# Patient Record
Sex: Male | Born: 1950 | Hispanic: Yes | Marital: Single | Smoking: Current every day smoker
Health system: Southern US, Community
[De-identification: ages and names within clinical notes are randomized; demographics above are authoritative.]

## PROBLEM LIST (undated history)

## (undated) DIAGNOSIS — E119 Type 2 diabetes mellitus without complications: Secondary | ICD-10-CM

## (undated) DIAGNOSIS — H539 Unspecified visual disturbance: Secondary | ICD-10-CM

## (undated) DIAGNOSIS — I10 Essential (primary) hypertension: Secondary | ICD-10-CM

## (undated) DIAGNOSIS — E785 Hyperlipidemia, unspecified: Secondary | ICD-10-CM

## (undated) DIAGNOSIS — G4730 Sleep apnea, unspecified: Secondary | ICD-10-CM

## (undated) DIAGNOSIS — E7800 Pure hypercholesterolemia, unspecified: Secondary | ICD-10-CM

## (undated) DIAGNOSIS — M069 Rheumatoid arthritis, unspecified: Secondary | ICD-10-CM

## (undated) DIAGNOSIS — M1990 Unspecified osteoarthritis, unspecified site: Secondary | ICD-10-CM

## (undated) DIAGNOSIS — G5600 Carpal tunnel syndrome, unspecified upper limb: Secondary | ICD-10-CM

## (undated) DIAGNOSIS — G7000 Myasthenia gravis without (acute) exacerbation: Secondary | ICD-10-CM

## (undated) HISTORY — DX: Type 2 diabetes mellitus without complications: E11.9

## (undated) HISTORY — DX: Unspecified visual disturbance: H53.9

## (undated) HISTORY — DX: Hyperlipidemia, unspecified: E78.5

## (undated) HISTORY — DX: Essential (primary) hypertension: I10

## (undated) HISTORY — PX: FRACTURE SURGERY: SHX138

## (undated) HISTORY — DX: Sleep apnea, unspecified: G47.30

## (undated) HISTORY — DX: Rheumatoid arthritis, unspecified: M06.9

## (undated) HISTORY — DX: Unspecified osteoarthritis, unspecified site: M19.90

## (undated) HISTORY — PX: KNEE SURGERY: SHX244

## (undated) HISTORY — DX: Myasthenia gravis without (acute) exacerbation: G70.00

## (undated) HISTORY — DX: Pure hypercholesterolemia, unspecified: E78.00

---

## 2004-04-03 ENCOUNTER — Ambulatory Visit: Admission: RE | Admit: 2004-04-03 | Payer: Self-pay | Source: Ambulatory Visit

## 2012-11-27 NOTE — Op Note (Unsigned)
DATE OF BIRTH:                        11/02/1951      ADMISSION DATE:                     04/03/2004            PATIENT LOCATION:                     [id number]            DATE OF PROCEDURE:                   04/03/2004      SURGEON:                            Verdell Carmine, MD      ASSISTANT(S):                  ANESTHESIOLOGIST:  Marylouise Stacks, M.D.            PREOPERATIVE DIAGNOSIS:  SCREENING COLONOSCOPY.            POSTOPERATIVE DIAGNOSES      1.   DIVERTICULOSIS.      2.   APPENDICEAL ORIFICE INFLAMMATION.            PROCEDURE:  COLONOSCOPY WITH BIOPSY OF APPENDICEAL ORIFICE.            ANESTHESIA:  IVA.            INDICATIONS FOR PROCEDURE:  This is a 62 year old black male who has been      counseled regarding screening colonoscopy.  The risks, complications, and      alternatives as well as miss rates of colonoscopy were explained to the      patient preoperatively.            DESCRIPTION OF PROCEDURE:  After appropriate cardiopulmonary and oxygen      saturation monitoring had been established the patient was placed in the      left lateral decubitus position and an Olympus pediatric fiberoptic      colonoscope was advanced to the level of the cecum with ease.  Prep was      adequate through most of the colon although a large amount of liquid prep      required aspiration.            The ileocecal valve was identified and the appendiceal orifice appeared to      be somewhat inflamed with some edema and friability of its orifice.  This      was biopsied in two areas to exclude the possibility of an inflamed sessile      polyp at the appendiceal orifice.  The scope was spirally withdrawn and the      remaining cecum, right colon, hepatic flexure, and transverse colon were      within normal limits.            In the proximal descending colon, scattered diverticula were encountered      which were seen throughout the remaining descending colon and rectosigmoid.      There was no evidence of diverticulitis or  luminal stricture.  The rectum      was within normal limits.  The anal canal had small internal hemorrhoids      which  were left undisturbed.            The patient tolerated the procedure well and was taken to the PACU in      stable condition.                                                ___________________________________          Date Signed: __________      Verdell Carmine, MD  ([id number])            D: 04/03/2004 by Verdell Carmine, MD      T: 04/04/2004 by [id number] ([id number]) ([id number])      cc:  Deanne Coffer, MD          Verdell Carmine, MD

## 2013-11-25 HISTORY — PX: PENILE PROSTHESIS IMPLANT: SHX240

## 2014-01-24 ENCOUNTER — Ambulatory Visit: Payer: Commercial Managed Care - POS | Admitting: Anesthesiology

## 2014-01-24 ENCOUNTER — Ambulatory Visit: Payer: Self-pay

## 2014-01-24 ENCOUNTER — Encounter: Payer: Self-pay | Admitting: Anesthesiology

## 2014-01-24 ENCOUNTER — Encounter
Admission: RE | Disposition: A | Discharge status: Home / Self Care | Payer: Self-pay | Source: Ambulatory Visit | Attending: Gastroenterology

## 2014-01-24 ENCOUNTER — Ambulatory Visit: Payer: Commercial Managed Care - POS | Admitting: Gastroenterology

## 2014-01-24 ENCOUNTER — Ambulatory Visit
Admission: RE | Admit: 2014-01-24 | Discharge: 2014-01-24 | Disposition: A | Discharge status: Home / Self Care | Payer: Commercial Managed Care - POS | Source: Ambulatory Visit | Attending: Gastroenterology | Admitting: Gastroenterology

## 2014-01-24 DIAGNOSIS — K2980 Duodenitis without bleeding: Secondary | ICD-10-CM | POA: Insufficient documentation

## 2014-01-24 DIAGNOSIS — K921 Melena: Secondary | ICD-10-CM | POA: Insufficient documentation

## 2014-01-24 DIAGNOSIS — K2960 Other gastritis without bleeding: Secondary | ICD-10-CM | POA: Insufficient documentation

## 2014-01-24 DIAGNOSIS — K5730 Diverticulosis of large intestine without perforation or abscess without bleeding: Secondary | ICD-10-CM | POA: Insufficient documentation

## 2014-01-24 DIAGNOSIS — K319 Disease of stomach and duodenum, unspecified: Secondary | ICD-10-CM

## 2014-01-24 DIAGNOSIS — K209 Esophagitis, unspecified without bleeding: Secondary | ICD-10-CM | POA: Insufficient documentation

## 2014-01-24 DIAGNOSIS — K648 Other hemorrhoids: Secondary | ICD-10-CM | POA: Insufficient documentation

## 2014-01-24 DIAGNOSIS — D649 Anemia, unspecified: Secondary | ICD-10-CM | POA: Insufficient documentation

## 2014-01-24 HISTORY — PX: EGD, COLONOSCOPY: SHX3799

## 2014-01-24 LAB — GLUCOSE WHOLE BLOOD - POCT: Whole Blood Glucose POCT: 150 mg/dL — ABNORMAL HIGH (ref 70–100)

## 2014-01-24 SURGERY — EGD, COLONOSCOPY
Anesthesia: Anesthesia MAC / Sedation | Site: Abdomen | Wound class: Clean Contaminated

## 2014-01-24 MED ORDER — PROPOFOL 10 MG/ML IV EMUL
INTRAVENOUS | Status: AC
Start: 2014-01-24 — End: ?
  Filled 2014-01-24: qty 20

## 2014-01-24 MED ORDER — PROMETHAZINE HCL 25 MG/ML IJ SOLN
6.2500 mg | Freq: Once | INTRAMUSCULAR | Status: DC | PRN
Start: 2014-01-24 — End: 2014-01-24

## 2014-01-24 MED ORDER — PROPOFOL 10 MG/ML IV EMUL
INTRAVENOUS | Status: AC
Start: 2014-01-24 — End: ?
  Filled 2014-01-24: qty 40

## 2014-01-24 MED ORDER — HYDROMORPHONE HCL PF 1 MG/ML IJ SOLN
0.5000 mg | INTRAMUSCULAR | Status: DC | PRN
Start: 2014-01-24 — End: 2014-01-24

## 2014-01-24 MED ORDER — ONDANSETRON HCL 4 MG/2ML IJ SOLN
4.0000 mg | Freq: Once | INTRAMUSCULAR | Status: DC | PRN
Start: 2014-01-24 — End: 2014-01-24

## 2014-01-24 MED ORDER — MEPERIDINE HCL 25 MG/ML IJ SOLN
12.5000 mg | Freq: Once | INTRAMUSCULAR | Status: DC | PRN
Start: 2014-01-24 — End: 2014-01-24

## 2014-01-24 MED ORDER — ESOMEPRAZOLE MAGNESIUM 20 MG PO CPDR
40.0000 mg | DELAYED_RELEASE_CAPSULE | Freq: Every morning | ORAL | Status: AC
Start: 2014-01-24 — End: ?

## 2014-01-24 MED ORDER — FENTANYL CITRATE 0.05 MG/ML IJ SOLN
INTRAMUSCULAR | Status: AC
Start: 2014-01-24 — End: ?
  Filled 2014-01-24: qty 2

## 2014-01-24 MED ORDER — OXYCODONE-ACETAMINOPHEN 5-325 MG PO TABS
1.0000 | ORAL_TABLET | Freq: Once | ORAL | Status: DC | PRN
Start: 2014-01-24 — End: 2014-01-24

## 2014-01-24 MED ORDER — SODIUM CHLORIDE 0.9 % IV SOLN
INTRAVENOUS | Status: DC
Start: 2014-01-24 — End: 2014-01-24

## 2014-01-24 MED ORDER — LIDOCAINE HCL 2 % IJ SOLN
INTRAMUSCULAR | Status: AC
Start: 2014-01-24 — End: ?
  Filled 2014-01-24: qty 20

## 2014-01-24 MED ORDER — FENTANYL CITRATE 0.05 MG/ML IJ SOLN
50.0000 ug | INTRAMUSCULAR | Status: DC | PRN
Start: 2014-01-24 — End: 2014-01-24

## 2014-01-24 MED ORDER — LIDOCAINE HCL 2 % IJ SOLN
INTRAMUSCULAR | Status: DC | PRN
Start: 2014-01-24 — End: 2014-01-24
  Administered 2014-01-24: 80 mg via INTRAVENOUS

## 2014-01-24 MED ORDER — FENTANYL CITRATE 0.05 MG/ML IJ SOLN
INTRAMUSCULAR | Status: DC | PRN
Start: 2014-01-24 — End: 2014-01-24
  Administered 2014-01-24 (×2): 50 ug via INTRAVENOUS

## 2014-01-24 MED ORDER — PROPOFOL INFUSION 10 MG/ML
INTRAVENOUS | Status: DC | PRN
Start: 2014-01-24 — End: 2014-01-24
  Administered 2014-01-24: 30 mg via INTRAVENOUS
  Administered 2014-01-24: 50 mg via INTRAVENOUS
  Administered 2014-01-24: 40 mg via INTRAVENOUS
  Administered 2014-01-24 (×4): 20 mg via INTRAVENOUS
  Administered 2014-01-24: 40 mg via INTRAVENOUS

## 2014-01-24 SURGICAL SUPPLY — 26 items
BLOCK BITE MAXI 60FR LF STRD STRAP SDPRT (Procedure Accessories) ×1
BLOCK BITE OD60 FR STURDY STRAP SIDEPORT (Procedure Accessories) ×1 IMPLANT
BLOCK BITE OD60 FR STURDY STRAP SIDEPORT DENTAL RETENTION RIM MAXI (Procedure Accessories) ×1 IMPLANT
FORCEPS BIOPSY L240 CM JUMBO MICROMESH (Instrument) IMPLANT
FORCEPS BIOPSY L240 CM JUMBO MICROMESH TEETH STREAMLINE CATHETER (Instrument) IMPLANT
FORCEPS BIOPSY L240 CM LARGE CAPACITY (Instrument) IMPLANT
FORCEPS BIOPSY L240 CM MICROMESH TEETH STREAMLINE CATHETER NEEDLE (Instrument) IMPLANT
FORCEPS BX SS JMB RJ 4 2.8MM 240CM STRL (Instrument)
FORCEPS BX SS LG CPC RJ 4 2.4MM 240CM (Instrument)
GLOVES EXAM NITRILE ETS LG NS (Glove) ×2 IMPLANT
GOWN ISL PP PE REG LG LF FULL BCK NK TIE (Gown) ×4 IMPLANT
GOWN ISOLATION REGULAR LARGE FULL BACK NECK TIE ELASTIC CUFF (Gown) ×2 IMPLANT
MASK FLUID SHIELD W WRAP (Personal Protection) ×4 IMPLANT
NEEDLE CARR-LOCKE INJECT 25GX5 (Needles) IMPLANT
PAD ELECTROSRG GRND REM W CRD (Procedure Accessories) IMPLANT
SNARE ESCP MIC CPTVTR 13MM 240IN STRL (GE Lab Supplies) IMPLANT
SNARE SMALL HEXAGON CAPTIVATOR STIFF ENDOSCOPIC POLYPECTOMY (GE Lab Supplies) IMPLANT
SPONGE GAUZE L4 IN X W4 IN 16 PLY (Dressing) ×1 IMPLANT
SPONGE GAUZE L4 IN X W4 IN 16 PLY MAXIMUM ABSORBENT USP TYPE VII (Dressing) ×1 IMPLANT
SPONGE GZE CTTN CRTY 4X4IN LF NS 16 PLY (Dressing) ×1
SYRINGE 50 ML GRADUATE NONPYROGENIC DEHP (Syringes, Needles) ×1 IMPLANT
SYRINGE 50 ML GRADUATE NONPYROGENIC DEHP FREE PVC FREE BD MEDICAL (Syringes, Needles) ×1 IMPLANT
SYRINGE MED 50ML LF STRL GRAD N-PYRG (Syringes, Needles) ×1
TRAP  MUCOUS SPECIMEN 40CC (Procedure Accessories) IMPLANT
WATER STERILE PLASTIC POUR BOTTLE 250 ML (Irrigation Solutions) ×1 IMPLANT
WATER STRL 250ML LF PLS PR BTL (Irrigation Solutions) ×1

## 2014-01-24 NOTE — Transfer of Care (Signed)
Anesthesia Transfer of Care Note    Patient: Caleb Koch    Procedures performed: Procedure(s) with comments:  EGD, COLONOSCOPY - EGD/Colonoscopy  Q1 = Unknown    Anesthesia type: General TIVA    Patient location:Phase II PACU    Post pain: Patient not complaining of pain, continue current therapy      Mental Status:awake and alert     Respiratory Function: tolerating room air    Cardiovascular: stable    Nausea/Vomiting: patient not complaining of nausea or vomiting    Hydration Status: adequate    Post assessment: no apparent anesthetic complications and no reportable events  Pt to PACU, breathing spontaneously, VSS, in NAD. Report to PACU RN.

## 2014-01-24 NOTE — H&P (Signed)
GI PRE PROCEDURE NOTE    Proceduralist Comments:   Review of Systems and Past Medical / Surgical History performed: Yes     Indications:anemia and Blood in stool    Previous Adverse Reaction to Anesthesia or Sedation (if yes, describe): No    Physical Exam / Laboratory Data (If applicable)   Airway Classification: Class II    General: Alert and cooperative  Lungs: Lungs clear to auscultation  Cardiac: RRR, normal S1S2.    Abdomen: Soft, non tender. Normal active bowel sounds  Other:     No labs drawn    American Society of Anesthesiologists (ASA) Physical Status Classification:   ASA 2 - Patient with mild systemic disease with no functional limitations    Planned Sedation:   Deep sedation with anesthesia    Attestation:   Caleb Koch has been reassessed immediately prior to the procedure and is an appropriate candidate for the planned sedation and procedure. Risks, benefits and alternatives to the planned procedure and sedation have been explained to the patient or guardian:  yes        Signed by: Dayton Martes

## 2014-01-24 NOTE — Anesthesia Preprocedure Evaluation (Signed)
Anesthesia Evaluation    AIRWAY    Mallampati: III    TM distance: >3 FB  Neck ROM: full  Mouth Opening:full   CARDIOVASCULAR    cardiovascular exam normal       DENTAL    No notable dental hx     PULMONARY    pulmonary exam normal     OTHER FINDINGS                      Anesthesia Plan    ASA 3     MAC               (Risks discussed including but not limited to:  neurological complications such as stroke,   cardiovascular complications such as heart attack,   pulmonary complications such as asthmatic attack,   intra-operative awareness, death, dental trauma, and allergic reaction.     Questions answered.     Pt understands and wishes to proceed.    Oralia Manis, MD    )      Detailed anesthesia plan: MAC        Post op pain management: per surgeon    informed consent obtained    Plan discussed with CRNA.    ECG reviewed  pertinent labs reviewed

## 2014-01-24 NOTE — OR PostOp (Signed)
Wife to bedside. Pt and wife reviewing d/c instructions. All questions answered.

## 2014-01-24 NOTE — Discharge Instructions (Signed)
EGD Discharge Instructions    General Instructions:  1. Following sedation, your judgement, perception, and coordination are considered impaired. Even though you may feel awake and alert, you are considered legally intoxicated. Therefore, until the next morning;   Do not Drive   Do not operate appliances or equipment that requires reaction time (e.g.    Stove, electrical tools, machinery)   Do not sign legal documents or be involved in important decisions.   Do not smoke if alone   Do not drink alcoholic beverages   Go directly home and rest for several hours before resuming your routine    activities.   It is highly recommended to have a responsible adult stay with you for the   next 24 hours    2. Tenderness, swelling or pain may occur at the IV site where you received sedation. If you experience this, apply warm soaks to the area. Notify your physician if this persists.    Instructions Specific To Procedures - Report To Physician Any Of The Following:    Upper Endoscopy, ERCP, Dilations   1. Pain in Chest   2. Nausea/vomitting   3. Fevers/Chills within 24 hours after procedure. Temp>101deg F   4. Severe and persistent abdominal pain and bloating     In Addition:   Mild throat soreness may follow this procedure. Warm salt water gargling or   lozenges of your choice will most likely relieve your discomfort or cold drinks and   popsicles.       Additional Discharge Instructions  Your diet after the procedure: {Gi diet instructions:110015}   NO RED FLUIDS, FOODS OR SAUCES FOR 24 HOURS.    Special Instructions: ***  Prescriptions given: {YES (DEF)/NO:21773}  Patient education literature given; {YES (DEF)/NO:21773}      If you have questions or problems contact your MD immediately. If you need immediate attention, call your MD, 911 and/or go to nearest emergency room.    Colonoscopy Discharge Instructions  General Instructions:       2. Tenderness, swelling or pain may occur at the IV site where you  received sedation. If you experience this, apply warm soaks to the area. Notify your physician if this persists.    Instructions Specific To Procedures - Report To Physician Any Of The Following:    Colon/Sigmoidoscopy/Proctoscopy   1. Severe and persistent abdominal pain/bloating which does not subside within 2-3 hours   2. Large amount of rectal bleeding (some mucosal blood streaking may occur, especially if biopsy or polypectomy was done or if hemorrhoids are present.   3. Nausea/vomitting   4. Fevers/Chills within 24 hours after procedure. Temp>101deg F     In Addition:   If polyp has been removed, DO NOT take aspirin or aspirin containing products (e.g. Anacin, Alka Seltzer, Bufferin, Etc.) or non-steroidal anti-inflammatory drugs (e.g. Advil, Motrin, etc.) for *** days unless otherwise advised by doctor. Tylenol  or extra Strength Tylenol is permitted.    Additional Discharge Instructions  Your diet after the procedure: {Gi diet instructions:110015}  Special Instructions: ***  Prescriptions given: {YES (DEF)/NO:21773}  Patient education literature given; {YES (DEF)/NO:21773}      If you have questions or problems contact your MD immediately. If you need immediate attention, call your MD, 911 and/or go to nearest emergency room.

## 2014-01-24 NOTE — OR Nursing (Signed)
Pt in Stage 2 PACU.

## 2014-01-25 ENCOUNTER — Encounter: Payer: Self-pay | Admitting: Gastroenterology

## 2014-01-31 ENCOUNTER — Encounter: Admission: RE | Payer: Self-pay | Source: Ambulatory Visit

## 2014-01-31 ENCOUNTER — Ambulatory Visit
Admission: RE | Admit: 2014-01-31 | Payer: Commercial Managed Care - POS | Source: Ambulatory Visit | Admitting: Gastroenterology

## 2014-01-31 SURGERY — EGD, COLONOSCOPY
Anesthesia: Anesthesia MAC / Sedation | Site: Abdomen

## 2014-02-16 NOTE — Anesthesia Postprocedure Evaluation (Signed)
Anesthesia Post Evaluation    Patient: Caleb Koch    Procedures performed: Procedure(s) with comments:  EGD, COLONOSCOPY - EGD/Colonoscopy  Q1 = Unknown    Anesthesia type: MAC    Patient location:Phase II PACU    Last vitals:   Filed Vitals:    01/24/14 1820   BP: 170/98   Pulse:    Temp:    Resp: 12   SpO2: 98%       Post pain: Patient not complaining of pain, continue current therapy      Mental Status:awake and alert     Respiratory Function: tolerating room air    Cardiovascular: stable    Nausea/Vomiting: patient not complaining of nausea or vomiting    Hydration Status: adequate    Post assessment: no apparent anesthetic complications and no reportable events

## 2016-06-29 ENCOUNTER — Ambulatory Visit (INDEPENDENT_AMBULATORY_CARE_PROVIDER_SITE_OTHER): Payer: 59 | Admitting: Emergency Medicine

## 2016-06-29 VITALS — BP 150/70 | HR 75 | Temp 98.9°F | Resp 17 | Ht 69.0 in | Wt 209.0 lb

## 2016-06-29 DIAGNOSIS — H02401 Unspecified ptosis of right eyelid: Secondary | ICD-10-CM

## 2016-06-29 DIAGNOSIS — E119 Type 2 diabetes mellitus without complications: Secondary | ICD-10-CM

## 2016-06-29 DIAGNOSIS — H5315 Visual distortions of shape and size: Secondary | ICD-10-CM

## 2016-06-29 DIAGNOSIS — Z1159 Encounter for screening for other viral diseases: Secondary | ICD-10-CM | POA: Diagnosis not present

## 2016-06-29 DIAGNOSIS — I10 Essential (primary) hypertension: Secondary | ICD-10-CM

## 2016-06-29 DIAGNOSIS — E785 Hyperlipidemia, unspecified: Secondary | ICD-10-CM

## 2016-06-29 LAB — POCT CBC
Granulocyte percent: 78.7 %G (ref 37–80)
HEMATOCRIT: 36.7 % — AB (ref 43.5–53.7)
Hemoglobin: 13.4 g/dL — AB (ref 14.1–18.1)
Lymph, poc: 1.4 (ref 0.6–3.4)
MCH: 34 pg — AB (ref 27–31.2)
MCHC: 36.4 g/dL — AB (ref 31.8–35.4)
MCV: 93.4 fL (ref 80–97)
MID (CBC): 0.6 (ref 0–0.9)
MPV: 7.5 fL (ref 0–99.8)
POC Granulocyte: 7.5 — AB (ref 2–6.9)
POC LYMPH PERCENT: 15 %L (ref 10–50)
POC MID %: 6.3 % (ref 0–12)
Platelet Count, POC: 206 10*3/uL (ref 142–424)
RBC: 3.93 M/uL — AB (ref 4.69–6.13)
RDW, POC: 12.5 %
WBC: 9.5 10*3/uL (ref 4.6–10.2)

## 2016-06-29 LAB — GLUCOSE, POCT (MANUAL RESULT ENTRY): POC Glucose: 104 mg/dl — AB (ref 70–99)

## 2016-06-29 LAB — COMPLETE METABOLIC PANEL WITH GFR
ALBUMIN: 4.6 g/dL (ref 3.6–5.1)
ALK PHOS: 68 U/L (ref 40–115)
ALT: 11 U/L (ref 9–46)
AST: 14 U/L (ref 10–35)
BILIRUBIN TOTAL: 0.4 mg/dL (ref 0.2–1.2)
BUN: 17 mg/dL (ref 7–25)
CO2: 23 mmol/L (ref 20–31)
Calcium: 9.8 mg/dL (ref 8.6–10.3)
Chloride: 106 mmol/L (ref 98–110)
Creat: 1.39 mg/dL — ABNORMAL HIGH (ref 0.70–1.25)
GFR, EST NON AFRICAN AMERICAN: 53 mL/min — AB (ref >=60)
GFR, Est African American: 61 mL/min (ref >=60)
GLUCOSE: 115 mg/dL — AB (ref 65–99)
Potassium: 4.2 mmol/L (ref 3.5–5.3)
SODIUM: 141 mmol/L (ref 135–146)
TOTAL PROTEIN: 7.5 g/dL (ref 6.1–8.1)

## 2016-06-29 LAB — POCT GLYCOSYLATED HEMOGLOBIN (HGB A1C): Hemoglobin A1C: 7.1

## 2016-06-29 MED ORDER — METFORMIN HCL 1000 MG PO TABS: 1000.0000 mg | ORAL_TABLET | Freq: Two times a day (BID) | ORAL | 3 refills | Status: DC

## 2016-06-29 MED ORDER — LOSARTAN POTASSIUM-HCTZ 100-12.5 MG PO TABS: 1.0000 | ORAL_TABLET | Freq: Every day | ORAL | 3 refills | Status: DC

## 2016-06-29 MED ORDER — ATORVASTATIN CALCIUM 20 MG PO TABS: 20.0000 mg | ORAL_TABLET | Freq: Every day | ORAL | 3 refills | Status: DC

## 2016-06-29 NOTE — Progress Notes (Signed)
By signing my name below, I, Stann Ore, attest that this documentation has been prepared under the direction and in the presence of Lesle Chris, MD. Electronically Signed: Stann Ore, Scribe. 06/29/2016 , 4:22 PM .  Patient was seen in room 3 .  Chief Complaint:  Chief Complaint  Patient presents with   Diabetes    recently moved to town & needs to est care with someone & needs a diabetic follow-up.   Blurred Vision    has seen eye doctor recently for same issue    HPI: Jonathan Mcgee is a 65 y.o. male who reports to [facility] today complaining of blurred vision that started 4-5 months ago. Patient noticed his right eye is slightly smaller than his left eye and started having blurry vision. He would clear it up slightly if he closes a single eye. With both eyes opened, he would have a slight "double vision". 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 He denies vascular injury to eye by diabetes or retinopathy. Patient was instructed to have MRI done. After 1~2 weeks, the blurry vision cleared up. However, recently, the blurry vision restarted again 1~2 weeks ago.   Patient is also looking to establish care and follow up on his diabetes. He recently moved into town from [place]. He takes metformin and losartan, but took his last dose of losartan about 3 weeks ago. He was previously on lisinopril but it caused side effects. He denies taking aspirin. He denies checking his sugar recently.   His partner is a carrier for hep B. He denies being intimate with her yet. He has concerns about this issue. He doesn't recall if he's received hep B immunizations. He was in the [employer] previously.   He had colonoscopy a few years ago.  He also had his prostate checked a year ago.   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  He smokes an occasional cigar.   Past Medical  History:  Diagnosis Date   Arthritis    Diabetes mellitus without complication (HCC)    Hypertension    Past Surgical History:  Procedure Laterality Date   renal implant     Social History   Social History   Marital status: Single    Spouse name: N/A   Number of children: N/A   Years of education: N/A   Social History Main Topics   Smoking status: Not on file   Smokeless tobacco: Not on file   Alcohol use Not on file   Drug use: Unknown   Sexual activity: Not on file   Other Topics Concern   Not on file   Social History Narrative   No narrative on file   Family History  Problem Relation Age of Onset   Diabetes Mother    Heart disease Mother    Hyperlipidemia Mother    Hyperlipidemia Father    Drug abuse Maternal Grandmother    Heart disease Maternal Grandmother    Hypertension Maternal Grandmother    Hyperlipidemia Maternal Grandmother    Allergies  Allergen Reactions   Lisinopril Swelling    Facial swelling   Prior to Admission medications   Medication Sig Start Date End Date Taking? Authorizing Provider  atorvastatin (LIPITOR) 20 MG tablet Take 20 mg by mouth daily.   Yes Historical Provider, MD  losartan-hydrochlorothiazide (HYZAAR) 100-12.5 MG tablet Take 1  tablet by mouth daily.   Yes Historical Provider, MD  metFORMIN (GLUCOPHAGE) 1000 MG tablet Take 1,000 mg by mouth 2 (two) times daily with a meal.   Yes Historical Provider, MD     ROS:  Constitutional: negative for fever, chills, night sweats, weight changes, or fatigue  HEENT: negative for vision changes, hearing loss, congestion, rhinorrhea, ST, epistaxis, or sinus pressure; positive for blurry vision Cardiovascular: negative for chest pain or palpitations Respiratory: negative for hemoptysis, wheezing, shortness of breath, or cough Abdominal: negative for abdominal pain, nausea, vomiting, diarrhea, or constipation Dermatological: negative for rash Neurologic: negative  for headache, dizziness, or syncope All other systems reviewed and are otherwise negative with the exception to those above and in the HPI.  PHYSICAL EXAM: Vitals:   06/29/16 1534  BP: (!) 150/70  Pulse: 75  Resp: 17  Temp: 98.9 F (37.2 C)   Body mass index is 30.86 kg/m.   General: Alert, no acute distress HEENT:  Normocephalic, atraumatic, oropharynx patent. Eye: EOMI, PEERLDC; ptosis of his right eyelid, bilateral cataracts Cardiovascular:  Regular rate and rhythm, no rubs murmurs or gallops.  radial pulse intact. No pedal edema. no carotid bruit Respiratory: Clear to auscultation bilaterally.  No wheezes, rales, or rhonchi.  No cyanosis, no use of accessory musculature Abdominal: No organomegaly, abdomen is soft and non-tender, positive bowel sounds. No masses. Musculoskeletal: Gait intact. No edema, tenderness; good distal pulses, no ulcerations of his feet Skin: No rashes. Neurologic: Facial musculature symmetric. Psychiatric: Patient acts appropriately throughout our interaction.  Lymphatic: No cervical or submandibular lymphadenopathy Genitourinary/Anorectal: No acute findings  LABS: Results for orders placed or performed in visit on 06/29/16  POCT CBC  Result Value Ref Range   WBC 9.5 4.6 - 10.2 K/uL   Lymph, poc 1.4 0.6 - 3.4   POC LYMPH PERCENT 15.0 10 - 50 %L   MID (cbc) 0.6 0 - 0.9   POC MID % 6.3 0 - 12 %M   POC Granulocyte 7.5 (A) 2 - 6.9   Granulocyte percent 78.7 37 - 80 %G   RBC 3.93 (A) 4.69 - 6.13 M/uL   Hemoglobin 13.4 (A) 14.1 - 18.1 g/dL   HCT, POC 95.1 (A) 88.4 - 53.7 %   MCV 93.4 80 - 97 fL   MCH, POC 34.0 (A) 27 - 31.2 pg   MCHC 36.4 (A) 31.8 - 35.4 g/dL   RDW, POC 16.6 %   Platelet Count, POC 206 142 - 424 K/uL   MPV 7.5 0 - 99.8 fL  POCT glycosylated hemoglobin (Hb A1C)  Result Value Ref Range   Hemoglobin A1C 7.1   POCT glucose (manual entry)  Result Value Ref Range   POC Glucose 104 (A) 70 - 99 mg/dl     EKG/XRAY:      ASSESSMENT/PLAN: I refilled his metformin, Lipitor,  and losartan. Referral made for CT of the head. He has a penile implant.. Referral made to Dr. Dione Booze ophthalmology.I personally performed the services described in this documentation, which was scribed in my presence. The recorded information has been reviewed and is accurate.  Gross sideeffects, risk and benefits, and alternatives of medications d/w patient. Patient is aware that all medications have potential sideeffects and we are unable to predict every sideeffect or drug-drug interaction that may occur.  Lesle Chris MD 06/29/2016 4:02 PM

## 2016-06-29 NOTE — Patient Instructions (Addendum)
We will call you with your MRI appointment.. We will call you with your appointment with Dr. Dione Booze. I would advise you take a baby aspirin daily.     IF you received an x-ray today, you will receive an invoice from [facility]. Please contact [facility] at [phone] with questions or concerns regarding your invoice.   IF you received labwork today, you will receive an invoice from [employer]. Please contact [facility] at [phone] with questions or concerns regarding your invoice.   Our billing staff will not be able to assist you with questions regarding bills from these companies.  You will be contacted with the lab results as soon as they are available. The fastest way to get your results is to activate your My Chart account. Instructions are located on the last page of this paperwork. If you have not heard from [place] regarding the results in 2 weeks, please contact this office.

## 2016-06-30 LAB — HEPATITIS B SURFACE ANTIBODY, QUANTITATIVE: Hepatitis B-Post: <5 m[IU]/mL

## 2016-06-30 LAB — HEPATITIS B SURFACE ANTIGEN: Hepatitis B Surface Ag: NEGATIVE

## 2016-06-30 LAB — TSH: TSH: 3.39 mIU/L (ref 0.40–4.50)

## 2016-06-30 LAB — HEPATITIS C ANTIBODY: HCV Ab: NEGATIVE

## 2016-07-04 ENCOUNTER — Ambulatory Visit
Admission: RE | Admit: 2016-07-04 | Discharge: 2016-07-04 | Disposition: A | Discharge status: Home / Self Care | Payer: 59 | Source: Ambulatory Visit | Attending: Emergency Medicine | Admitting: Emergency Medicine

## 2016-07-04 DIAGNOSIS — H5315 Visual distortions of shape and size: Secondary | ICD-10-CM

## 2016-07-04 DIAGNOSIS — H02401 Unspecified ptosis of right eyelid: Secondary | ICD-10-CM

## 2016-07-18 ENCOUNTER — Telehealth: Payer: Self-pay

## 2016-07-18 ENCOUNTER — Ambulatory Visit (INDEPENDENT_AMBULATORY_CARE_PROVIDER_SITE_OTHER): Payer: Managed Care, Other (non HMO) | Admitting: Physician Assistant

## 2016-07-18 DIAGNOSIS — Z23 Encounter for immunization: Secondary | ICD-10-CM | POA: Diagnosis not present

## 2016-07-18 NOTE — Telephone Encounter (Signed)
Can we resubmit this patients insurance information it wasn't put in correctly the last time, I re-entered it tonight when he came in.  Thanks!

## 2016-07-19 NOTE — Progress Notes (Signed)
Patient here for Hep B shot only per Dr. Cleta Alberts. Deliah Boston, MS, PA-C 5:55 AM, 07/19/2016

## 2016-07-24 LAB — HM DIABETES EYE EXAM

## 2016-07-26 ENCOUNTER — Other Ambulatory Visit: Payer: Self-pay | Admitting: Physician Assistant

## 2016-07-26 ENCOUNTER — Telehealth: Payer: Self-pay | Admitting: Emergency Medicine

## 2016-07-26 DIAGNOSIS — H02409 Unspecified ptosis of unspecified eyelid: Secondary | ICD-10-CM

## 2016-07-26 NOTE — Telephone Encounter (Signed)
Patient will be in tomorrow for lab work Future orders placed

## 2016-07-26 NOTE — Progress Notes (Signed)
Adding antibodies for myasthenia gravis per opthalmology reqs. Deliah Boston, MS, PA-C 8:17 AM, 07/26/2016

## 2016-07-27 ENCOUNTER — Ambulatory Visit (INDEPENDENT_AMBULATORY_CARE_PROVIDER_SITE_OTHER): Payer: Managed Care, Other (non HMO) | Admitting: Physician Assistant

## 2016-07-27 DIAGNOSIS — H02409 Unspecified ptosis of unspecified eyelid: Secondary | ICD-10-CM

## 2016-07-30 NOTE — Progress Notes (Signed)
Patient here for lab work only. Not seen.  Deliah Boston, MS, PA-C 8:59 AM, 07/30/2016

## 2016-08-04 LAB — ACETYLCHOLINE RECEPTOR, BLOCKING: ACHR Blocking Abs: 32 % inhibit — ABNORMAL HIGH (ref <15)

## 2016-08-04 LAB — ACETYLCHOLINE RECEPTOR, BINDING: A CHR BINDING ABS: 22.9 nmol/L — ABNORMAL HIGH (ref <=0.30)

## 2016-08-06 LAB — ACETYLCHOLINE RECEPTOR, MODULATING: Acetylchol Modul Ab: 86 % binding inhibition — ABNORMAL HIGH

## 2016-08-07 ENCOUNTER — Other Ambulatory Visit: Payer: Self-pay | Admitting: Physician Assistant

## 2016-08-07 DIAGNOSIS — G7000 Myasthenia gravis without (acute) exacerbation: Secondary | ICD-10-CM

## 2016-08-28 ENCOUNTER — Encounter: Payer: Self-pay | Admitting: Diagnostic Neuroimaging

## 2016-08-28 ENCOUNTER — Ambulatory Visit (INDEPENDENT_AMBULATORY_CARE_PROVIDER_SITE_OTHER): Payer: Managed Care, Other (non HMO) | Admitting: Diagnostic Neuroimaging

## 2016-08-28 VITALS — BP 148/74 | HR 58 | Ht 70.5 in | Wt 211.6 lb

## 2016-08-28 DIAGNOSIS — G7000 Myasthenia gravis without (acute) exacerbation: Secondary | ICD-10-CM | POA: Diagnosis not present

## 2016-08-28 DIAGNOSIS — G4733 Obstructive sleep apnea (adult) (pediatric): Secondary | ICD-10-CM

## 2016-08-28 MED ORDER — PYRIDOSTIGMINE BROMIDE 60 MG PO TABS: 30.0000 mg | ORAL_TABLET | Freq: Three times a day (TID) | ORAL | 6 refills | Status: DC

## 2016-08-28 NOTE — Patient Instructions (Addendum)
Thank you for coming to see us at [facility]. I hope we have been able to provide you high quality care today.  You may receive a patient satisfaction survey over the next few weeks. We would appreciate your feedback and comments so that we may continue to improve ourselves and the health of our patients.  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 'S GUIDE TO HAPPY AND HEALTHY LIVING These are some of my general health and wellness recommendations. Some of them may apply to you better than others. Please use common sense as you try these suggestions and feel free to ask me any questions.   ACTIVITY/FITNESS Mental, social, emotional and physical stimulation are very important for brain and body health. Try learning a new activity (arts, music, language, sports, games).  Keep moving your body to the best of your abilities. You can do this at home, inside or outside, the park, community center, gym or anywhere you like. Consider a physical therapist or personal trainer to get started. Consider the app Sworkit. Fitness trackers such as smart-watches, smart-phones or Fitbits can help as well.   NUTRITION Eat more plants: colorful vegetables, nuts, seeds and berries.  Eat less sugar, salt, preservatives and processed foods.  Avoid toxins such as cigarettes and alcohol.  Drink water when you are thirsty. Warm water with a slice of lemon is an excellent morning drink to start the day.  Consider these websites for more information The Nutrition Source (https://www.hsph.[employer].edu/nutritionsource) Precision Nutrition (www.precisionnutrition.com/blog/infographics)   RELAXATION Consider practicing mindfulness  meditation or other relaxation techniques such as deep breathing, prayer, yoga, tai chi, massage. See website mindful.org or the apps Headspace or Calm to help get started.   SLEEP Try to get at least 7-8+ hours sleep per day. Regular exercise and reduced caffeine will help you sleep better. Practice good sleep hygeine techniques. See website sleep.org for more information.   PLANNING Prepare estate planning, living will, healthcare POA documents. Sometimes this is best planned with the help of an attorney. Theconversationproject.org and agingwithdignity.org are excellent resources.  

## 2016-08-28 NOTE — Progress Notes (Signed)
[facility]  PATIENT: Jonathan Mcgee DOB: 11/27/1950  REFERRING CLINICIAN: Deliah Boston, PA-c HISTORY FROM: patient  REASON FOR VISIT: new consult    HISTORICAL  CHIEF COMPLAINT:  Chief Complaint  Patient presents with  . Diplopia  . Ptosis    HISTORY OF PRESENT ILLNESS:   65 year old ambidextrous male with hypertension, diabetes, hypercholesterolemia here for evaluation of myasthenia gravis. For past 1-2 years patient has had mild intermittent right-sided ptosis. In May 2017 he noticed onset of double vision, blurred vision, wavy vision. This is progressively worsened. For past 1 month he has had increasing fatigue with walking. Is having difficulty with upper and lower extremity strength. He denies any speech, swallowing, breathing difficulty. His balance has been slightly off.  Patient went to PCP, ophthalmology, had [facility] antibody testing which confirmed diagnosis of myasthenia gravis.     REVIEW OF SYSTEMS: Full 14 system review of systems performed and negative with exception of: Fatigue blurred vision double vision joint pain aching muscles weakness snoring. History of sleep apnea on CPAP more than 3 years ago. Not currently on therapy.    ALLERGIES: Allergies  Allergen Reactions  . Lisinopril Swelling    Facial swelling    HOME MEDICATIONS: Outpatient Medications Prior to Visit  Medication Sig Dispense Refill  . atorvastatin (LIPITOR) 20 MG tablet Take 1 tablet (20 mg total) by mouth daily. 90 tablet 3  . losartan-hydrochlorothiazide (HYZAAR) 100-12.5 MG tablet Take 1 tablet by mouth daily. 90 tablet 3  . metFORMIN (GLUCOPHAGE) 1000 MG tablet Take 1 tablet (1,000 mg total) by mouth 2 (two) times daily with a meal. 180 tablet 3   No facility-administered medications prior to visit.     PAST MEDICAL HISTORY: Past Medical History:  Diagnosis Date  . Arthritis   . Diabetes mellitus without complication (HCC)   . Hypercholesterolemia   .  Hypertension     PAST SURGICAL HISTORY: Past Surgical History:  Procedure Laterality Date  . KNEE SURGERY Right    fractured patella > 20 yrs ago  . PENILE PROSTHESIS IMPLANT  2015    FAMILY HISTORY: Family History  Problem Relation Age of Onset  . Diabetes Mother   . Heart disease Mother   . Hyperlipidemia Mother   . Heart attack Mother   . Hyperlipidemia Father   . Kidney failure Father   . Drug abuse Maternal Grandmother   . Heart disease Maternal Grandmother   . Hypertension Maternal Grandmother   . Hyperlipidemia Maternal Grandmother   . Diabetes Maternal Grandmother     SOCIAL HISTORY:  Social History   Social History  . Marital status: Single    Spouse name: N/A  . Number of children: 1  . Years of education: 29   Occupational History  .      [employer]   Social History Main Topics  . Smoking status: Current Some Day Smoker    Packs/day: 1.00    Types: Cigars  . Smokeless tobacco: Never Used  . Alcohol use Yes     Comment: occas  . Drug use: No  . Sexual activity: Not on file   Other Topics Concern  . Not on file   Social History Narrative  . No narrative on file     PHYSICAL EXAM  GENERAL EXAM/CONSTITUTIONAL: Vitals:  Vitals:   08/28/16 1458  BP: (!) 148/74  Pulse: (!) 58  Weight: 211 lb 9.6 oz (96 kg)  Height: 5' 10.5" (1.791 m)     Body mass  index is 29.93 kg/m.  Visual Acuity Screening   Right eye Left eye Both eyes  Without correction:     With correction: 20/40 20/30      Patient is in no distress; well developed, nourished and groomed; neck is supple  CARDIOVASCULAR:  Examination of carotid arteries is normal; no carotid bruits  Regular rate and rhythm, no murmurs  Examination of peripheral vascular system by observation and palpation is normal  EYES:  Ophthalmoscopic exam of optic discs and posterior segments is normal; no papilledema or hemorrhages  MUSCULOSKELETAL:  Gait, strength, tone, movements noted in  Neurologic exam below  NEUROLOGIC: MENTAL STATUS:  No flowsheet data found.  awake, alert, oriented to person, place and time  recent and remote memory intact  normal attention and concentration  language fluent, comprehension intact, naming intact,   fund of knowledge appropriate  CRANIAL NERVE:   2nd - no papilledema on fundoscopic exam  2nd, 3rd, 4th, 6th - pupils equal and reactive to light, visual fields full to confrontation, extraocular muscles --> DECR MOVEMENT OF RIGHT EYE IN ALL DIRECTIONS; SUBJECTIVE DOUBLE VISION IN ALL DIRECTIONS, WORSE ON RIGHT GAZE, RIGHT EYE PTOSIS; no nystagmus  5th - facial sensation symmetric  7th - facial strength symmetric  8th - hearing intact  9th - palate elevates symmetrically, uvula midline  11th - shoulder shrug symmetric  12th - tongue protrusion midline  MOTOR:   normal bulk and tone, full strength in the BUE, BLE; SUBJECTIVE WEAKNESS RIGHT TRICEPS AND RIGHT HIP FLEXION  ABLE TO COUNT TO 45 IN 1 BREATH  ABLE TO SIT-STAND X 10 WITH ARMS FOLDED; FEELS OUT OF BREATH; NO POST-EXERCISE INCREASE IN WEAKNESS IN LEGS  SENSORY:   normal and symmetric to light touch, temperature, vibration  COORDINATION:   finger-nose-finger, fine finger movements normal  REFLEXES:   deep tendon reflexes present and symmetric  GAIT/STATION:   narrow based gait; able to walk tandem; romberg is negative    DIAGNOSTIC DATA (LABS, IMAGING, TESTING) - I reviewed patient records, labs, notes, testing and imaging myself where available.  Lab Results  Component Value Date   WBC 9.5 06/29/2016   HGB 13.4 (A) 06/29/2016   HCT 36.7 (A) 06/29/2016   MCV 93.4 06/29/2016      Component Value Date/Time   NA 141 06/29/2016 1626   K 4.2 06/29/2016 1626   CL 106 06/29/2016 1626   CO2 23 06/29/2016 1626   GLUCOSE 115 (H) 06/29/2016 1626   BUN 17 06/29/2016 1626   CREATININE 1.39 (H) 06/29/2016 1626   CALCIUM 9.8 06/29/2016 1626   PROT  7.5 06/29/2016 1626   ALBUMIN 4.6 06/29/2016 1626   AST 14 06/29/2016 1626   ALT 11 06/29/2016 1626   ALKPHOS 68 06/29/2016 1626   BILITOT 0.4 06/29/2016 1626   GFRNONAA 53 (L) 06/29/2016 1626   GFRAA 61 06/29/2016 1626   No results found for: CHOL, HDL, LDLCALC, LDLDIRECT, TRIG, CHOLHDL Lab Results  Component Value Date   HGBA1C 7.1 06/29/2016   No results found for: VITAMINB12 Lab Results  Component Value Date   TSH 3.39 06/29/2016    07/04/16 CT head [I reviewed images myself and agree with interpretation. -VRP]  - Mild microvascular disease without acute intracranial process.     ASSESSMENT AND PLAN  65 y.o. year old male here with new onset diagnosis of myasthenia gravis with primarily ocular features but subtle generalized features as well.   Dx: ocular + generalized myasthenia gravis  1.  Myasthenia gravis (HCC)   2. OSA (obstructive sleep apnea)      PLAN: - CT chest (eval for thymoma) - pyridostigmine 30-60mg  TID - start IVIG course for current MG flare - refer to sleep consult for h/o sleep apnea (not on CPAP x 3 years)  Orders Placed This Encounter  Procedures  . CT CHEST W CONTRAST  . Ambulatory referral to Sleep Studies   Meds ordered this encounter  Medications  . pyridostigmine (MESTINON) 60 MG tablet    Sig: Take 0.5-1 tablets (30-60 mg total) by mouth 3 (three) times daily.    Dispense:  90 tablet    Refill:  6   Return in about 6 weeks (around 10/09/2016).  I reviewed images, labs, notes, records myself. I summarized findings and reviewed with patient, for this high risk condition (myasthenia gravis) requiring high complexity decision making.    Suanne Marker, MD 08/28/2016, 3:42 PM Certified in Neurology, Neurophysiology and Neuroimaging  [facility] [address] [place], [place] [address] [phone]

## 2016-09-03 ENCOUNTER — Ambulatory Visit
Admission: RE | Admit: 2016-09-03 | Discharge: 2016-09-03 | Disposition: A | Discharge status: Home / Self Care | Payer: Managed Care, Other (non HMO) | Source: Ambulatory Visit | Attending: Diagnostic Neuroimaging | Admitting: Diagnostic Neuroimaging

## 2016-09-03 DIAGNOSIS — G7000 Myasthenia gravis without (acute) exacerbation: Secondary | ICD-10-CM

## 2016-09-03 MED ORDER — IOPAMIDOL (ISOVUE-300) INJECTION 61%
75.0000 mL | Freq: Once | INTRAVENOUS | Status: AC | PRN
  Administered 2016-09-03: 75 mL via INTRAVENOUS

## 2016-09-05 ENCOUNTER — Telehealth: Payer: Self-pay | Admitting: *Deleted

## 2016-09-05 NOTE — Telephone Encounter (Signed)
-----   Message from Suanne Marker, MD sent at 09/04/2016  5:08 PM EDT ----- No thymoma found. Continue current plan. -VRP

## 2016-09-05 NOTE — Telephone Encounter (Signed)
Called and spoke to patient about CT chest results per Dr Marjory Lies note. He verbalized understanding.    Pt wanted to know where he was in process of scheduling IVIG infusion and if his insurance approved this. He stated it was okay to wait until Ambrose Pancoast, RN back on Monday. He stated "I am in no rush to get that done". Advised I will send message to Dr Marjory Lies also to make him aware. He verbalized understanding.   I spoke with Inetta Fermo in intrafusion. She submitted his information on 08/30/16. Still awaiting insurance approval. Might be another week or two before an answer if back from insurance.  I called patient back and relayed message above. He verbalized understanding.

## 2016-09-17 ENCOUNTER — Ambulatory Visit (INDEPENDENT_AMBULATORY_CARE_PROVIDER_SITE_OTHER): Payer: Managed Care, Other (non HMO) | Admitting: Neurology

## 2016-09-17 ENCOUNTER — Encounter: Payer: Self-pay | Admitting: Neurology

## 2016-09-17 ENCOUNTER — Telehealth: Payer: Self-pay

## 2016-09-17 VITALS — BP 152/60 | HR 70 | Resp 16 | Ht 70.5 in | Wt 216.0 lb

## 2016-09-17 DIAGNOSIS — M7989 Other specified soft tissue disorders: Secondary | ICD-10-CM | POA: Diagnosis not present

## 2016-09-17 DIAGNOSIS — G4733 Obstructive sleep apnea (adult) (pediatric): Secondary | ICD-10-CM | POA: Diagnosis not present

## 2016-09-17 DIAGNOSIS — G7000 Myasthenia gravis without (acute) exacerbation: Secondary | ICD-10-CM | POA: Diagnosis not present

## 2016-09-17 NOTE — Patient Instructions (Signed)

## 2016-09-17 NOTE — Progress Notes (Signed)
Subjective:    Patient ID: Jonathan Mcgee is a 65 y.o. male.  HPI     Jonathan Foley, MD, PhD [facility] [address] [address] [place], [place] [address]  Dear Satira Sark,   I saw your patient, Jonathan Mcgee, upon your kind request, in my clinic today for initial consultation of his sleep disorder, in particular, concern for underlying obstructive sleep apnea. The patient is unaccompanied today. As you know, Jonathan Mcgee is a 65 year old right-handed gentleman with an underlying medical history of diabetes, hypertension, hyperlipidemia, smoking, arthritis, obesity, and recent diagnosis of myasthenia gravis, who reports snoring, EDS and witnessed apneas, carries a prior diagnosis of OSA and was on CPAP therapy. His Epworth sleepiness score is 11 out of 24 today, his fatigue score is 28 out of 63. I reviewed your office note from 08/28/2016.  He was previously diagnosed with obstructive sleep apnea several years ago. [place] some 6[place]7 years ago. Study results are not available for my review today. He has not been on CPAP therapy in about 3 years. He has a bedtime of 11 PM, and wake time around 4:30 to 6:30.  He works for a [employer], [employer]. Lives alone, one son in [place].Patient is divorced. No RLS, no PLMs. He drinks alcohol occasionally, drinks 2-3 cups of coffee per day, smokes 1 cigar per day, not a cigarette smoker. He used to weigh more when he had his sleep study done several years ago, around 250 pounds. Snoring is loud and has apneas. Had TE as a child. No FHx of OSA. No nightly nocturia. No pets, has TV in bedroom, prefers the noise.    His Past Medical History Is Significant For: Past Medical History:  Diagnosis Date  . Arthritis   . Diabetes mellitus without complication (HCC)   . Hypercholesterolemia   . Hypertension   . Myasthenia gravis (HCC)     His Past Surgical History Is Significant For: Past Surgical History:   Procedure Laterality Date  . KNEE SURGERY Right    fractured patella > 20 yrs ago  . PENILE PROSTHESIS IMPLANT  2015    His Family History Is Significant For: Family History  Problem Relation Age of Onset  . Diabetes Mother   . Heart disease Mother   . Hyperlipidemia Mother   . Heart attack Mother   . Hyperlipidemia Father   . Kidney failure Father   . Drug abuse Maternal Grandmother   . Heart disease Maternal Grandmother   . Hypertension Maternal Grandmother   . Hyperlipidemia Maternal Grandmother   . Diabetes Maternal Grandmother     His Social History Is Significant For: Social History   Social History  . Marital status: Single    Spouse name: N/A  . Number of children: 1  . Years of education: 25   Occupational History  .      [employer]   Social History Main Topics  . Smoking status: Current Some Day Smoker    Packs/day: 1.00    Types: Cigars  . Smokeless tobacco: Never Used  . Alcohol use Yes     Comment: occas  . Drug use: No  . Sexual activity: Not Asked   Other Topics Concern  . None   Social History Narrative   Drinks 2-3 caffeine drinks a day     His Allergies Are:  Allergies  Allergen Reactions  . Lisinopril Swelling    Facial swelling  :   His Current  Medications Are:  Outpatient Encounter Prescriptions as of 09/17/2016  Medication Sig  . atorvastatin (LIPITOR) 20 MG tablet Take 1 tablet (20 mg total) by mouth daily.  Marland Kitchen losartan-hydrochlorothiazide (HYZAAR) 100-12.5 MG tablet Take 1 tablet by mouth daily.  . metFORMIN (GLUCOPHAGE) 1000 MG tablet Take 1 tablet (1,000 mg total) by mouth 2 (two) times daily with a meal.  . pyridostigmine (MESTINON) 60 MG tablet Take 0.5-1 tablets (30-60 mg total) by mouth 3 (three) times daily.   No facility-administered encounter medications on file as of 09/17/2016.   :  Review of Systems:  Out of a complete 14 point review of systems, all are reviewed and negative with the exception of these symptoms as  listed below: Review of Systems  Neurological:       Patient had a sleep study about 6-7 years ago. He does not have a copy of this. He was placed on CPAP. His machine stopped working 3 years ago, and has not used CPAP since.  Snoring, witnessed apnea, daytime fatigue    Epworth Sleepiness Scale 0= would never doze 1= slight chance of dozing 2= moderate chance of dozing 3= high chance of dozing  Sitting and reading:2 Watching TV:2 Sitting inactive in a public place (ex. Theater or meeting):1 As a passenger in a car for an hour without a break:1 Lying down to rest in the afternoon:3 Sitting and talking to someone:1 Sitting quietly after lunch (no alcohol):1 In a car, while stopped in traffic:0 Total:11  Objective:  Neurologic Exam  Physical Exam Physical Examination:   Vitals:   09/17/16 1550  BP: (!) 152/60  Pulse: 70  Resp: 16   General Examination: The patient is a very pleasant 65 y.o. male in no acute distress. He appears well-developed and well-nourished and well groomed.   HEENT: Normocephalic, atraumatic, pupils are equal, round and reactive to light and accommodation. Funduscopic exam is normal with sharp disc margins noted. Extraocular tracking is good without limitation to gaze excursion or nystagmus noted. Normal smooth pursuit is noted. Hearing is grossly intact. Bilateral proptosis is noted, right more than left, otherwise normal facial animation is noted, normal facial sensation. Speech is clear with no dysarthria noted. There is no hypophonia. There is no lip, neck/head, jaw or voice tremor. Neck is supple with full range of passive and active motion. There are no carotid bruits on auscultation. Oropharynx exam reveals: mild mouth dryness, adequate dental hygiene and moderate airway crowding, due to redundant soft palate and large uvula, tonsils are absent, tongue is normal in size. Mallampati is class III. Tongue protrudes centrally and palate elevates  symmetrically. Tonsils are absent. Neck size is 17 inches. He has a Mild overbite.   Chest: Clear to auscultation without wheezing, rhonchi or crackles noted.  Heart: S1+S2+0, regular and normal without murmurs, rubs or gallops noted.   Abdomen: Soft, non-tender and non-distended with normal bowel sounds appreciated on auscultation.  Extremities: There is 2+ pitting edema in the distal lower extremities bilaterally. Pedal pulses are intact.  Skin: Warm and dry without trophic changes noted. There are no varicose veins.  Musculoskeletal: exam reveals no obvious joint deformities, tenderness or joint swelling or erythema.   Neurologically:  Mental status: The patient is awake, alert and oriented in all 4 spheres. His immediate and remote memory, attention, language skills and fund of knowledge are appropriate. There is no evidence of aphasia, agnosia, apraxia or anomia. Speech is clear with normal prosody and enunciation. Thought process is linear. Mood is  normal and affect is normal.  Cranial nerves II - XII are as described above under HEENT exam. In addition: shoulder shrug is normal with equal shoulder height noted. Motor exam: Normal bulk, strength and tone is noted. There is no drift, tremor or rebound. Romberg is negative. Reflexes are 2+ throughout. Fine motor skills and coordination: intact with normal finger taps, normal hand movements, normal rapid alternating patting, normal foot taps and normal foot agility.  Cerebellar testing: No dysmetria or intention tremor on finger to nose testing. Heel to shin is unremarkable bilaterally. There is no truncal or gait ataxia.  Sensory exam: intact to light touch, pinprick, vibration, temperature sense in the upper and lower extremities.  Gait, station and balance: He stands easily. No veering to one side is noted. No leaning to one side is noted. Posture is age-appropriate and stance is narrow based. Gait shows normal stride length and normal  pace. No problems turning are noted. Tandem walk is difficult for him.   Assessment and Plan:   In summary, Jonathan Mcgee is a very pleasant 65 y.o.-year old male with an underlying medical history of diabetes, hypertension, hyperlipidemia, smoking, arthritis, obesity, and recent diagnosis of myasthenia gravis, whose history and physical exam are in keeping with ostructive sleep apnea (OSA). I had a long chat with the patient about my findings and the diagnosis of OSA, its prognosis and treatment options. We talked about medical treatments, surgical interventions and non-pharmacological approaches. I explained in particular the risks and ramifications of untreated moderate to severe OSA, especially with respect to developing cardiovascular disease down the Road, including congestive heart failure, difficult to treat hypertension, cardiac arrhythmias, or stroke. Even type 2 diabetes has, in part, been linked to untreated OSA. Symptoms of untreated OSA include daytime sleepiness, memory problems, mood irritability and mood disorder such as depression and anxiety, lack of energy, as well as recurrent headaches, especially morning headaches. We talked about trying to maintain a healthy lifestyle in general, as well as the importance of weight control. I encouraged the patient to eat healthy, exercise daily and keep well hydrated, to keep a scheduled bedtime and wake time routine, to not skip any meals and eat healthy snacks in between meals. I advised the patient not to drive when feeling sleepy. I noted significant lower extremity swelling today. He does not seem to be aware of this. He is advised to bring this up with his primary care physician next month, as he indicated an appointment next month.  I recommended the following at this time: sleep study with potential positive airway pressure titration. (We will score hypopneas at 4% and split the sleep study into diagnostic and treatment portion, if the  estimated. 2 hour AHI is >15/h).   I explained the sleep test procedure to the patient and also outlined possible surgical and non-surgical treatment options of OSA, including the use of a custom-made dental device (which would require a referral to a specialist dentist or oral surgeon), upper airway surgical options, such as pillar implants, radiofrequency surgery, tongue base surgery, and UPPP (which would involve a referral to an ENT surgeon). Rarely, jaw surgery such as mandibular advancement may be considered. Of note, given his myasthenia gravis he will likely not be a good surgical candidate.  I also explained the CPAP treatment option to the patient, who indicated that he would be willing to try CPAP if the need arises. I explained the importance of being compliant with PAP treatment, not only for insurance purposes  but primarily to improve His symptoms, and for the patient's long term health benefit, including to reduce His cardiovascular risks. I answered all his questions today and the patient was in agreement. I would like to see him back after the sleep study is completed and encouraged him to call with any interim questions, concerns, problems or updates.   Thank you very much for allowing me to participate in the care of this nice patient. If I can be of any further assistance to you please do not hesitate to talk to me.  Sincerely,   Jonathan Foley, MD, PhD

## 2016-09-17 NOTE — Telephone Encounter (Signed)
Patient is here today seeing Dr. Frances Furbish for sleep apnea. He states that he received a notice from the insurance company that his IVIG was denied. He would like to know what the next step is?

## 2016-09-18 NOTE — Telephone Encounter (Signed)
Spoke with patient yesterday, in this office,  after he saw another provider in this office. Advised him this RN would discuss with Inetta Fermo, infusion RN on Wed and call him. Spoke today with Inetta Fermo who stated appeal for PA is in process with his insurance. She will call him as soon as it is approved.  LVM for patient informing him of above. Advised he will get a call from [facility] when medication is approved. Left name, and number for any questions.

## 2016-10-09 ENCOUNTER — Ambulatory Visit (INDEPENDENT_AMBULATORY_CARE_PROVIDER_SITE_OTHER): Payer: Managed Care, Other (non HMO) | Admitting: Diagnostic Neuroimaging

## 2016-10-09 ENCOUNTER — Encounter: Payer: Self-pay | Admitting: Diagnostic Neuroimaging

## 2016-10-09 VITALS — BP 146/67 | HR 62 | Wt 213.0 lb

## 2016-10-09 DIAGNOSIS — R471 Dysarthria and anarthria: Secondary | ICD-10-CM | POA: Diagnosis not present

## 2016-10-09 DIAGNOSIS — G7000 Myasthenia gravis without (acute) exacerbation: Secondary | ICD-10-CM

## 2016-10-09 DIAGNOSIS — R252 Cramp and spasm: Secondary | ICD-10-CM | POA: Diagnosis not present

## 2016-10-09 NOTE — Progress Notes (Signed)
[facility]  PATIENT: Jonathan Mcgee DOB: Apr 22, 1951  REFERRING CLINICIAN: Deliah Boston, PA-c HISTORY FROM: patient  REASON FOR VISIT: follow up     HISTORICAL  CHIEF COMPLAINT:  Chief Complaint  Patient presents with  . Myasthenia Gravis    rm 6, "ever since starting medicine I can't speak or walk, get Charlie Horses/cramps, my feet/fingers get stiff"   . Follow-up    6 week    HISTORY OF PRESENT ILLNESS:   UPDATE 10/09/16: Since last visit, double vision is slightly better on mestinon 60mg  TID. However having more muscle cramps, excess saliva production. Fatigable weakness is persistent.   PRIOR HPI (08/28/16): 65 year old ambidextrous male with hypertension, diabetes, hypercholesterolemia here for evaluation of myasthenia gravis. For past 1-2 years patient has had mild intermittent right-sided ptosis. In May 2017 he noticed onset of double vision, blurred vision, wavy vision. This is progressively worsened. For past 1 month he has had increasing fatigue with walking. Is having difficulty with upper and lower extremity strength. He denies any speech, swallowing, breathing difficulty. His balance has been slightly off. Patient went to PCP, ophthalmology, had [facility] antibody testing which confirmed diagnosis of myasthenia gravis.   REVIEW OF SYSTEMS: Full 14 system review of systems performed and negative with exception of: only as per HPI.   ALLERGIES: Allergies  Allergen Reactions  . Lisinopril Swelling    Facial swelling    HOME MEDICATIONS: Outpatient Medications Prior to Visit  Medication Sig Dispense Refill  . atorvastatin (LIPITOR) 20 MG tablet Take 1 tablet (20 mg total) by mouth daily. 90 tablet 3  . losartan-hydrochlorothiazide (HYZAAR) 100-12.5 MG tablet Take 1 tablet by mouth daily. 90 tablet 3  . metFORMIN (GLUCOPHAGE) 1000 MG tablet Take 1 tablet (1,000 mg total) by mouth 2 (two) times daily with a meal. 180 tablet 3  . pyridostigmine  (MESTINON) 60 MG tablet Take 0.5-1 tablets (30-60 mg total) by mouth 3 (three) times daily. 90 tablet 6   No facility-administered medications prior to visit.     PAST MEDICAL HISTORY: Past Medical History:  Diagnosis Date  . Arthritis   . Diabetes mellitus without complication (HCC)   . Hypercholesterolemia   . Hypertension   . Myasthenia gravis (HCC)     PAST SURGICAL HISTORY: Past Surgical History:  Procedure Laterality Date  . KNEE SURGERY Right    fractured patella > 20 yrs ago  . PENILE PROSTHESIS IMPLANT  2015    FAMILY HISTORY: Family History  Problem Relation Age of Onset  . Diabetes Mother   . Heart disease Mother   . Hyperlipidemia Mother   . Heart attack Mother   . Hyperlipidemia Father   . Kidney failure Father   . Drug abuse Maternal Grandmother   . Heart disease Maternal Grandmother   . Hypertension Maternal Grandmother   . Hyperlipidemia Maternal Grandmother   . Diabetes Maternal Grandmother     SOCIAL HISTORY:  Social History   Social History  . Marital status: Single    Spouse name: N/A  . Number of children: 1  . Years of education: 66   Occupational History  .      [employer]   Social History Main Topics  . Smoking status: Current Some Day Smoker    Packs/day: 1.00    Types: Cigars  . Smokeless tobacco: Never Used  . Alcohol use Yes     Comment: occas  . Drug use: No  . Sexual activity: Not on file   Other  Topics Concern  . Not on file   Social History Narrative   Drinks 2-3 caffeine drinks a day      PHYSICAL EXAM  GENERAL EXAM/CONSTITUTIONAL: Vitals:  Vitals:   10/09/16 1522  BP: (!) 146/67  Pulse: 62  Weight: 213 lb (96.6 kg)   Body mass index is 30.13 kg/m. No exam data present  Patient is in no distress; well developed, nourished and groomed; neck is supple  CARDIOVASCULAR:  Examination of carotid arteries is normal; no carotid bruits  Regular rate and rhythm, no murmurs  Examination of peripheral vascular  system by observation and palpation is normal  EYES:  Ophthalmoscopic exam of optic discs and posterior segments is normal; no papilledema or hemorrhages  MUSCULOSKELETAL:  Gait, strength, tone, movements noted in Neurologic exam below  NEUROLOGIC: MENTAL STATUS:  No flowsheet data found.  awake, alert, oriented to person, place and time  recent and remote memory intact  normal attention and concentration  language fluent, comprehension intact, naming intact,   fund of knowledge appropriate  CRANIAL NERVE:   2nd - no papilledema on fundoscopic exam  2nd, 3rd, 4th, 6th - pupils equal and reactive to light, visual fields full to confrontation, extraocular muscles INTACT; TRACE RIGHT EYE PTOSIS; no nystagmus  5th - facial sensation symmetric  7th - facial strength symmetric  8th - hearing intact  9th - palate elevates symmetrically, uvula midline  11th - shoulder shrug symmetric  12th - tongue protrusion midline  MILD DYSARTRHIA  MOTOR:   normal bulk and tone, full strength in the BUE, BLE; EXCEPT SUBTLE RIGHT HIP FLEXION WEAKNESS  SENSORY:   normal and symmetric to light touch, temperature, vibration  COORDINATION:   finger-nose-finger, fine finger movements normal  REFLEXES:   deep tendon reflexes present and symmetric  GAIT/STATION:   narrow based gait    DIAGNOSTIC DATA (LABS, IMAGING, TESTING) - I reviewed patient records, labs, notes, testing and imaging myself where available.  Lab Results  Component Value Date   WBC 9.5 06/29/2016   HGB 13.4 (A) 06/29/2016   HCT 36.7 (A) 06/29/2016   MCV 93.4 06/29/2016      Component Value Date/Time   NA 141 06/29/2016 1626   K 4.2 06/29/2016 1626   CL 106 06/29/2016 1626   CO2 23 06/29/2016 1626   GLUCOSE 115 (H) 06/29/2016 1626   BUN 17 06/29/2016 1626   CREATININE 1.39 (H) 06/29/2016 1626   CALCIUM 9.8 06/29/2016 1626   PROT 7.5 06/29/2016 1626   ALBUMIN 4.6 06/29/2016 1626   AST 14  06/29/2016 1626   ALT 11 06/29/2016 1626   ALKPHOS 68 06/29/2016 1626   BILITOT 0.4 06/29/2016 1626   GFRNONAA 53 (L) 06/29/2016 1626   GFRAA 61 06/29/2016 1626   No results found for: CHOL, HDL, LDLCALC, LDLDIRECT, TRIG, CHOLHDL Lab Results  Component Value Date   HGBA1C 7.1 06/29/2016   No results found for: VITAMINB12 Lab Results  Component Value Date   TSH 3.39 06/29/2016    07/04/16 CT head [I reviewed images myself and agree with interpretation. -[facility]P]  - Mild microvascular disease without acute intracranial process.  09/04/16 CT chest  1. No mediastinal mass or adenopathy. 2. No infiltrate or pulmonary edema. 3. Degenerative changes thoracic spine. 4. Atherosclerotic calcifications of thoracic aorta. 5. Mild fatty infiltration of the liver.     ASSESSMENT AND PLAN  65 y.o. year old male here with new onset diagnosis of myasthenia gravis with ocular and generalized features. Having  side effects from mestinon, but also some improvement in ocular symptoms.   Dx: ocular + generalized myasthenia gravis  1. Myasthenia gravis (HCC)   2. Dysarthria   3. Muscle cramps      PLAN: - pyridostigmine 30-60mg  TID - start IVIG course for current MG flare; then may consider prednisone, imuran or other immunosuppressant medication - follow up sleep study  Return in about 2 months (around 12/09/2016).   Suanne Marker, MD 10/09/2016, 4:01 PM Certified in Neurology, Neurophysiology and Neuroimaging  [facility] [address], [address] [place], [place] [address] [phone]

## 2016-10-10 ENCOUNTER — Telehealth: Payer: Self-pay | Admitting: *Deleted

## 2016-10-10 LAB — CBC WITH DIFFERENTIAL/PLATELET
BASOS: 0 %
Basophils Absolute: 0 10*3/uL (ref 0.0–0.2)
EOS (ABSOLUTE): 0.5 10*3/uL — ABNORMAL HIGH (ref 0.0–0.4)
EOS: 6 %
HEMATOCRIT: 37 % — AB (ref 37.5–51.0)
Hemoglobin: 12.8 g/dL (ref 12.6–17.7)
IMMATURE GRANS (ABS): 0 10*3/uL (ref 0.0–0.1)
IMMATURE GRANULOCYTES: 0 %
LYMPHS: 22 %
Lymphocytes Absolute: 1.9 10*3/uL (ref 0.7–3.1)
MCH: 32.2 pg (ref 26.6–33.0)
MCHC: 34.6 g/dL (ref 31.5–35.7)
MCV: 93 fL (ref 79–97)
MONOS ABS: 0.8 10*3/uL (ref 0.1–0.9)
Monocytes: 9 %
NEUTROS PCT: 63 %
Neutrophils Absolute: 5.3 10*3/uL (ref 1.4–7.0)
PLATELETS: 251 10*3/uL (ref 150–379)
RBC: 3.97 x10E6/uL — AB (ref 4.14–5.80)
RDW: 12.5 % (ref 12.3–15.4)
WBC: 8.6 10*3/uL (ref 3.4–10.8)

## 2016-10-10 LAB — COMPREHENSIVE METABOLIC PANEL
ALT: 14 IU/L (ref 0–44)
AST: 15 IU/L (ref 0–40)
Albumin/Globulin Ratio: 1.6 (ref 1.2–2.2)
Albumin: 4.6 g/dL (ref 3.6–4.8)
Alkaline Phosphatase: 94 IU/L (ref 39–117)
BUN/Creatinine Ratio: 15 (ref 10–24)
BUN: 22 mg/dL (ref 8–27)
Bilirubin Total: 0.2 mg/dL (ref 0.0–1.2)
CALCIUM: 9.5 mg/dL (ref 8.6–10.2)
CO2: 24 mmol/L (ref 18–29)
CREATININE: 1.43 mg/dL — AB (ref 0.76–1.27)
Chloride: 102 mmol/L (ref 96–106)
GFR, EST AFRICAN AMERICAN: 59 mL/min/{1.73_m2} — AB (ref >59)
GFR, EST NON AFRICAN AMERICAN: 51 mL/min/{1.73_m2} — AB (ref >59)
GLOBULIN, TOTAL: 2.9 g/dL (ref 1.5–4.5)
Glucose: 122 mg/dL — ABNORMAL HIGH (ref 65–99)
Potassium: 4.6 mmol/L (ref 3.5–5.2)
Sodium: 142 mmol/L (ref 134–144)
TOTAL PROTEIN: 7.5 g/dL (ref 6.0–8.5)

## 2016-10-10 NOTE — Telephone Encounter (Signed)
Per Dr Marjory Lies , spoke with patient and informed him that his kidney function is slightly reduced but stable. Dr Marjory Lies will continue with his current plan for IVIG.  Patient verbalized concern over his kidney function stating that his father died from kidney failure. This RN advised him that Dr Marjory Lies will keep close check on his labs, kidney function while he is taking IVIG. Advised that dehydration and some medications can have an affect on his kidney function as well. Advised he can discuss this possibility with his pharmacist.  Patient verbalized understanding, appreciation.

## 2016-10-21 ENCOUNTER — Encounter: Payer: Self-pay | Admitting: Diagnostic Neuroimaging

## 2016-10-21 ENCOUNTER — Encounter: Payer: Self-pay | Admitting: *Deleted

## 2016-10-25 ENCOUNTER — Ambulatory Visit (INDEPENDENT_AMBULATORY_CARE_PROVIDER_SITE_OTHER): Payer: Managed Care, Other (non HMO) | Admitting: Neurology

## 2016-10-25 DIAGNOSIS — G4733 Obstructive sleep apnea (adult) (pediatric): Secondary | ICD-10-CM

## 2016-10-25 DIAGNOSIS — G4761 Periodic limb movement disorder: Secondary | ICD-10-CM

## 2016-10-25 DIAGNOSIS — G4720 Circadian rhythm sleep disorder, unspecified type: Secondary | ICD-10-CM

## 2016-11-01 NOTE — Progress Notes (Signed)
Patient referred by Dr. Marjory Lies, seen by me on 09/17/16, diagnostic PSG on 10/25/16.    Please call and notify the patient that the recent sleep study did confirm the diagnosis of moderate to severe obstructive sleep apnea with severe desaturations in REM sleep, lowest O2 at 71%, and that I recommend treatment for this in the form of CPAP. This will require a repeat sleep study for proper titration and mask fitting. Please explain to patient and arrange for a CPAP titration study. I have placed an order in the chart. Thanks, and please route to [facility] for scheduling next sleep study.  Huston Foley, MD, PhD [facility])

## 2016-11-01 NOTE — Procedures (Signed)
PATIENT'S NAME:  Jonathan Mcgee, Jonathan Mcgee DOB:      May 05, 1951      MR#:    [id number]     DATE OF RECORDING: 10/25/2016 REFERRING M.D.:  Virgil Benedict Study Performed:   Baseline Polysomnogram HISTORY:  65 year old man with a history of diabetes, hypertension, hyperlipidemia, smoking, arthritis, obesity, and recent diagnosis of myasthenia gravis, who reports snoring, EDS and witnessed apneas, carries a prior diagnosis of OSA and was on CPAP therapy. The patient endorsed the Epworth Sleepiness Scale at 11 points. The patient's weight 216 pounds with a height of 70 (inches), resulting in a BMI of 30.9 kg/m2. The patient's neck circumference measured 17 inches.  CURRENT MEDICATIONS: Lipitor, Losartan, Metformin, Mestinon   PROCEDURE:  This is a multichannel digital polysomnogram utilizing the Somnostar 11.2 system.  Electrodes and sensors were applied and monitored per AASM Specifications.   EEG, EOG, Chin and Limb EMG, were sampled at 200 Hz.  ECG, Snore and Nasal Pressure, Thermal Airflow, Respiratory Effort, CPAP Flow and Pressure, Oximetry was sampled at 50 Hz. Digital video and audio were recorded.      BASELINE STUDY  Lights Out was at 22:42 and Lights On at 05:00.  Total recording time (TRT) was 379 minutes, with a total sleep time (TST) of  302.5 minutes.   The patient's sleep latency was 47 minutes, which is prolonged.  REM latency was 61.5 minutes.  The sleep efficiency was 79.8 %.     SLEEP ARCHITECTURE: WASO (Wake after sleep onset) was 46 minutes with moderate sleep fragmentation noted.  There were 54.5 minutes in Stage N1, 138.5 minutes Stage N2, 35 minutes Stage N3 and 74.5 minutes in Stage REM.  The percentage of Stage N1 was 18.%, Stage N2 was 45.8%, Stage N3 was 11.6% and Stage R (REM sleep) was 24.6%.   Audio and video analysis did not show any abnormal or unusual movements, behaviors, phonations or vocalizations.  The patient took 1 bathroom break. Mild to moderate snoring was noted.The  EKG was in keeping with normal sinus rhythm (NSR).  RESPIRATORY ANALYSIS:  There were a total of 101 respiratory events:  32 obstructive apneas, 0 central apneas and 0 mixed apneas with a total of 32 apneas and an apnea index (AI) of 6.3 /hour. There were 69 hypopneas with a hypopnea index of 13.7 /hour. The patient also had 0 respiratory event related arousals (RERAs).      The total APNEA/HYPOPNEA INDEX (AHI) was 20./hour and the total RESPIRATORY DISTURBANCE INDEX was 20. /hour.  66 events occurred in REM sleep and 52 events in NREM. The REM AHI was 53.2 /hour, versus a non-REM AHI of 9.2. The patient spent 302.5 minutes of total sleep time in the supine position and 0 minutes in non-supine.. The supine AHI was 20.0 versus a non-supine AHI of 0.0. Sleep apnea was considerably worse in REM sleep and study was not split due to sleep fragmentation and delay in sleep onset.  OXYGEN SATURATION & C02:  The Wake baseline 02 saturation was 97%, with the lowest being 71%. Time spent below 89% saturation equaled 37 minutes.   PERIODIC LIMB MOVEMENTS:   The patient had a total of 143 Periodic Limb Movements.  The Periodic Limb Movement (PLM) index was 28.4 and the PLM Arousal index was .8/hour.  Post-study, the patient indicated that sleep was the same as usual.   IMPRESSION: 1. Obstructive Sleep Apnea (OSA) 2. Periodic Limb Movement Disorder (PLMD) 3. Dysfunctions associated with sleep stages or arousal from  sleep  RECOMMENDATIONS: 1. This overnight polysomnogram demonstrates moderate to severe obstructive sleep apnea, most pronounced in (supine) REM sleep. Treatment in the form of positive airway pressure (likely CPAP or BiPAP) is recommended and is achieved during a full night CPAP titration study for proper treatment settings and mask fitting. In addition, avoidance of the supine sleep position and weight loss are recommended. An oral appliance (aka dental device, custom made by a specialized dentist  usually), or upper airway or jaw surgery can be considered (not usually first line treatments).  2. Please note that untreated obstructive sleep apnea carries additional perioperative morbidity. Patients with significant obstructive sleep apnea should receive perioperative PAP therapy and the surgeons and particularly the anesthesiologist should be informed of the diagnosis and the severity of the sleep disordered breathing. 3. The patient should be cautioned not to drive, work at heights, or operate dangerous or heavy equipment when tired or sleepy. Review and reiteration of good sleep hygiene measures should be pursued with any patient. 4. This study shows sleep fragmentation and abnormal sleep stage percentages; these are nonspecific findings and per se do not signify an intrinsic sleep disorder or a cause for the patient's sleep-related symptoms. Causes include (but are not limited to) the first night effect of the sleep study, circadian rhythm disturbances, medication effect or an underlying mood disorder or medical problem.  5. Mild PLMs (periodic limb movements of sleep) were noted during the study without significant arousals; clinical correlation is recommended.  6. The patient will be seen in follow-up by Dr. Frances Furbish at [facility] for discussion of the test results and further management strategies. The referring provider will be notified of the test results.  I certify that I have reviewed the entire raw data recording prior to the issuance of this report in accordance with the Standards of Accreditation of the American Academy of Sleep Medicine (AASM)    Huston Foley, MD, PhD Diplomat, American Board of Psychiatry and Neurology (Neurology and Sleep Medicine)

## 2016-11-01 NOTE — Addendum Note (Signed)
Addended by: Huston Foley on: 11/01/2016 01:23 PM   Modules accepted: Orders

## 2016-11-04 ENCOUNTER — Telehealth: Payer: Self-pay

## 2016-11-04 NOTE — Telephone Encounter (Signed)
-----   Message from Huston Foley, MD sent at 11/01/2016  1:23 PM EST ----- Patient referred by Dr. Marjory Lies, seen by me on 09/17/16, diagnostic PSG on 10/25/16.    Please call and notify the patient that the recent sleep study did confirm the diagnosis of moderate to severe obstructive sleep apnea with severe desaturations in REM sleep, lowest O2 at 71%, and that I recommend treatment for this in the form of CPAP. This will require a repeat sleep study for proper titration and mask fitting. Please explain to patient and arrange for a CPAP titration study. I have placed an order in the chart. Thanks, and please route to [facility] for scheduling next sleep study.  Huston Foley, MD, PhD [facility])

## 2016-11-04 NOTE — Telephone Encounter (Signed)
I called pt to discuss sleep study results. Pt said that he did not have time to talk and asked that I call him back tomorrow.

## 2016-11-05 NOTE — Telephone Encounter (Signed)
I called pt again to discuss sleep study results. No answer, left a message asking him to call me back. 

## 2016-11-06 NOTE — Telephone Encounter (Signed)
I called pt again to discuss results. No answer, left a message asking her to call me back.

## 2016-11-07 NOTE — Telephone Encounter (Signed)
I called pt again to discuss sleep study results. This is the 4th attempt at calling pt to discuss results. If pt has not returned my call by 4pm this afternoon, I will send him a letter.

## 2016-11-07 NOTE — Telephone Encounter (Signed)
Pt returned my call. I advised him that Dr. Frances Furbish did confirm the diagnosis of moderate to severe sleep apnea with oxygen desaturations. Dr. Frances Furbish recommends treatment in the form of CPAP, which will require a repeat sleep study for proper titration and mask fitting. Pt is agreeable to a cpap titration study but asked that he be contacted after the first of the year for his sleep study because he is very busy around the holidays with work. I will inform the sleep lab. Pt verbalized understanding of results. Pt had no questions at this time but was encouraged to call back if questions arise.

## 2016-12-17 IMAGING — CT CT CHEST W/ CM
1 of 2 series · 14 of 30 positions shown, 18 images · IV contrast (iopamidol)
Comparison: None.

CLINICAL DATA: Myasthenia gravis, possible thymoma

EXAM:
CT CHEST WITH CONTRAST
TECHNIQUE: Multidetector CT imaging of the chest was performed during
intravenous contrast administration.
CONTRAST:  75mL 1TJ1P5-VTT IOPAMIDOL (1TJ1P5-VTT) INJECTION 61%

[Series 2: chest w/cm · axial · 0.71mm/px · z∈[+945,+1211]mm · 14 of 157 slices shown, 18 images]
[im 12/157  mediastinal]
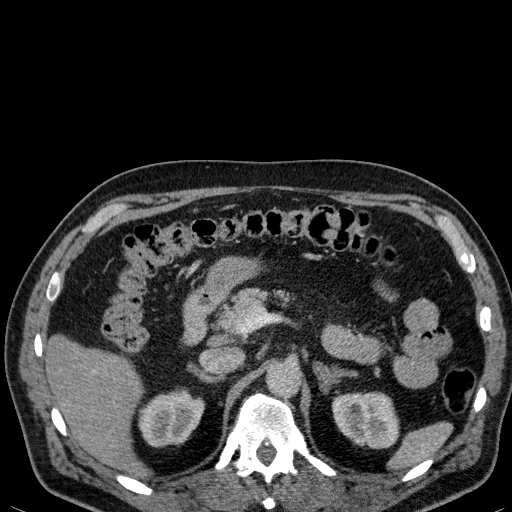
[im 12/157  lung]
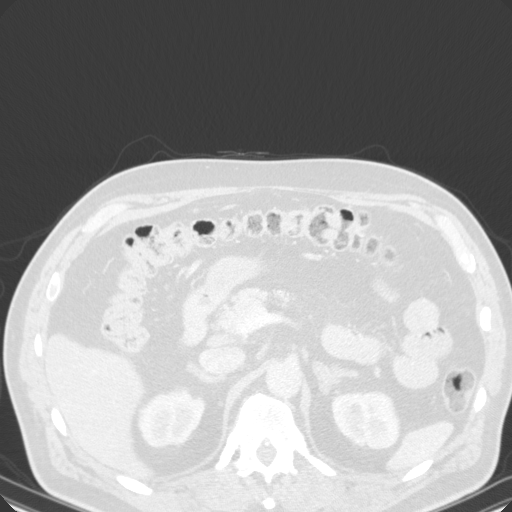
[im 23/157  lung]
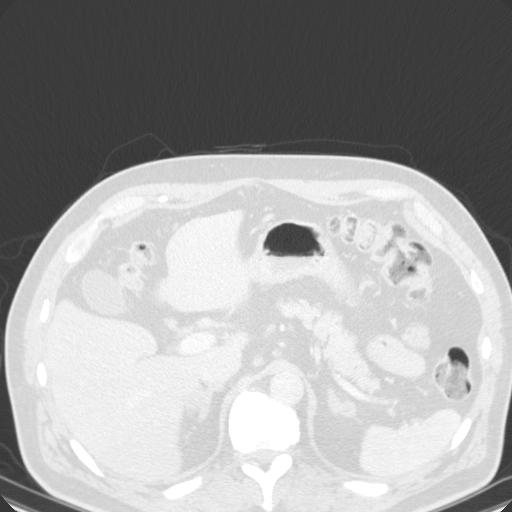
[im 34/157  lung]
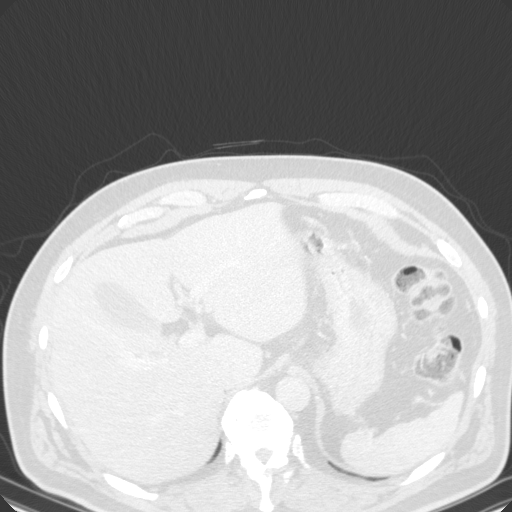
[im 45/157  lung]
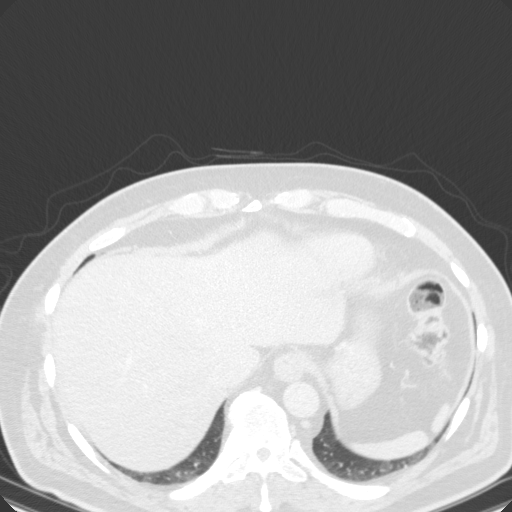
[im 56/157  mediastinal]
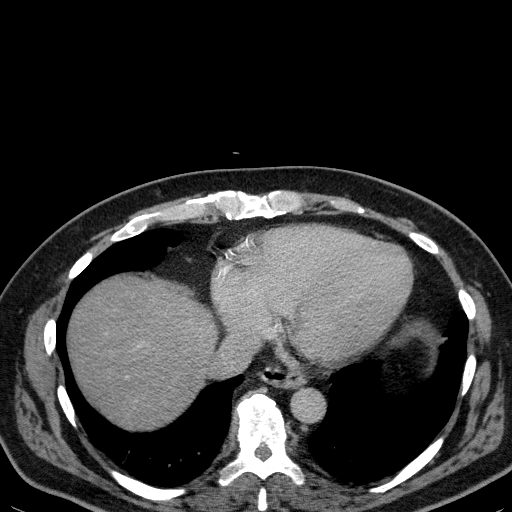
[im 56/157  lung]
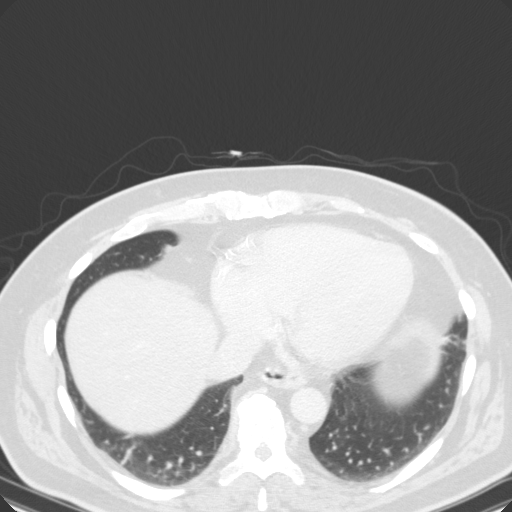
[im 67/157  lung]
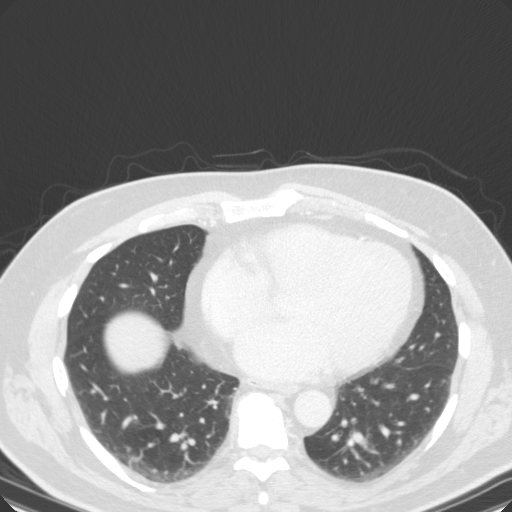
[im 74/157  lung]
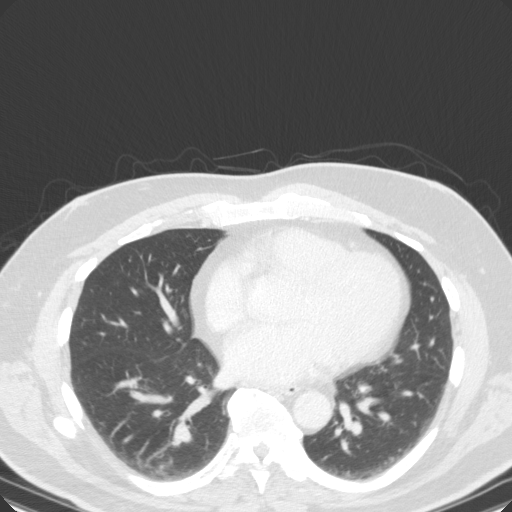
[im 79/157  lung]
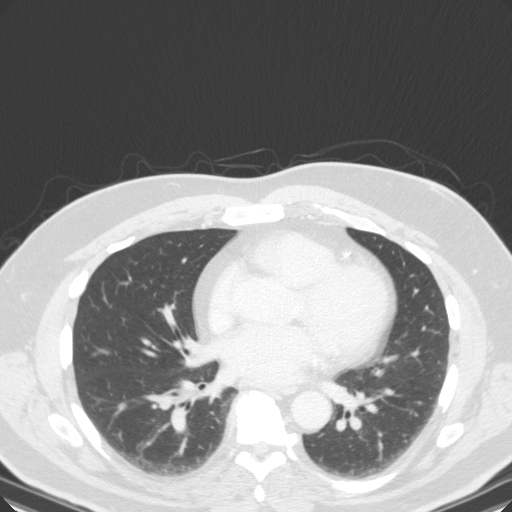
[im 90/157  mediastinal]
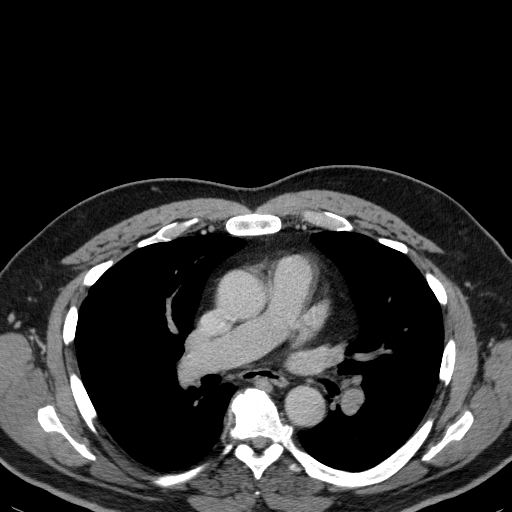
[im 90/157  lung]
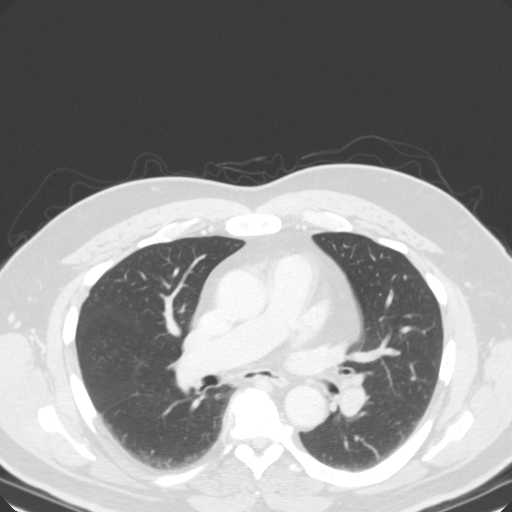
[im 101/157  lung]
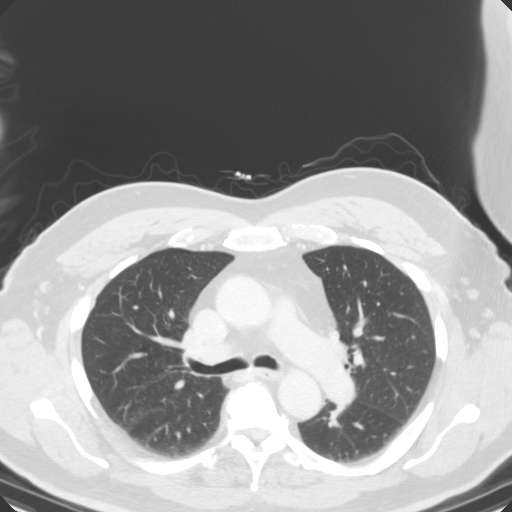
[im 112/157  lung]
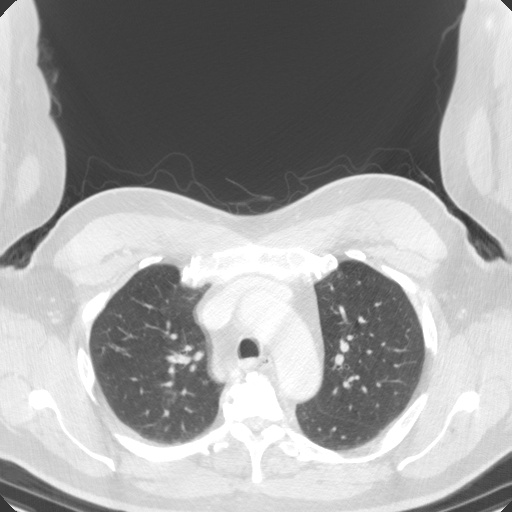
[im 123/157  lung]
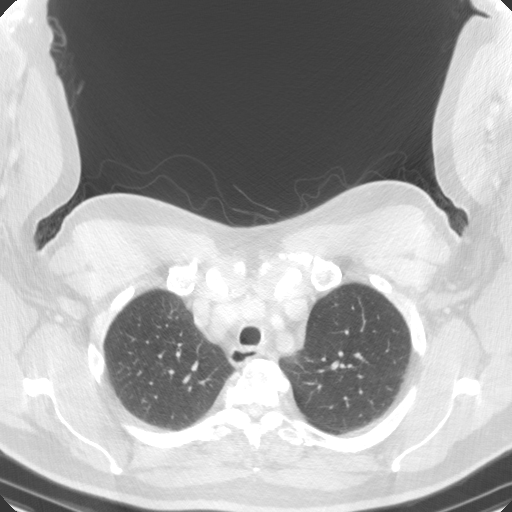
[im 134/157  mediastinal]
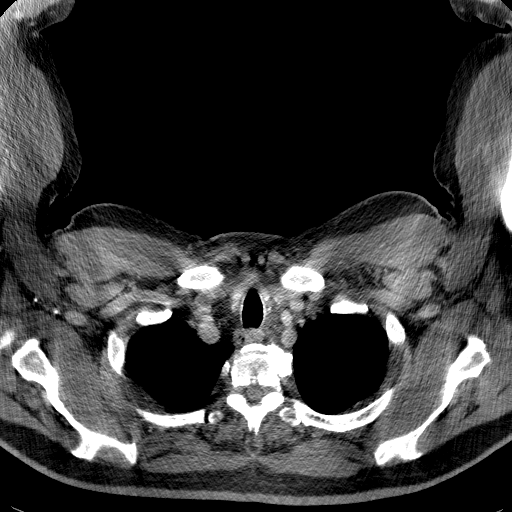
[im 134/157  lung]
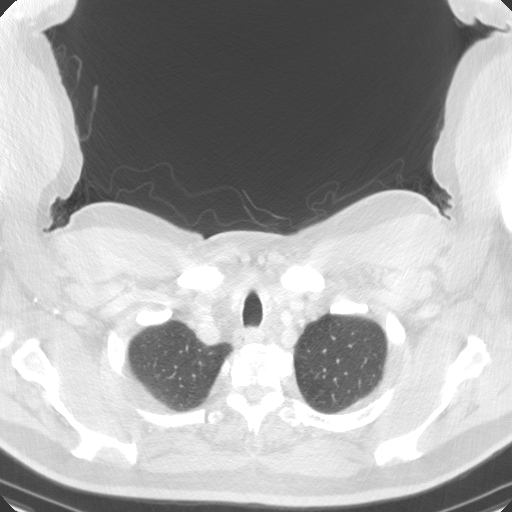
[im 145/157  lung]
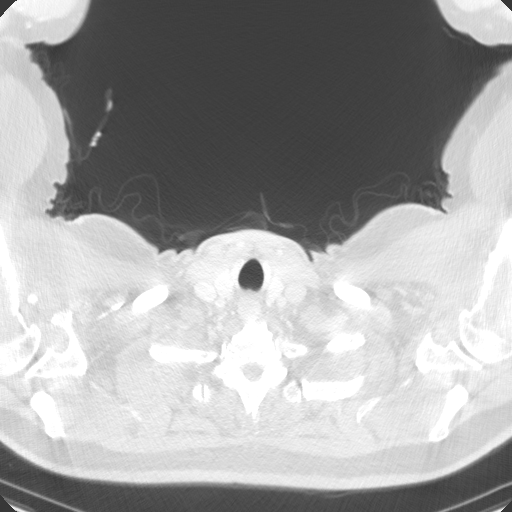

[14 of 30 positions shown; findings below may reference images not displayed]

FINDINGS: Cardiovascular: No aortic aneurysm. Atherosclerotic calcifications
of coronary arteries. Heart size within normal limits. No
pericardial effusion.

Mediastinum/Nodes: There is no evidence of mediastinal mass or
adenopathy. No mediastinal hematoma. No hilar adenopathy. Small
hiatal hernia.

Lungs/Pleura: Images of the lung parenchyma shows no acute
infiltrate or pleural effusion. No pulmonary edema. Minimal
atelectasis noted bilateral lower lobe posteriorly. No focal
consolidation. No pneumothorax. No bronchiectasis. No bronchitic
changes.

Upper Abdomen: The visualized upper abdomen shows mild fatty
infiltration of the liver. No calcified gallstones are noted within
gallbladder. Bilateral nonspecific thickening of adrenal glands
without definite evidence of a mass. Visualized upper kidneys are
unremarkable. Visualized pancreas is unremarkable. No calcified
gallstones are noted within gallbladder.

Musculoskeletal: No destructive bony lesions are noted. Sagittal
images of the spine shows degenerative changes thoracic spine.
IMPRESSION: 1. No mediastinal mass or adenopathy.
2. No infiltrate or pulmonary edema.
3. Degenerative changes thoracic spine.
4. Atherosclerotic calcifications of thoracic aorta.
5. Mild fatty infiltration of the liver.

## 2016-12-31 ENCOUNTER — Encounter: Payer: Self-pay | Admitting: Diagnostic Neuroimaging

## 2016-12-31 ENCOUNTER — Ambulatory Visit (INDEPENDENT_AMBULATORY_CARE_PROVIDER_SITE_OTHER): Payer: Managed Care, Other (non HMO) | Admitting: Diagnostic Neuroimaging

## 2016-12-31 VITALS — BP 173/79 | HR 100 | Wt 216.2 lb

## 2016-12-31 DIAGNOSIS — G7000 Myasthenia gravis without (acute) exacerbation: Secondary | ICD-10-CM | POA: Diagnosis not present

## 2016-12-31 DIAGNOSIS — R471 Dysarthria and anarthria: Secondary | ICD-10-CM

## 2016-12-31 DIAGNOSIS — G4733 Obstructive sleep apnea (adult) (pediatric): Secondary | ICD-10-CM

## 2016-12-31 DIAGNOSIS — R252 Cramp and spasm: Secondary | ICD-10-CM

## 2016-12-31 NOTE — Patient Instructions (Signed)
-   check labs  - continue pyridostigmine 60mg  twice a day  - follow up with PCP (Dr. ) to optimize high blood pressure and diabetes; then will consider to start prednisone 60mg  daily for longer term treatment of myasthenia gravis  - follow up CPAP titration study

## 2016-12-31 NOTE — Progress Notes (Signed)
[facility]  PATIENT: Jonathan Mcgee DOB: June 24, 1951  REFERRING CLINICIAN: Deliah Boston, PA-c HISTORY FROM: patient  REASON FOR VISIT: follow up     HISTORICAL  CHIEF COMPLAINT:  Chief Complaint  Patient presents with  . Myasthenia Gravis    rm 7< "concerned about my vision-double vision, blurred vision that comes and goes; concerns about driving and difficulty working on computer; have seen my opthalmologist recently"  . Follow-up    2 month    HISTORY OF PRESENT ILLNESS:   UPDATE 12/31/16: Since last visit, IVIG completed, and general energy little better, but muscle cramps worse. BP running high. He has not checked sugars lately. PSG showed mod-severe OSA, and CPAP titration study is pending. Tolerating mestinon 60mg  BID.   UPDATE 10/09/16: Since last visit, double vision is slightly better on mestinon 60mg  TID. However having more muscle cramps, excess saliva production. Fatigable weakness is persistent.   PRIOR HPI (08/28/16): 66 year old ambidextrous male with hypertension, diabetes, hypercholesterolemia here for evaluation of myasthenia gravis. For past 1-2 years patient has had mild intermittent right-sided ptosis. In May 2017 he noticed onset of double vision, blurred vision, wavy vision. This is progressively worsened. For past 1 month he has had increasing fatigue with walking. Is having difficulty with upper and lower extremity strength. He denies any speech, swallowing, breathing difficulty. His balance has been slightly off. Patient went to PCP, ophthalmology, had [facility] antibody testing which confirmed diagnosis of myasthenia gravis.   REVIEW OF SYSTEMS: Full 14 system review of systems performed and negative with exception of: only as per HPI.   ALLERGIES: Allergies  Allergen Reactions  . Lisinopril Swelling    Facial swelling    HOME MEDICATIONS: Outpatient Medications Prior to Visit  Medication Sig Dispense Refill  . atorvastatin (LIPITOR)  20 MG tablet Take 1 tablet (20 mg total) by mouth daily. 90 tablet 3  . losartan-hydrochlorothiazide (HYZAAR) 100-12.5 MG tablet Take 1 tablet by mouth daily. 90 tablet 3  . metFORMIN (GLUCOPHAGE) 1000 MG tablet Take 1 tablet (1,000 mg total) by mouth 2 (two) times daily with a meal. 180 tablet 3  . pyridostigmine (MESTINON) 60 MG tablet Take 0.5-1 tablets (30-60 mg total) by mouth 3 (three) times daily. 90 tablet 6   No facility-administered medications prior to visit.     PAST MEDICAL HISTORY: Past Medical History:  Diagnosis Date  . Arthritis   . Diabetes mellitus without complication (HCC)   . Hypercholesterolemia   . Hypertension   . Myasthenia gravis (HCC)     PAST SURGICAL HISTORY: Past Surgical History:  Procedure Laterality Date  . KNEE SURGERY Right    fractured patella > 20 yrs ago  . PENILE PROSTHESIS IMPLANT  2015    FAMILY HISTORY: Family History  Problem Relation Age of Onset  . Diabetes Mother   . Heart disease Mother   . Hyperlipidemia Mother   . Heart attack Mother   . Hyperlipidemia Father   . Kidney failure Father   . Drug abuse Maternal Grandmother   . Heart disease Maternal Grandmother   . Hypertension Maternal Grandmother   . Hyperlipidemia Maternal Grandmother   . Diabetes Maternal Grandmother     SOCIAL HISTORY:  Social History   Social History  . Marital status: Single    Spouse name: N/A  . Number of children: 1  . Years of education: 62   Occupational History  .      [employer]   Social History Main Topics  .  Smoking status: Current Some Day Smoker    Packs/day: 1.00    Types: Cigars  . Smokeless tobacco: Never Used  . Alcohol use Yes     Comment: occas  . Drug use: No  . Sexual activity: Not on file   Other Topics Concern  . Not on file   Social History Narrative   Drinks 2-3 caffeine drinks a day      PHYSICAL EXAM  GENERAL EXAM/CONSTITUTIONAL: Vitals:  Vitals:   12/31/16 1355  BP: (!) 173/79  Pulse: 100  Weight:  216 lb 3.2 oz (98.1 kg)   Body mass index is 30.58 kg/m. No exam data present  Patient is in no distress; well developed, nourished and groomed; neck is supple  CARDIOVASCULAR:  Examination of carotid arteries is normal; no carotid bruits  Regular rate and rhythm, no murmurs  Examination of peripheral vascular system by observation and palpation is normal  EYES:  Ophthalmoscopic exam of optic discs and posterior segments is normal; no papilledema or hemorrhages  MUSCULOSKELETAL:  Gait, strength, tone, movements noted in Neurologic exam below  NEUROLOGIC: MENTAL STATUS:  No flowsheet data found.  awake, alert, oriented to person, place and time  recent and remote memory intact  normal attention and concentration  language fluent, comprehension intact, naming intact,   fund of knowledge appropriate  CRANIAL NERVE:   2nd - no papilledema on fundoscopic exam  2nd, 3rd, 4th, 6th - pupils equal and reactive to light, visual fields full to confrontation, extraocular muscles --> DOUBLE VISION ON UP, LEFT AND DOWN GAZE; DECR MOVEMENT IN LEFT EYE IN ALL DIRECTIONS; MILD BILATERAL PTOSIS; no nystagmus  5th - facial sensation symmetric  7th - facial strength symmetric  8th - hearing intact  9th - palate elevates symmetrically, uvula midline  11th - shoulder shrug symmetric  12th - tongue protrusion midline  MILD DYSARTRHIA  MOTOR:   normal bulk and tone, full strength in the BUE, BLE  SENSORY:   normal and symmetric to light touch, temperature, vibration  COORDINATION:   finger-nose-finger, fine finger movements normal  REFLEXES:   deep tendon reflexes present and symmetric  GAIT/STATION:   narrow based gait; ABLE TO WALK ON TOES AND HEEL    DIAGNOSTIC DATA (LABS, IMAGING, TESTING) - I reviewed patient records, labs, notes, testing and imaging myself where available.  Lab Results  Component Value Date   WBC 8.6 10/09/2016   HGB 13.4 (A)  06/29/2016   HCT 37.0 (L) 10/09/2016   MCV 93 10/09/2016   PLT 251 10/09/2016      Component Value Date/Time   NA 142 10/09/2016 1704   K 4.6 10/09/2016 1704   CL 102 10/09/2016 1704   CO2 24 10/09/2016 1704   GLUCOSE 122 (H) 10/09/2016 1704   GLUCOSE 115 (H) 06/29/2016 1626   BUN 22 10/09/2016 1704   CREATININE 1.43 (H) 10/09/2016 1704   CREATININE 1.39 (H) 06/29/2016 1626   CALCIUM 9.5 10/09/2016 1704   PROT 7.5 10/09/2016 1704   ALBUMIN 4.6 10/09/2016 1704   AST 15 10/09/2016 1704   ALT 14 10/09/2016 1704   ALKPHOS 94 10/09/2016 1704   BILITOT 0.2 10/09/2016 1704   GFRNONAA 51 (L) 10/09/2016 1704   GFRNONAA 53 (L) 06/29/2016 1626   GFRAA 59 (L) 10/09/2016 1704   GFRAA 61 06/29/2016 1626   No results found for: CHOL, HDL, LDLCALC, LDLDIRECT, TRIG, CHOLHDL Lab Results  Component Value Date   HGBA1C 7.1 06/29/2016   No results found  for: [id number] Lab Results  Component Value Date   TSH 3.39 06/29/2016    07/04/16 CT head [I reviewed images myself and agree with interpretation. -[facility]]  - Mild microvascular disease without acute intracranial process.  09/04/16 CT chest  1. No mediastinal mass or adenopathy. 2. No infiltrate or pulmonary edema. 3. Degenerative changes thoracic spine. 4. Atherosclerotic calcifications of thoracic aorta. 5. Mild fatty infiltration of the liver.     ASSESSMENT AND PLAN  66 y.o. year old male here with new onset diagnosis of myasthenia gravis with ocular and generalized features. Having side effects from mestinon, but also some improvement in ocular symptoms. IVIG has helped generalized symptoms.    Dx: ocular + generalized myasthenia gravis  1. MG (myasthenia gravis) (HCC)   2. Myasthenia gravis (HCC)   3. Dysarthria   4. Muscle cramps   5. OSA (obstructive sleep apnea)      PLAN:  - check labs  - continue pyridostigmine 60mg  BID  - follow up with PCP to optimize HTN and DM; then will start prednisone 60mg  daily for  longer term treatment of myasthenia gravis; may consider steroid sparing therapy (imuran, cellcept or soliris)  - follow up CPAP titration study  Orders Placed This Encounter  Procedures  . CBC with Differential/Platelet  . Comprehensive metabolic panel  . Hemoglobin A1c  . Thiopurine methyltransferase(tpmt)rbc   Return in about 6 weeks (around 02/11/2017).  I reviewed images, labs, notes, records myself. I summarized findings and reviewed with patient, for this high risk condition (ocular and generalized myasthenia gravis) requiring high complexity decision making.     , MD 12/31/2016, 2:15 PM Certified in Neurology, Neurophysiology and Neuroimaging  [facility] [address], [address] [place], [address] [place] [phone]

## 2017-01-06 ENCOUNTER — Ambulatory Visit (INDEPENDENT_AMBULATORY_CARE_PROVIDER_SITE_OTHER): Payer: Managed Care, Other (non HMO) | Admitting: Neurology

## 2017-01-06 DIAGNOSIS — G4733 Obstructive sleep apnea (adult) (pediatric): Secondary | ICD-10-CM | POA: Diagnosis not present

## 2017-01-06 DIAGNOSIS — G4720 Circadian rhythm sleep disorder, unspecified type: Secondary | ICD-10-CM

## 2017-01-06 LAB — CBC WITH DIFFERENTIAL/PLATELET
BASOS ABS: 0 10*3/uL (ref 0.0–0.2)
Basos: 0 %
EOS (ABSOLUTE): 0.3 10*3/uL (ref 0.0–0.4)
Eos: 4 %
Hematocrit: 36.3 % — ABNORMAL LOW (ref 37.5–51.0)
Hemoglobin: 12.4 g/dL — ABNORMAL LOW (ref 13.0–17.7)
IMMATURE GRANS (ABS): 0 10*3/uL (ref 0.0–0.1)
IMMATURE GRANULOCYTES: 0 %
LYMPHS: 14 %
Lymphocytes Absolute: 1.1 10*3/uL (ref 0.7–3.1)
MCH: 32.7 pg (ref 26.6–33.0)
MCHC: 34.2 g/dL (ref 31.5–35.7)
MCV: 96 fL (ref 79–97)
Monocytes Absolute: 0.5 10*3/uL (ref 0.1–0.9)
Monocytes: 7 %
NEUTROS ABS: 5.6 10*3/uL (ref 1.4–7.0)
NEUTROS PCT: 75 %
PLATELETS: 231 10*3/uL (ref 150–379)
RBC: 3.79 x10E6/uL — ABNORMAL LOW (ref 4.14–5.80)
RDW: 13.3 % (ref 12.3–15.4)
WBC: 7.6 10*3/uL (ref 3.4–10.8)

## 2017-01-06 LAB — COMPREHENSIVE METABOLIC PANEL
ALK PHOS: 93 IU/L (ref 39–117)
ALT: 12 IU/L (ref 0–44)
AST: 14 IU/L (ref 0–40)
Albumin/Globulin Ratio: 1.4 (ref 1.2–2.2)
Albumin: 4.3 g/dL (ref 3.6–4.8)
BILIRUBIN TOTAL: 0.2 mg/dL (ref 0.0–1.2)
BUN/Creatinine Ratio: 13 (ref 10–24)
BUN: 20 mg/dL (ref 8–27)
CHLORIDE: 100 mmol/L (ref 96–106)
CO2: 25 mmol/L (ref 18–29)
Calcium: 9.4 mg/dL (ref 8.6–10.2)
Creatinine, Ser: 1.57 mg/dL — ABNORMAL HIGH (ref 0.76–1.27)
GFR calc non Af Amer: 45 mL/min/{1.73_m2} — ABNORMAL LOW (ref >59)
GFR, EST AFRICAN AMERICAN: 52 mL/min/{1.73_m2} — AB (ref >59)
GLUCOSE: 251 mg/dL — AB (ref 65–99)
Globulin, Total: 3.1 g/dL (ref 1.5–4.5)
Potassium: 4.2 mmol/L (ref 3.5–5.2)
Sodium: 140 mmol/L (ref 134–144)
TOTAL PROTEIN: 7.4 g/dL (ref 6.0–8.5)

## 2017-01-06 LAB — HEMOGLOBIN A1C
ESTIMATED AVERAGE GLUCOSE: 137 mg/dL
HEMOGLOBIN A1C: 6.4 % — AB (ref 4.8–5.6)

## 2017-01-06 LAB — THIOPURINE METHYLTRANSFERASE (TPMT), RBC: TPMT ACTIVITY: 31.8 U/mL{RBCs}

## 2017-01-09 ENCOUNTER — Telehealth: Payer: Self-pay | Admitting: *Deleted

## 2017-01-09 NOTE — Telephone Encounter (Signed)
Per Dr Marjory Lies, spoke with patient and informed him that his sugars are high at 251 and need optimizing per his PCP. Patient would then be a candidate for prednisone + imuran. Advised him Dr Marjory Lies will discuss with him next week. He will continue with patient's current plan per note.  Patient stated he has appointment with his PCP 01/13/17; informed him PCP can see his lab results to discuss blood glucose.  Patient verbalized understanding, appreciation for call, had no questions.

## 2017-01-13 ENCOUNTER — Telehealth: Payer: Self-pay

## 2017-01-13 ENCOUNTER — Ambulatory Visit (INDEPENDENT_AMBULATORY_CARE_PROVIDER_SITE_OTHER): Payer: 59 | Admitting: Family Medicine

## 2017-01-13 VITALS — BP 150/70 | HR 81 | Temp 98.7°F | Resp 18 | Ht 70.5 in | Wt 214.8 lb

## 2017-01-13 DIAGNOSIS — G4733 Obstructive sleep apnea (adult) (pediatric): Secondary | ICD-10-CM

## 2017-01-13 DIAGNOSIS — E785 Hyperlipidemia, unspecified: Secondary | ICD-10-CM | POA: Diagnosis not present

## 2017-01-13 DIAGNOSIS — I10 Essential (primary) hypertension: Secondary | ICD-10-CM | POA: Insufficient documentation

## 2017-01-13 DIAGNOSIS — G7000 Myasthenia gravis without (acute) exacerbation: Secondary | ICD-10-CM

## 2017-01-13 DIAGNOSIS — E1122 Type 2 diabetes mellitus with diabetic chronic kidney disease: Secondary | ICD-10-CM | POA: Diagnosis not present

## 2017-01-13 MED ORDER — METFORMIN HCL 1000 MG PO TABS: 1000.0000 mg | ORAL_TABLET | Freq: Two times a day (BID) | ORAL | 1 refills | Status: DC

## 2017-01-13 MED ORDER — LOSARTAN POTASSIUM-HCTZ 100-25 MG PO TABS: 1.0000 | ORAL_TABLET | Freq: Every day | ORAL | 1 refills | Status: DC

## 2017-01-13 MED ORDER — ATORVASTATIN CALCIUM 20 MG PO TABS: 20.0000 mg | ORAL_TABLET | Freq: Every day | ORAL | 1 refills | Status: DC

## 2017-01-13 NOTE — Telephone Encounter (Signed)
I called pt to discuss his sleep study results. No answer, left a message asking him to call me back. 

## 2017-01-13 NOTE — Addendum Note (Signed)
Addended by: Huston Foley on: 01/13/2017 08:14 AM   Modules accepted: Orders

## 2017-01-13 NOTE — Procedures (Signed)
PATIENT'S NAME:  Jonathan Mcgee, Jonathan Mcgee DOB:      Apr 07, 1951      MR#:    [id number]     DATE OF RECORDING: 01/06/2017 REFERRING M.D.:  Virgil Benedict Study Performed:   CPAP  Titration HISTORY: 66 year old right-handed gentleman with an underlying medical history of diabetes, hypertension, hyperlipidemia, smoking, arthritis, obesity, and recent diagnosis of myasthenia gravis, who presents for CPAP titration to treat his OSA. He had baseline PSG on 10/25/16, which showed total AHI of 20/h, O2 nadir of 71%. REM AHI of 53.2/h. The patient endorsed the Epworth Sleepiness Scale at 11/24 points. The patient's weight 216 pounds with a height of 70 (inches), resulting in a BMI of 30.9 kg/m2. The patient's neck circumference measured 17 inches.  CURRENT MEDICATIONS: Lipitor, Losartan, Metformin, Mestinon   PROCEDURE:  This is a multichannel digital polysomnogram utilizing the SomnoStar 11.2 system.  Electrodes and sensors were applied and monitored per AASM Specifications.   EEG, EOG, Chin and Limb EMG, were sampled at 200 Hz.  ECG, Snore and Nasal Pressure, Thermal Airflow, Respiratory Effort, CPAP Flow and Pressure, Oximetry was sampled at 50 Hz. Digital video and audio were recorded.      The patient was fitted with a medium P10 nasal pillows interface. CPAP was initiated at 5 cmH20 with heated humidity per AASM standards and pressure was advanced to 11 cmH20 because of hypopneas, apneas and desaturations and snoring. At a PAP pressure of 11 cmH20, there was a reduction of the AHI to 0 with supine sleep achieved and O2 nadir if 92%.     Lights Out was at 22:14 and Lights On at 05:15. Total recording time (TRT) was 421.5 minutes, with a total sleep time (TST) of 377.5 minutes. The patient's sleep latency was 6 minutes. REM latency was 137 minutes, which is mildly delayed.  The sleep efficiency was 89.6 %.    SLEEP ARCHITECTURE: WASO (Wake after sleep onset)  was 38 minutes with mild sleep fragmentation noted.  There were 21.5 minutes in Stage N1, 281 minutes Stage N2, 0 minutes Stage N3 and 75 minutes in Stage REM.  The percentage of Stage N1 was 5.7%, Stage N2 was 74.4%, which is increased, Stage N3 was absent, and Stage R (REM sleep) was 19.9%, which is normal.   The arousals were noted as: 53 were spontaneous, 3 were associated with PLMs, 36 were associated with respiratory events.  Audio and video analysis did not show any abnormal or unusual movements, behaviors, phonations or vocalizations.   The patient took no bathroom breaks. []  EKG was in keeping with normal sinus rhythm (NSR).  RESPIRATORY ANALYSIS:  There was a total of 36 respiratory events: 0 obstructive apneas, 0 central apneas and 0 mixed apneas with a total of 0 apneas and an apnea index (AI) of 0 /hour. There were 36 hypopneas with a hypopnea index of 5.7/hour. The patient also had 0 respiratory event related arousals (RERAs).      The total APNEA/HYPOPNEA INDEX  (AHI) was 5.7 /hour and the total RESPIRATORY DISTURBANCE INDEX was 5.7 .hour  22 events occurred in REM sleep and 14 events in NREM. The REM AHI was 17.6 /hour versus a non-REM AHI of 2.8 /hour.  The patient spent 377.5 minutes of total sleep time in the supine position and 0 minutes in non-supine. The supine AHI was 5.7, versus a non-supine AHI of 0.0.  OXYGEN SATURATION & C02:  The baseline 02 saturation was 92%, with the lowest being 73%. Time spent  below 89% saturation equaled 23 minutes.  PERIODIC LIMB MOVEMENTS:    The patient had a total of 11 Periodic Limb Movements. The Periodic Limb Movement (PLM) index was 1.7 and the PLM Arousal index was .5/hour.    Post-study, the patient indicated that sleep was slightly better than usual.   DIAGNOSIS 1. Obstructive Sleep Apnea  2. Dysfunctions associated with sleep stages or arousal from sleep  PLANS/RECOMMENDATIONS:  1. This study demonstrates resolution of the patient's obstructive sleep apnea with CPAP therapy. I will,  therefore, start the patient on home CPAP treatment at a pressure of 11 cm via medium nasal pillows with heated humidity. The patient should be reminded to be fully compliant with PAP therapy to improve sleep related symptoms and decrease long term cardiovascular risks. The patient should be reminded, that it may take up to 3 months to get fully used to using PAP with all planned sleep. The earlier full compliance is achieved, the better long term compliance tends to be. Please note that untreated obstructive sleep apnea carries additional perioperative morbidity. Patients with significant obstructive sleep apnea should receive perioperative PAP therapy and the surgeons and particularly the anesthesiologist should be informed of the diagnosis and the severity of the sleep disordered breathing. 2. The patient should be cautioned not to drive, work at heights, or operate dangerous or heavy equipment when tired or sleepy. Review and reiteration of good sleep hygiene measures should be pursued with any patient. 3. This study shows sleep fragmentation and abnormal sleep stage percentages; these are nonspecific findings and per se do not signify an intrinsic sleep disorder or a cause for the patient's sleep-related symptoms. Causes include (but are not limited to) the first night effect of the sleep study, circadian rhythm disturbances, medication effect or an underlying mood disorder or medical problem.   I certify that I have reviewed the entire raw data recording prior to the issuance of this report in accordance with the Standards of Accreditation of the American Academy of Sleep Medicine (AASM)     Huston Foley, MD, PhD Diplomat, American Board of Psychiatry and Neurology (Neurology and Sleep Medicine)

## 2017-01-13 NOTE — Patient Instructions (Addendum)
  Keep a record of your blood sugars at various times of the day including fasting, and 2 hours after meals. Check once per day and keep a record of those for a follow-up visit in 2-3 weeks to determine if medication changes are needed. I will recheck your kidney function today as well as refer you to a nephrologist, but depending on the kidney function we may need to adjust your medications at next visit.  Your blood pressure medication was changed to a higher dose of HCTZ. I suspect the starting sleep apnea treatment will also help your blood pressures.  It does appear you are due for pneumonia vaccine with both Prevnar then Pneumovax, as well as your other two hepatitis B vaccines. You can discuss those with your next neurologist to make sure it is okay for [place] to give you those medications now or if there is any change in timing with plan for other medication treatment.     IF you received an x-ray today, you will receive an invoice from [facility]. Please contact [facility] at [phone] with questions or concerns regarding your invoice.   IF you received labwork today, you will receive an invoice from [facility]. Please contact [facility] at [phone] with questions or concerns regarding your invoice.   Our billing staff will not be able to assist you with questions regarding bills from these companies.  You will be contacted with the lab results as soon as they are available. The fastest way to get your results is to activate your My Chart account. Instructions are located on the last page of this paperwork. If you have not heard from [place] regarding the results in 2 weeks, please contact this office.

## 2017-01-13 NOTE — Telephone Encounter (Signed)
-----   Message from Huston Foley, MD sent at 01/13/2017  8:14 AM EST ----- Patient referred by Dr. Marjory Lies, seen by me on 09/17/16, diagnostic PSG on 10/25/16, CPAP study on 01/06/17.  Please call and inform patient that I have entered an order for treatment with positive airway pressure (PAP) treatment of obstructive sleep apnea (OSA). He did well during the latest sleep study with CPAP.  We will, therefore, arrange for a machine for home use through a DME (durable medical equipment) company of His choice; and I will see the patient back in follow-up in about 8-10 weeks. Please also explain to the patient that I will be looking out for compliance data, which can be downloaded from the machine (stored on an SD card, that is inserted in the machine) or via remote access through a modem, that is built into the machine. At the time of the followup appointment we will discuss sleep study results and how it is going with PAP treatment at home. Please advise patient to bring His machine at the time of the first FU visit, even though this is cumbersome. Bringing the machine for every visit after that will likely not be needed, but often helps for the first visit to troubleshoot if needed. Please re-enforce the importance of compliance with treatment and the need for [place] to monitor compliance data - often an insurance requirement and actually good feedback for the patient as far as how they are doing.  Also remind patient, that any interim PAP machine or mask issues should be first addressed with the DME company, as they can often help better with technical and mask fit issues. Please ask if patient has a preference regarding DME company.  Please also make sure, the patient has a follow-up appointment with me in about 8-10 weeks from the setup date, thanks.  Once you have spoken to the patient - and faxed/routed report to PCP and referring MD (if other than PCP), you can close this encounter, thanks,   Huston Foley, MD,  PhD [facility] ([facility]

## 2017-01-13 NOTE — Progress Notes (Signed)
Patient referred by Dr. Marjory Lies, seen by me on 09/17/16, diagnostic PSG on 10/25/16, CPAP study on 01/06/17.  Please call and inform patient that I have entered an order for treatment with positive airway pressure (PAP) treatment of obstructive sleep apnea (OSA). He did well during the latest sleep study with CPAP.  We will, therefore, arrange for a machine for home use through a DME (durable medical equipment) company of His choice; and I will see the patient back in follow-up in about 8-10 weeks. Please also explain to the patient that I will be looking out for compliance data, which can be downloaded from the machine (stored on an SD card, that is inserted in the machine) or via remote access through a modem, that is built into the machine. At the time of the followup appointment we will discuss sleep study results and how it is going with PAP treatment at home. Please advise patient to bring His machine at the time of the first FU visit, even though this is cumbersome. Bringing the machine for every visit after that will likely not be needed, but often helps for the first visit to troubleshoot if needed. Please re-enforce the importance of compliance with treatment and the need for [place] to monitor compliance data - often an insurance requirement and actually good feedback for the patient as far as how they are doing.  Also remind patient, that any interim PAP machine or mask issues should be first addressed with the DME company, as they can often help better with technical and mask fit issues. Please ask if patient has a preference regarding DME company.  Please also make sure, the patient has a follow-up appointment with me in about 8-10 weeks from the setup date, thanks.  Once you have spoken to the patient - and faxed/routed report to PCP and referring MD (if other than PCP), you can close this encounter, thanks,   Huston Foley, MD, PhD [facility] ([facility]

## 2017-01-13 NOTE — Progress Notes (Signed)
By signing my name below, I, [name], attest that this documentation has been prepared under the direction and in the presence of [name], MD.  Electronically Signed: Arvilla Market, Medical Scribe. 01/13/17. 4:08 PM.  Subjective:    Patient ID: Jonathan Mcgee, male    DOB: 23-Sep-1951, 66 y.o.   MRN: [id number]  HPI Chief Complaint  Patient presents with  . Follow-up    will need lab work  . Hypertension  . Diabetes    HPI Comments: Jonathan Mcgee is a 66 y.o. male with a PMHx of HTN, HLD, DM, and myasthenia gravis who presents to the [facility] for follow-up.  HTN: Takes losartan-HCTZ 100/12.5 mg QD. Pt is compliant with losartan-HCTZ. Denies experiencing any negative side effects from losartan-HCTZ. Lab Results  Component Value Date   CREATININE 1.57 (H) 12/31/2016   BP Readings from Last 3 Encounters:  01/13/17 (!) 150/70  12/31/16 (!) 173/79  10/09/16 (!) 146/67   HLD: Takes lipitor 20 mg QD. Denies experiencing any negative side effects from lipitor. No results found for: CHOL, HDL, LDLCALC, LDLDIRECT, TRIG, CHOLHDL Lab Results  Component Value Date   ALT 12 12/31/2016   AST 14 12/31/2016   ALKPHOS 93 12/31/2016   BILITOT 0.2 12/31/2016   DM: Takes metformin 1000 mg BID. See lab notes from neurology. A1c was okay but blood sugar 251 at that time. Needed optimization of DM for potential prenisone and imuran. Pt is compliant with metformin BID. Pt reports occasional diarrhea but no other acute negative side effects from metformin. Pt doesn't check his blood sugar although he has a glucometer at home. Pt reports work stressors caused his bp and blood sugar to rise. The week before he saw Dr. Marjory Lies, he had a 70 hour work week. 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 Lab Results  Component Value Date   HGBA1C 6.4 (H) 12/31/2016   No results found for: MICROALBUR, [id number]  Chronic Kidney Disease, Stage 3: Range of 1.39 to 1.57  over past 6 months. Pt does not take NSAIDs, advil, BC. FHx: Dad passed from stage 5 chronic kidney failure after back surgery. Lab Results  Component Value Date   CREATININE 1.57 (H) 12/31/2016   OSA: Dx 10/25/16, moderate to severe. Evaluation by Dr. Frances Furbish. Plan for CPAP titration this year Pt had his CPAP titration study done last week.  Myasthenia Gravis: Takes mestinon 60 mg BID. He has completed IVIG. Planned for recheck in 6 months, but as above HTN and DM optimization for prednisone and imuran. Pt is compliant with his medication. He reports experiencing blurred vision and double vision that makes it difficult to drive as well as occasional muscle spasms in his calf. Pt saw his ophthalmologist for his vision change as well as Dr. Marjory Lies. Denies experiencing any negative side effects from his medication.  Health Maintenance: Pt would like to continue his Hep B titers.  Patient Active Problem List   Diagnosis Date Noted  . Essential hypertension 01/13/2017  . Hyperlipidemia 01/13/2017  . Myasthenia gravis (HCC) 08/28/2016   Past Medical History:  Diagnosis Date  . Arthritis   . Diabetes mellitus without complication (HCC)   . Hypercholesterolemia   . Hypertension   . Myasthenia gravis [facility])    Past Surgical History:  Procedure Laterality Date  . KNEE SURGERY Right    fractured patella > 20 yrs ago  . PENILE PROSTHESIS IMPLANT  2015   Allergies  Allergen Reactions  .  Lisinopril Swelling    Facial swelling   Prior to Admission medications   Medication Sig Start Date End Date Taking? Authorizing Provider  atorvastatin (LIPITOR) 20 MG tablet Take 1 tablet (20 mg total) by mouth daily. 06/29/16   Collene Gobble, MD  losartan-hydrochlorothiazide (HYZAAR) 100-12.5 MG tablet Take 1 tablet by mouth daily. 06/29/16   Collene Gobble, MD  metFORMIN (GLUCOPHAGE) 1000 MG tablet Take 1 tablet (1,000 mg total) by mouth 2 (two) times daily with a meal. 06/29/16   Collene Gobble, MD    pyridostigmine (MESTINON) 60 MG tablet Take 0.5-1 tablets (30-60 mg total) by mouth 3 (three) times daily. 08/28/16   Suanne Marker, MD   Social History   Social History  . Marital status: Single    Spouse name: N/A  . Number of children: 1  . Years of education: 54   Occupational History  .      [employer]   Social History Main Topics  . Smoking status: Current Some Day Smoker    Packs/day: 1.00    Types: Cigars  . Smokeless tobacco: Never Used  . Alcohol use Yes     Comment: occas  . Drug use: No  . Sexual activity: Not on file   Other Topics Concern  . Not on file   Social History Narrative   Drinks 2-3 caffeine drinks a day    Review of Systems  Constitutional: Negative for fatigue and unexpected weight change.  Eyes: Positive for visual disturbance.  Respiratory: Negative for cough, chest tightness, shortness of breath and wheezing.   Cardiovascular: Negative for chest pain, palpitations and leg swelling.  Gastrointestinal: Positive for diarrhea. Negative for abdominal pain.  Musculoskeletal: Positive for myalgias.  Neurological: Negative for dizziness, light-headedness and headaches.   Objective:  Physical Exam  Constitutional: He appears well-developed and well-nourished. No distress.  HENT:  Head: Normocephalic and atraumatic.  Eyes: Conjunctivae and EOM are normal. Pupils are equal, round, and reactive to light. Right eye exhibits no nystagmus. Left eye exhibits no nystagmus.  Neck: Neck supple.  Cardiovascular: Normal rate and regular rhythm.  Exam reveals no friction rub.   No murmur heard. Pulmonary/Chest: Effort normal and breath sounds normal. No respiratory distress. He has no wheezes. He has no rales.  Neurological: He is alert.  Skin: Skin is warm and dry.  Psychiatric: He has a normal mood and affect. His behavior is normal.  Nursing note and vitals reviewed.   Vitals:   01/13/17 1529  BP: (!) 150/70  Pulse: 81  Resp: 18  Temp: 98.7 F (37.1  C)  TempSrc: Oral  SpO2: 97%  Weight: 214 lb 12.8 oz (97.4 kg)  Height: 5' 10.5" (1.791 m)   Body mass index is 30.38 kg/m.  Assessment & Plan:  Multiple previous office notes were reviewed as part of this visit, over 40 mins of face-to-face care as well as review of plan.  Heston Widener is a 66 y.o. 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 disease, without long-term current use of insulin, unspecified CKD stage (HCC) - Plan: Microalbumin, urine, Basic metabolic panel, Ambulatory referral to Nephrology, metFORMIN (GLUCOPHAGE) 1000 MG tablet  - Most recent A1c overall looked okay, few elevated readings as noted at neurology. May be recent elevation that has not yet had time to reflect on A1c. Discussed with recent A1c, repeating now would not be helpful. Recommended home glucose monitoring at various times of the day, and to return with that list  seen in the next 2 weeks to determine if other medication needed in the interim. Continue metformin the same dose for now.  - Chronic kidney disease, likely due to diabetes, hypertension, but will refer to nephrology. Check BMP to monitor GFR, as metformin may need to be discontinued if GFR is less than 35 or 45. Avoid NSAIDs, and maintain hydration.  Essential hypertension - Plan: Basic metabolic panel, losartan-hydrochlorothiazide (HYZAAR) 100-25 MG tablet  -Incomplete control, especially with chronic kidney disease and diabetes. Goal blood pressure under 130/80. Increase HCTZ component of combo to 25 mg daily. Orthostatic/hypotension precautions discussed  Hyperlipidemia, unspecified hyperlipidemia type - Plan: Lipid panel, atorvastatin (LIPITOR) 20 MG tablet  -Check lipid panel as no recent readings noted, but currently tolerating Lipitor 20 mg. Recent LFTs overall okay.  Myasthenia gravis (HCC)  - Followed by neurology. Planning to try prednisone and Imuran, but improved diabetic and hypertension control needed initially. Changes as  above.  OSA (obstructive sleep apnea)  -Has not yet had CPAP titration. Discussed effect of OSA on hypertension, and suspect his numbers will improve once he is on CPAP.  Health maintenance Appears to be due for Prevnar, then Pneumovax. Also due for second then third hepatitis B vaccines. He will clear these vaccines with his neurologist and any current medications planned, but suspect he can have this performed at upcoming visit.  Meds ordered this encounter  Medications  . losartan-hydrochlorothiazide (HYZAAR) 100-25 MG tablet    Sig: Take 1 tablet by mouth daily.    Dispense:  90 tablet    Refill:  1  . metFORMIN (GLUCOPHAGE) 1000 MG tablet    Sig: Take 1 tablet (1,000 mg total) by mouth 2 (two) times daily with a meal.    Dispense:  180 tablet    Refill:  1  . atorvastatin (LIPITOR) 20 MG tablet    Sig: Take 1 tablet (20 mg total) by mouth daily.    Dispense:  90 tablet    Refill:  1   Patient Instructions    Keep a record of your blood sugars at various times of the day including fasting, and 2 hours after meals. Check once per day and keep a record of those for a follow-up visit in 2-3 weeks to determine if medication changes are needed. I will recheck your kidney function today as well as refer you to a nephrologist, but depending on the kidney function we may need to adjust your medications at next visit.  Your blood pressure medication was changed to a higher dose of HCTZ. I suspect the starting sleep apnea treatment will also help your blood pressures.  It does appear you are due for pneumonia vaccine with both Prevnar then Pneumovax, as well as your other two hepatitis B vaccines. You can discuss those with your next neurologist to make sure it is okay for [place] to give you those medications now or if there is any change in timing with plan for other medication treatment.     IF you received an x-ray today, you will receive an invoice from [facility]. Please  contact [facility] at [phone] with questions or concerns regarding your invoice.   IF you received labwork today, you will receive an invoice from [place]. Please contact [facility] at [phone] with questions or concerns regarding your invoice.   Our billing staff will not be able to assist you with questions regarding bills from these companies.  You will be contacted with the lab results as soon as  they are available. The fastest way to get your results is to activate your My Chart account. Instructions are located on the last page of this paperwork. If you have not heard from [place] regarding the results in 2 weeks, please contact this office.       I personally performed the services described in this documentation, which was scribed in my presence. The recorded information has been reviewed and considered for accuracy and completeness, addended by me as needed, and agree with information above.  Signed,   [name], MD Primary Care at [facility].  01/15/17 10:47 PM

## 2017-01-14 LAB — BASIC METABOLIC PANEL
BUN/Creatinine Ratio: 12 (ref 10–24)
BUN: 18 mg/dL (ref 8–27)
CALCIUM: 9.1 mg/dL (ref 8.6–10.2)
CHLORIDE: 105 mmol/L (ref 96–106)
CO2: 21 mmol/L (ref 18–29)
Creatinine, Ser: 1.46 mg/dL — ABNORMAL HIGH (ref 0.76–1.27)
GFR calc Af Amer: 57 — ABNORMAL LOW (ref >59)
GFR calc non Af Amer: 49 — ABNORMAL LOW (ref >59)
GLUCOSE: 148 mg/dL — AB (ref 65–99)
POTASSIUM: 4.5 mmol/L (ref 3.5–5.2)
SODIUM: 144 mmol/L (ref 134–144)

## 2017-01-14 LAB — LIPID PANEL
Chol/HDL Ratio: 3.9 (ref 0.0–5.0)
Cholesterol, Total: 133 mg/dL (ref 100–199)
HDL: 34 mg/dL — AB (ref >39)
LDL Calculated: 60 (ref 0–99)
TRIGLYCERIDES: 197 mg/dL — AB (ref 0–149)
VLDL Cholesterol Cal: 39 (ref 5–40)

## 2017-01-14 LAB — MICROALBUMIN, URINE: MICROALBUM., U, RANDOM: 13.8 ug/mL

## 2017-01-14 NOTE — Telephone Encounter (Signed)
Pt returned my call. I advised him of his sleep study results. Pt is agreeable to starting a cpap. I advised him that I would send the order to [facility], a DME, and they will contact him for cpap set up. I reviewed cpap compliance expectations with the pt. A follow up appt was made for 04/03/2017 at 3:00pm with Dr. Frances Furbish. Pt verbalized understanding of results. Pt had no questions at this time but was encouraged to call back if questions arise.

## 2017-01-24 ENCOUNTER — Encounter: Payer: Self-pay | Admitting: Diagnostic Neuroimaging

## 2017-01-24 ENCOUNTER — Other Ambulatory Visit: Payer: Self-pay | Admitting: *Deleted

## 2017-01-24 MED ORDER — PYRIDOSTIGMINE BROMIDE 60 MG PO TABS: 30.0000 mg | ORAL_TABLET | Freq: Three times a day (TID) | ORAL | 6 refills | Status: DC

## 2017-01-30 ENCOUNTER — Encounter: Payer: Self-pay | Admitting: Family Medicine

## 2017-01-30 ENCOUNTER — Ambulatory Visit (INDEPENDENT_AMBULATORY_CARE_PROVIDER_SITE_OTHER): Payer: 59 | Admitting: Family Medicine

## 2017-01-30 VITALS — BP 158/67 | HR 88 | Temp 99.6°F | Resp 18 | Ht 70.5 in | Wt 209.0 lb

## 2017-01-30 DIAGNOSIS — E1121 Type 2 diabetes mellitus with diabetic nephropathy: Secondary | ICD-10-CM | POA: Diagnosis not present

## 2017-01-30 DIAGNOSIS — I10 Essential (primary) hypertension: Secondary | ICD-10-CM

## 2017-01-30 MED ORDER — AMLODIPINE BESYLATE 5 MG PO TABS: 5.0000 mg | ORAL_TABLET | Freq: Every day | ORAL | 2 refills | Status: DC

## 2017-01-30 NOTE — Progress Notes (Signed)
By signing my name below, I, Jonathan Mcgee, attest that this documentation has been prepared under the direction and in the presence of Jonathan Staggers, MD.  Electronically Signed: Arvilla Mcgee, Medical Scribe. 01/30/17. 3:53 PM.  Subjective:    Patient ID: Jonathan Mcgee, male    DOB: 03-22-1951, 66 y.o.   MRN: [id number]  HPI Chief Complaint  Patient presents with  . Follow-up    daibetes/blood sugar reading    HPI Comments: Jonathan Mcgee is a 66 y.o. male who presents to the [facility] for DM follow-up. He was seen just a few weeks ago.  DM: Taking metformin 1000 mg BID. Planned for maximizing DM control so he can start prednisone or imuran for his myasthenia gravis. His blood sugar has been ranging 102 - 295, most recently he was in the low to mid 100s. Reports his hypoglycemic episodes don't have any food triggers. He notes he experiences "the shakes" when his blood sugar is under 100, but Denies experiencing recent hypoglycemic episodes.  Lab Results  Component Value Date   HGBA1C 6.4 (H) 12/31/2016   HTN: Decreased control at last visit. We increased his losartan- HCTZ to 25 mg QD. Pt is compliant with his increased dose of losartan-HCTZ for a week. and He reports his systolic bp still runs high at 150s.Denies chest pain, chest tightness, SOB, light-headedness, dizziness, blurry vision, trouble urinating, or other acute sxs. BP Readings from Last 3 Encounters:  01/30/17 (!) 158/67  01/13/17 (!) 150/70  12/31/16 (!) 173/79   CKD: That has been monitored to see if metformin changes were needed. Pt has an appt with a nephrologist 02/12/17. Lab Results  Component Value Date   CREATININE 1.46 (H) 01/13/2017   Myasthenia Gravis: Pt is out of mestinon and hasn't taken it in the past 3 days. He's waiting for his home delivery to arrive at his house. Is followed by Dr. Marjory Lies.  OSA: Pt plans on picking up his CPAP next Tuesday, 02/04/17.  Patient Active  Problem List   Diagnosis Date Noted  . Essential hypertension 01/13/2017  . Hyperlipidemia 01/13/2017  . Myasthenia gravis (HCC) 08/28/2016   Past Medical History:  Diagnosis Date  . Arthritis   . Diabetes mellitus without complication (HCC)   . Hypercholesterolemia   . Hypertension   . Myasthenia gravis [facility])    Past Surgical History:  Procedure Laterality Date  . KNEE SURGERY Right    fractured patella > 20 yrs ago  . PENILE PROSTHESIS IMPLANT  2015   Allergies  Allergen Reactions  . Lisinopril Swelling    Facial swelling   Prior to Admission medications   Medication Sig Start Date End Date Taking? Authorizing Provider  atorvastatin (LIPITOR) 20 MG tablet Take 1 tablet (20 mg total) by mouth daily. 01/13/17  Yes Shade Flood, MD  losartan-hydrochlorothiazide (HYZAAR) 100-25 MG tablet Take 1 tablet by mouth daily. 01/13/17  Yes Shade Flood, MD  metFORMIN (GLUCOPHAGE) 1000 MG tablet Take 1 tablet (1,000 mg total) by mouth 2 (two) times daily with a meal. 01/13/17  Yes Shade Flood, MD  pyridostigmine (MESTINON) 60 MG tablet Take 0.5-1 tablets (30-60 mg total) by mouth 3 (three) times daily. 01/24/17  Yes Suanne Marker, MD   Social History   Social History  . Marital status: Single    Spouse name: N/A  . Number of children: 1  . Years of education: 21   Occupational History  .  [employer]   Social History Main Topics  . Smoking status: Current Some Day Smoker    Packs/day: 1.00    Types: Cigars  . Smokeless tobacco: Never Used  . Alcohol use Yes     Comment: occas  . Drug use: No  . Sexual activity: Not on file   Other Topics Concern  . Not on file   Social History Narrative   Drinks 2-3 caffeine drinks a day    Review of Systems  Constitutional: Negative for fatigue and unexpected weight change.  Eyes: Negative for visual disturbance.  Respiratory: Negative for cough, chest tightness and shortness of breath.   Cardiovascular: Negative for chest  pain.  Genitourinary: Negative for difficulty urinating.  Neurological: Negative for dizziness, light-headedness and headaches.   Objective:  Physical Exam  Constitutional: He appears well-developed and well-nourished. No distress.  HENT:  Head: Normocephalic and atraumatic.  Eyes: Conjunctivae are normal.  Neck: Neck supple.  Cardiovascular: Normal rate, regular rhythm and normal heart sounds.  Exam reveals no gallop and no friction rub.   No murmur heard. Pulmonary/Chest: Effort normal and breath sounds normal. No respiratory distress. He has no wheezes. He has no rales.  Musculoskeletal: He exhibits no edema.  Neurological: He is alert.  Skin: Skin is warm and dry.  Psychiatric: He has a normal mood and affect. His behavior is normal.  Nursing note and vitals reviewed.   Vitals:   01/30/17 1516  BP: (!) 158/67  Pulse: 88  Resp: 18  Temp: 99.6 F (37.6 C)  TempSrc: Oral  SpO2: 97%  Weight: 209 lb (94.8 kg)  Height: 5' 10.5" (1.791 m)  Body mass index is 29.56 kg/m. Assessment & Plan:   Jonathan Mcgee is a 66 y.o. male Essential hypertension - Plan: amLODipine (NORVASC) 5 MG tablet  -Still insufficient control, continue losartan HCTZ, add amlodipine 5 mg daily, monitor him readings and orthostatic precautions were discussed. Recheck with lab work in 2 months.  Type 2 diabetes mellitus with diabetic nephropathy, without long-term current use of insulin (HCC)  - Elevated reading in neurology office, but based on hemoglobin A1c of 6.4, and home readings in the low 100s, I'm hesitant to increase his medication further at this point. If he does go on prednisone, likely will need additional medication at that time. Last creatinine okay to remain on same dose of metformin, but plan to recheck labs in 2 months with A1c, BMP to assess creatinine clearance and safety of metformin usage. Meds ordered this encounter  Medications  . amLODipine (NORVASC) 5 MG tablet    Sig: Take 1  tablet (5 mg total) by mouth daily.    Dispense:  30 tablet    Refill:  2   Patient Instructions    Based on your last A1c, overall diabetes has been at decent control.  See information below on diet, but follow up in 2 months to repeat A1c.  If you continue to have readings in the 200's - return sooner and we can adjust medicines sooner if needed. If Dr. Marjory Lies needs different control than your recent readings, I am happy to discuss some other options if needed. Once you go on prednisone, can adjust medications further if needed, especially if blood sugars running higher.   Add amlodipine once per day, continue losartan HCT at same dose. If lightheaded or dizzy on new med, stop it and return to discuss options.   Return to the clinic or go to the nearest emergency room if  any of your symptoms worsen or new symptoms occur.    IF you received an x-ray today, you will receive an invoice from [facility]. Please contact [facility] at [phone] with questions or concerns regarding your invoice.   IF you received labwork today, you will receive an invoice from [place]. Please contact [facility] at [phone] with questions or concerns regarding your invoice.   Our billing staff will not be able to assist you with questions regarding bills from these companies.  You will be contacted with the lab results as soon as they are available. The fastest way to get your results is to activate your My Chart account. Instructions are located on the last page of this paperwork. If you have not heard from [place] regarding the results in 2 weeks, please contact this office.      I personally performed the services described in this documentation, which was scribed in my presence. The recorded information has been reviewed and considered for accuracy and completeness, addended by me as needed, and agree with information above.  Signed,   Jonathan Staggers, MD Primary Care at  [facility].  02/01/17 5:34 PM

## 2017-01-30 NOTE — Patient Instructions (Addendum)
  Based on your last A1c, overall diabetes has been at decent control.  See information below on diet, but follow up in 2 months to repeat A1c.  If you continue to have readings in the 200's - return sooner and we can adjust medicines sooner if needed. If Dr. Marjory Lies needs different control than your recent readings, I am happy to discuss some other options if needed. Once you go on prednisone, can adjust medications further if needed, especially if blood sugars running higher.   Add amlodipine once per day, continue losartan HCT at same dose. If lightheaded or dizzy on new med, stop it and return to discuss options.   Return to the clinic or go to the nearest emergency room if any of your symptoms worsen or new symptoms occur.    IF you received an x-ray today, you will receive an invoice from [facility]. Please contact [facility] at [phone] with questions or concerns regarding your invoice.   IF you received labwork today, you will receive an invoice from [place]. Please contact [facility] at [phone] with questions or concerns regarding your invoice.   Our billing staff will not be able to assist you with questions regarding bills from these companies.  You will be contacted with the lab results as soon as they are available. The fastest way to get your results is to activate your My Chart account. Instructions are located on the last page of this paperwork. If you have not heard from [place] regarding the results in 2 weeks, please contact this office.

## 2017-02-04 ENCOUNTER — Other Ambulatory Visit: Payer: Self-pay | Admitting: *Deleted

## 2017-02-04 DIAGNOSIS — I10 Essential (primary) hypertension: Secondary | ICD-10-CM

## 2017-02-04 MED ORDER — AMLODIPINE BESYLATE 5 MG PO TABS: 5.0000 mg | ORAL_TABLET | Freq: Every day | ORAL | 0 refills | Status: DC

## 2017-02-12 ENCOUNTER — Ambulatory Visit (INDEPENDENT_AMBULATORY_CARE_PROVIDER_SITE_OTHER): Payer: 59 | Admitting: Diagnostic Neuroimaging

## 2017-02-12 ENCOUNTER — Encounter: Payer: Self-pay | Admitting: Diagnostic Neuroimaging

## 2017-02-12 VITALS — BP 114/70 | HR 68 | Wt 212.0 lb

## 2017-02-12 DIAGNOSIS — G7000 Myasthenia gravis without (acute) exacerbation: Secondary | ICD-10-CM | POA: Diagnosis not present

## 2017-02-12 DIAGNOSIS — G4733 Obstructive sleep apnea (adult) (pediatric): Secondary | ICD-10-CM

## 2017-02-12 DIAGNOSIS — H532 Diplopia: Secondary | ICD-10-CM | POA: Diagnosis not present

## 2017-02-12 DIAGNOSIS — R252 Cramp and spasm: Secondary | ICD-10-CM | POA: Diagnosis not present

## 2017-02-12 MED ORDER — PYRIDOSTIGMINE BROMIDE 60 MG PO TABS: 60.0000 mg | ORAL_TABLET | Freq: Two times a day (BID) | ORAL | 4 refills | Status: DC

## 2017-02-12 NOTE — Progress Notes (Signed)
[facility]  PATIENT: Jonathan Mcgee DOB: 01/02/1951  REFERRING CLINICIAN: Deliah Boston, PA-c HISTORY FROM: patient  REASON FOR VISIT: follow up     HISTORICAL  CHIEF COMPLAINT:  Chief Complaint  Patient presents with  . Myasthenia gravis    rm 7, "I have good days and bad days. taking Mestinon only twice daily"  . Follow-up    6 weeks    HISTORY OF PRESENT ILLNESS:   UPDATE 02/12/17: Since last visit, BP is better (meds have been adjusted). Now has CPAP machine (about to start). Sugars are better now (averaging ~150's). Intermittent double vision continues. Mild generalized fatigue, but no focal weakness.   UPDATE 12/31/16: Since last visit, IVIG completed, and general energy little better, but muscle cramps worse. BP running high. He has not checked sugars lately. PSG showed mod-severe OSA, and CPAP titration study is pending. Tolerating mestinon 60mg  BID.   UPDATE 10/09/16: Since last visit, double vision is slightly better on mestinon 60mg  TID. However having more muscle cramps, excess saliva production. Fatigable weakness is persistent.   PRIOR HPI (08/28/16): 66 year old ambidextrous male with hypertension, diabetes, hypercholesterolemia here for evaluation of myasthenia gravis. For past 1-2 years patient has had mild intermittent right-sided ptosis. In May 2017 he noticed onset of double vision, blurred vision, wavy vision. This is progressively worsened. For past 1 month he has had increasing fatigue with walking. Is having difficulty with upper and lower extremity strength. He denies any speech, swallowing, breathing difficulty. His balance has been slightly off. Patient went to PCP, ophthalmology, had [facility] antibody testing which confirmed diagnosis of myasthenia gravis.   REVIEW OF SYSTEMS: Full 14 system review of systems performed and negative with exception of: only as per HPI.   ALLERGIES: Allergies  Allergen Reactions  . Lisinopril Swelling   Facial swelling    HOME MEDICATIONS: Outpatient Medications Prior to Visit  Medication Sig Dispense Refill  . atorvastatin (LIPITOR) 20 MG tablet Take 1 tablet (20 mg total) by mouth daily. 90 tablet 1  . losartan-hydrochlorothiazide (HYZAAR) 100-25 MG tablet Take 1 tablet by mouth daily. 90 tablet 1  . metFORMIN (GLUCOPHAGE) 1000 MG tablet Take 1 tablet (1,000 mg total) by mouth 2 (two) times daily with a meal. 180 tablet 1  . pyridostigmine (MESTINON) 60 MG tablet Take 0.5-1 tablets (30-60 mg total) by mouth 3 (three) times daily. 90 tablet 6  . amLODipine (NORVASC) 5 MG tablet Take 1 tablet (5 mg total) by mouth daily. (Patient not taking: Reported on 02/12/2017) 90 tablet 0   No facility-administered medications prior to visit.     PAST MEDICAL HISTORY: Past Medical History:  Diagnosis Date  . Arthritis   . Diabetes mellitus without complication (HCC)   . Hypercholesterolemia   . Hypertension   . Myasthenia gravis (HCC)     PAST SURGICAL HISTORY: Past Surgical History:  Procedure Laterality Date  . KNEE SURGERY Right    fractured patella > 20 yrs ago  . PENILE PROSTHESIS IMPLANT  2015    FAMILY HISTORY: Family History  Problem Relation Age of Onset  . Diabetes Mother   . Heart disease Mother   . Hyperlipidemia Mother   . Heart attack Mother   . Hyperlipidemia Father   . Kidney failure Father   . Drug abuse Maternal Grandmother   . Heart disease Maternal Grandmother   . Hypertension Maternal Grandmother   . Hyperlipidemia Maternal Grandmother   . Diabetes Maternal Grandmother     SOCIAL HISTORY:  Social History   Social History  . Marital status: Single    Spouse name: N/A  . Number of children: 1  . Years of education: 4   Occupational History  .      [employer]   Social History Main Topics  . Smoking status: Current Some Day Smoker    Packs/day: 1.00    Types: Cigars  . Smokeless tobacco: Never Used  . Alcohol use Yes     Comment: occas  . Drug use:  No  . Sexual activity: Not on file   Other Topics Concern  . Not on file   Social History Narrative   Drinks 2-3 caffeine drinks a day      PHYSICAL EXAM  GENERAL EXAM/CONSTITUTIONAL: Vitals:  Vitals:   02/12/17 1322  BP: 114/70  Pulse: 68  Weight: 212 lb (96.2 kg)   Body mass index is 29.99 kg/m. No exam data present  Patient is in no distress; well developed, nourished and groomed; neck is supple  CARDIOVASCULAR:  Examination of carotid arteries is normal; no carotid bruits  Regular rate and rhythm, no murmurs  Examination of peripheral vascular system by observation and palpation is normal  EYES:  Ophthalmoscopic exam of optic discs and posterior segments is normal; no papilledema or hemorrhages  MUSCULOSKELETAL:  Gait, strength, tone, movements noted in Neurologic exam below  NEUROLOGIC: MENTAL STATUS:  No flowsheet data found.  awake, alert, oriented to person, place and time  recent and remote memory intact  normal attention and concentration  language fluent, comprehension intact, naming intact,   fund of knowledge appropriate  CRANIAL NERVE:   2nd - no papilledema on fundoscopic exam  2nd, 3rd, 4th, 6th - pupils equal and reactive to light, visual fields full to confrontation, extraocular muscles --> DOUBLE VISION ON LEFT AND RIGHT GAZE; DECR MOVEMENT IN UPGAZE; NO PTOSIS; no nystagmus  5th - facial sensation symmetric  7th - facial strength symmetric  8th - hearing intact  9th - palate elevates symmetrically, uvula midline  11th - shoulder shrug symmetric  12th - tongue protrusion midline  MOTOR:   normal bulk and tone, full strength in the BUE, BLE  ABLE TO STAND UP WITH ARMS FOLDED  SENSORY:   normal and symmetric to light touch, temperature, vibration  COORDINATION:   finger-nose-finger, fine finger movements normal  REFLEXES:   deep tendon reflexes TRACE and symmetric; ABSENT AT KNEES AND ANKLES  GAIT/STATION:     narrow based gait; ABLE TO WALK ON TOES AND HEEL; ROMBERG NEGATIVE    DIAGNOSTIC DATA (LABS, IMAGING, TESTING) - I reviewed patient records, labs, notes, testing and imaging myself where available.  Lab Results  Component Value Date   WBC 7.6 12/31/2016   HGB 13.4 (A) 06/29/2016   HCT 36.3 (L) 12/31/2016   MCV 96 12/31/2016   PLT 231 12/31/2016      Component Value Date/Time   NA 144 01/13/2017 1643   K 4.5 01/13/2017 1643   CL 105 01/13/2017 1643   CO2 21 01/13/2017 1643   GLUCOSE 148 (H) 01/13/2017 1643   GLUCOSE 115 (H) 06/29/2016 1626   BUN 18 01/13/2017 1643   CREATININE 1.46 (H) 01/13/2017 1643   CREATININE 1.39 (H) 06/29/2016 1626   CALCIUM 9.1 01/13/2017 1643   PROT 7.4 12/31/2016 1508   ALBUMIN 4.3 12/31/2016 1508   AST 14 12/31/2016 1508   ALT 12 12/31/2016 1508   ALKPHOS 93 12/31/2016 1508   BILITOT 0.2 12/31/2016 1508  GFRNONAA 49 (L) 01/13/2017 1643   GFRNONAA 53 (L) 06/29/2016 1626   GFRAA 57 (L) 01/13/2017 1643   GFRAA 61 06/29/2016 1626   Lab Results  Component Value Date   CHOL 133 01/13/2017   HDL 34 (L) 01/13/2017   LDLCALC 60 01/13/2017   TRIG 197 (H) 01/13/2017   CHOLHDL 3.9 01/13/2017   Lab Results  Component Value Date   HGBA1C 6.4 (H) 12/31/2016   No results found for: VITAMINB12 Lab Results  Component Value Date   TSH 3.39 06/29/2016    07/04/16 CT head [I reviewed images myself and agree with interpretation. -[facility]]  - Mild microvascular disease without acute intracranial process.  09/04/16 CT chest  1. No mediastinal mass or adenopathy. 2. No infiltrate or pulmonary edema. 3. Degenerative changes thoracic spine. 4. Atherosclerotic calcifications of thoracic aorta. 5. Mild fatty infiltration of the liver.     ASSESSMENT AND PLAN  66 y.o. year old male here with new onset diagnosis of myasthenia gravis with ocular and generalized features. Was having side effects from mestinon, but also some improvement in ocular  symptoms. IVIG has helped generalized symptoms.    Dx: (ocular >> generalized) myasthenia gravis  1. MG (myasthenia gravis) (HCC)   2. Double vision   3. Muscle cramps   4. OSA (obstructive sleep apnea)      PLAN:  - continue pyridostigmine 60mg  twice a day  - monitor for generalized myasthenia gravis symptoms; patient is slightly better now, so will hold off on prednisone for now  - in future may consider prednisone 60mg  daily for longer term treatment of myasthenia gravis; also would then consider prednisone taper +/- steroid sparing therapy (imuran, cellcept or soliris)  - continue BP and DM control  - follow up CPAP usage  Meds ordered this encounter  Medications  . pyridostigmine (MESTINON) 60 MG tablet    Sig: Take 1 tablet (60 mg total) by mouth 2 (two) times daily.    Dispense:  180 tablet    Refill:  4   Return in about 2 months (around 04/14/2017).     , MD 02/12/2017, 1:35 PM Certified in Neurology, Neurophysiology and Neuroimaging  [facility] [address], [address] [place], [address] [place] [phone]

## 2017-02-12 NOTE — Patient Instructions (Signed)
-   continue pyridostigmine 60mg  twice a day  - monitor for generalized myasthenia gravis symptoms  - continue BP and DM control  - follow up CPAP usage

## 2017-03-18 ENCOUNTER — Encounter: Payer: Self-pay | Admitting: Diagnostic Neuroimaging

## 2017-03-19 ENCOUNTER — Other Ambulatory Visit: Payer: Self-pay | Admitting: Nephrology

## 2017-03-19 DIAGNOSIS — N183 Chronic kidney disease, stage 3 unspecified: Secondary | ICD-10-CM

## 2017-03-28 ENCOUNTER — Other Ambulatory Visit: Payer: Managed Care, Other (non HMO)

## 2017-04-01 ENCOUNTER — Encounter: Payer: Self-pay | Admitting: Neurology

## 2017-04-03 ENCOUNTER — Ambulatory Visit (INDEPENDENT_AMBULATORY_CARE_PROVIDER_SITE_OTHER): Payer: 59 | Admitting: Neurology

## 2017-04-03 ENCOUNTER — Other Ambulatory Visit: Payer: Managed Care, Other (non HMO)

## 2017-04-03 ENCOUNTER — Encounter: Payer: Self-pay | Admitting: Neurology

## 2017-04-03 VITALS — BP 160/54 | HR 80 | Resp 16 | Ht 70.5 in | Wt 216.0 lb

## 2017-04-03 DIAGNOSIS — Z9989 Dependence on other enabling machines and devices: Secondary | ICD-10-CM

## 2017-04-03 DIAGNOSIS — G4733 Obstructive sleep apnea (adult) (pediatric): Secondary | ICD-10-CM | POA: Diagnosis not present

## 2017-04-03 NOTE — Progress Notes (Signed)
Subjective:    Mcgee ID: Jonathan Mcgee is a 66 y.o. male.  HPI     Interim history:   Jonathan Mcgee is a 66 year old right-handed gentleman with an underlying medical history of diabetes, hypertension, hyperlipidemia, smoking, arthritis, obesity, and recent diagnosis of myasthenia gravis, who presents for follow-up consultation of Jonathan Mcgee obstructive sleep apnea, after sleep study testing. Jonathan Mcgee is unaccompanied today. I first met Jonathan Mcgee on 09/17/2016 at Jonathan request of Dr. Leta Baptist, at which time Jonathan Mcgee reported a prior diagnosis of obstructive sleep apnea but Jonathan Mcgee was no longer on CPAP therapy for about 3 years. I invited Jonathan Mcgee for sleep study. Jonathan Mcgee had a baseline sleep study, followed by a CPAP titration study. I went over Jonathan Mcgee test results with Jonathan Mcgee in detail today. Jonathan Mcgee baseline sleep study from 11/07/2016 showed a sleep efficiency of 79.8%, sleep latency 47 minutes, REM latency 61.5 minutes. Jonathan Mcgee had an increased percentage of stage I sleep, normal percentage of stage II sleep, slow-wave sleep at 11.6% and REM sleep at 24.6%. Jonathan Mcgee had mild to moderate snoring. EKG showed normal sinus rhythm, overall AHI was 20 per hour, REM AHI was 53.2 per hour, average oxygen saturation 97%, nadir was 71%, time below 89% saturation was 37 minutes, Jonathan Mcgee had mild PLMS with an index of 28.4 per hour, with no significant arousals. Based on Jonathan Mcgee test results in medical history as well as sleep-related complaints I invited Jonathan Mcgee for a full night CPAP titration study. Jonathan Mcgee had this on 01/06/2017. Sleep efficiency was 89.6%, sleep latency 6 minutes, REM latency 137 minutes. Jonathan Mcgee had an increased percentage of stage II sleep, absence of slow-wave sleep and REM sleep was 19.9%. Jonathan Mcgee was fitted with a nasal pillows interface, CPAP was titrated from 5 cm to 11 cm. On Jonathan final pressure Jonathan Mcgee AHI was 0 per hour with supine sleep achieved an O2 nadir of 92%. Jonathan Mcgee had no significant PLMS. Average oxygen saturation was 92%, nadir was 73%, time below 89%  saturation was 23 minutes for Jonathan night. Based on Jonathan Mcgee test results I prescribed CPAP therapy for home use.  Today, 04/03/2017 (all dictated new, as well as above notes, some dictation done in note pad or Word, outside of chart, may appear as copied):  I reviewed Jonathan Mcgee CPAP compliance data from 03/03/2017 through 04/01/2017, which is a total of 30 days, during which time Jonathan Mcgee used Jonathan Mcgee CPAP 23 days with percent used days greater than 4 hours at 67%, indicating suboptimal compliance with an average usage of 5 hours and 10 minutes, residual AHI 3.7 per hour, leak on Jonathan high side with Jonathan 95th percentile at 34.9 L/m on a pressure of 11 cm with EPR of 3. In Jonathan past 57 days Jonathan Mcgee compliance was 51%. Jonathan Mcgee reports doing okay, feels a little better in terms of sleep quality and sleep consolidation, daytime energy a little better. Jonathan Mcgee travels quite a bit and does not take Jonathan Mcgee machine with Jonathan Mcgee as it is a nuisance.   Jonathan Mcgee's allergies, current medications, family history, past medical history, past social history, past surgical history and problem list were reviewed and updated as appropriate.   Previously (copied from previous notes for reference):   09/17/2016: Jonathan Mcgee reports snoring, EDS and witnessed apneas, carries a prior diagnosis of OSA and was on CPAP therapy. Jonathan Mcgee Epworth sleepiness score is 11 out of 24 today, Jonathan Mcgee fatigue score is 28 out of 63. I reviewed your office note from 08/28/2016.  Jonathan Mcgee was previously diagnosed with obstructive sleep apnea  several years ago. [place] some 6[place]7 years ago. Study results are not available for my review today. Jonathan Mcgee has not been on CPAP therapy in about 3 years. Jonathan Mcgee has a bedtime of 11 PM, and wake time around 4:30 to 6:30.  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 Lives alone, one son in [place].Mcgee is divorced. No RLS, no PLMs. Jonathan Mcgee drinks alcohol occasionally, drinks 2-3 cups of coffee per day, smokes 1 cigar per day, not a cigarette smoker. Jonathan Mcgee used to weigh  more when Jonathan Mcgee had Jonathan Mcgee sleep study done several years ago, around 250 pounds. Snoring is loud and has apneas. Had TE as a child. No FHx of OSA. No nightly nocturia. No pets, has TV in bedroom, prefers Jonathan noise.     Jonathan Mcgee Past Medical History Is Significant For: Past Medical History:  Diagnosis Date  . Arthritis   . Diabetes mellitus without complication ([place])   . Hypercholesterolemia   . Hypertension   . Myasthenia gravis ([place])     Jonathan Mcgee Past Surgical History Is Significant For: Past Surgical History:  Procedure Laterality Date  . KNEE SURGERY Right    fractured patella > 20 yrs ago  . PENILE PROSTHESIS IMPLANT  2015    Jonathan Mcgee Family History Is Significant For: Family History  Problem Relation Age of Onset  . Diabetes Mother   . Heart disease Mother   . Hyperlipidemia Mother   . Heart attack Mother   . Hyperlipidemia Father   . Kidney failure Father   . Drug abuse Maternal Grandmother   . Heart disease Maternal Grandmother   . Hypertension Maternal Grandmother   . Hyperlipidemia Maternal Grandmother   . Diabetes Maternal Grandmother     Jonathan Mcgee Social History Is Significant For: Social History   Social History  . Marital status: Single    Spouse name: N/A  . Number of children: 1  . Years of education: 17   Occupational History  .      [employer]   Social History Main Topics  . Smoking status: Current Some Day Smoker    Packs/day: 1.00    Types: Cigars  . Smokeless tobacco: Never Used  . Alcohol use Yes     Comment: occas  . Drug use: No  . Sexual activity: Not Asked   Other Topics Concern  . None   Social History Narrative   Drinks 2-3 caffeine drinks a day     Jonathan Mcgee Allergies Are:  Allergies  Allergen Reactions  . Lisinopril Swelling    Facial swelling  :   Jonathan Mcgee Current Medications Are:  Outpatient Encounter Prescriptions as of 04/03/2017  Medication Sig  . amLODipine (NORVASC) 5 MG tablet Take 1 tablet (5 mg total) by mouth daily.  Marland Kitchen atorvastatin (LIPITOR) 20  MG tablet Take 1 tablet (20 mg total) by mouth daily.  Marland Kitchen losartan-hydrochlorothiazide (HYZAAR) 100-25 MG tablet Take 1 tablet by mouth daily.  . metFORMIN (GLUCOPHAGE) 1000 MG tablet Take 1 tablet (1,000 mg total) by mouth 2 (two) times daily with a meal.  . pyridostigmine (MESTINON) 60 MG tablet Take 1 tablet (60 mg total) by mouth 2 (two) times daily.   No facility-administered encounter medications on file as of 04/03/2017.   :  Review of Systems:  Out of a complete 14 point review of systems, all are reviewed and negative with Jonathan exception of these symptoms as listed below: Review of Systems  Neurological:       Mcgee states that  Jonathan Mcgee is doing ok with CPAP. No new concerns.     Objective:  Neurologic Exam  Physical Exam Physical Examination:   Vitals:   04/03/17 1521  BP: (!) 160/54  Pulse: 80  Resp: 16   General Examination: Jonathan Mcgee is a 66 y.o. male in no acute distress. Jonathan Mcgee appears well-developed and well-nourished and well groomed.   HEENT: Normocephalic, atraumatic, pupils are equal, round and reactive to light and accommodation. Extraocular tracking is good without limitation to gaze excursion or nystagmus noted. Normal smooth pursuit is noted. Hearing is grossly intact. Bilateral proptosis is noted, right more than left, stable. Speech is clear with no dysarthria noted. There is no or rather mild hypophonia. There is no lip, neck/head, jaw or voice tremor. Neck is supple with full range of passive and active motion. There are no carotid bruits on auscultation. Oropharynx exam reveals: mild mouth dryness, adequate dental hygiene and moderate airway crowding. Tongue protrudes centrally and palate elevates symmetrically. Tonsils are absent.    Chest: Clear to auscultation without wheezing, rhonchi or crackles noted.  Heart: S1+S2+0, regular and normal without murmurs, rubs or gallops noted.   Abdomen: Soft, non-tender and non-distended with normal bowel sounds  appreciated on auscultation.  Skin: No obvious change.  Musculoskeletal: no obvious change.   Neurologically:  Mental status: Jonathan Mcgee is awake, alert and oriented in all 4 spheres. Jonathan Mcgee immediate and remote memory, attention, language skills and fund of knowledge are appropriate. There is no evidence of aphasia, agnosia, apraxia or anomia. Speech is clear with normal prosody and enunciation. Thought process is linear. Mood is constricted, affect flat.  Cranial nerves II - XII are as described above under HEENT exam.  Motor exam: No gross changes. There is no truncal or gait ataxia.  Gait, station and balance: Jonathan Mcgee stands easily. No veering to one side is noted. No leaning to one side is noted. Posture is age-appropriate and stance is narrow based. Gait shows normal stride length and normal pace. No problems turning are noted.   Assessment and Plan:   In summary, Bj Morlock is a 66 year old male with an underlying medical history of diabetes, hypertension, hyperlipidemia, smoking, arthritis, obesity, and recent diagnosis of myasthenia gravis, who Presents for follow-up consultation of Jonathan Mcgee obstructive sleep apnea. Jonathan Mcgee baseline sleep study from 10/25/2016 confirmed moderate to severe obstructive sleep apnea with severe REM related desaturations, nadir of 71%. Jonathan Mcgee overall AHI was 20 per hour, REM AHI 53.2 per hour. Jonathan Mcgee had a CPAP titration study on 01/06/2017. Jonathan Mcgee did well with CPAP of 11. We reviewed Jonathan Mcgee compliance data for Jonathan past 30 days and 57 days. Jonathan Mcgee is not yet fully compliant with treatment and is highly encouraged to be fully compliant with CPAP therapy. Jonathan Mcgee most recent compliance percentage is 67%. I explained Jonathan importance of treating moderate to severe obstructive sleep apnea, especially with respect to decreasing hopefully cardiovascular complication wrist down [address]. Furthermore, suboptimal compliance with CPAP may lead to insurance stopping payment on Jonathan machine or supplies. Jonathan Mcgee  is advised to follow-up with one of our nurse practitioners for recheck on Jonathan Mcgee compliance and symptoms in about 3 months, sooner if needed. Jonathan Mcgee is also advised to check in with Jonathan Mcgee DME company regarding Jonathan Mcgee air leakage which is on Jonathan high side. In addition, Jonathan Mcgee may benefit from purchasing a second CPAP machine for travel purposes as Jonathan Mcgee travels quite a bit and it is a nuisance to pack up Jonathan CPAP machine and trouble with  it. I answered all Jonathan Mcgee questions today and Jonathan Mcgee was in agreement.  I spent 15 minutes in total face-to-face time with Jonathan Mcgee, more than 50% of which was spent in counseling and coordination of care, reviewing test results, reviewing medication and discussing or reviewing Jonathan diagnosis of OSA, its prognosis and treatment options. Pertinent laboratory and imaging test results that were available during this visit with Jonathan Mcgee were reviewed by me and considered in my medical decision making (see chart for details).

## 2017-04-03 NOTE — Patient Instructions (Addendum)
Please continue using your CPAP regularly. While your insurance requires that you use CPAP at least 4 hours each night on 70% of the nights, I recommend, that you not skip any nights and use it throughout the night if you can. Getting used to CPAP and staying with the treatment long term does take time and patience and discipline. Untreated obstructive sleep apnea when it is moderate to severe can have an adverse impact on cardiovascular health and raise her risk for heart disease, arrhythmias, hypertension, congestive heart failure, stroke and diabetes. Untreated obstructive sleep apnea causes sleep disruption, nonrestorative sleep, and sleep deprivation. This can have an impact on your day to day functioning and cause daytime sleepiness and impairment of cognitive function, memory loss, mood disturbance, and problems focussing. Using CPAP regularly can improve these symptoms. I will prescribe a second CPAP machine for travel.  Follow up with NP in about 3 months.

## 2017-04-10 ENCOUNTER — Ambulatory Visit: Payer: 59 | Admitting: Family Medicine

## 2017-04-12 ENCOUNTER — Ambulatory Visit (INDEPENDENT_AMBULATORY_CARE_PROVIDER_SITE_OTHER): Payer: 59 | Admitting: Family Medicine

## 2017-04-12 ENCOUNTER — Encounter: Payer: Self-pay | Admitting: Family Medicine

## 2017-04-12 VITALS — BP 143/71 | HR 55 | Temp 99.0°F | Resp 16 | Ht 70.5 in | Wt 214.0 lb

## 2017-04-12 DIAGNOSIS — I10 Essential (primary) hypertension: Secondary | ICD-10-CM

## 2017-04-12 DIAGNOSIS — F329 Major depressive disorder, single episode, unspecified: Secondary | ICD-10-CM | POA: Diagnosis not present

## 2017-04-12 DIAGNOSIS — E1122 Type 2 diabetes mellitus with diabetic chronic kidney disease: Secondary | ICD-10-CM | POA: Diagnosis not present

## 2017-04-12 DIAGNOSIS — N189 Chronic kidney disease, unspecified: Secondary | ICD-10-CM

## 2017-04-12 MED ORDER — AMLODIPINE BESYLATE 5 MG PO TABS: 5.0000 mg | ORAL_TABLET | Freq: Every day | ORAL | 0 refills | Status: DC

## 2017-04-12 NOTE — Patient Instructions (Addendum)
For blood pressure, if your numbers remain over 140/90, can increase to 1-1/2 amlodipine pills per day. Continue losartan HCTZ at the same dose.  For diabetes, continue metformin twice per day, I will check her labs today.  Keep follow-up with neurologist and kidney specialist.   As we discussed I'm concerned you are suffering from depression or at the minimum adjustment disorder. I am happy to write you out temporarily from work if needed or complete FMLA paperwork if that is needed for you to get some rest.  I would also consider meeting with a therapist and possibly starting an antidepressant to help with fatigue and depression symptoms. Phone numbers for therapists given below, let me know if you would like to start a medication. Also see information on stress and stress management below to see if there may be some items to help. Follow-up with me in the next 6 weeks to discuss the symptoms further, sooner if needed.  If any suicidal thoughts, call 911 or go to emergency room.  Vivia Budge: [phone] [place]vil [place]: [phone]   Major Depressive Disorder, Adult Major depressive disorder (MDD) is a mental health condition. It may also be called clinical depression or unipolar depression. MDD usually causes feelings of sadness, hopelessness, or helplessness. MDD can also cause physical symptoms. It can interfere with work, school, relationships, and other everyday activities. MDD may be mild, moderate, or severe. It may occur once (single episode major depressive disorder) or it may occur multiple times (recurrent major depressive disorder). What are the causes? The exact cause of this condition is not known. MDD is most likely caused by a combination of things, which may include:  Genetic factors. These are traits that are passed along from parent to child.  Individual factors. Your personality, your behavior, and the way you handle your thoughts and feelings may contribute to MDD. This  includes personality traits and behaviors learned from others.  Physical factors, such as:  Differences in the part of your brain that controls emotion. This part of your brain may be different than it is in people who do not have MDD.  Long-term (chronic) medical or psychiatric illnesses.  Social factors. Traumatic experiences or major life changes may play a role in the development of MDD. What increases the risk? This condition is more likely to develop in women. The following factors may also make you more likely to develop MDD:  A family history of depression.  Troubled family relationships.  Abnormally low levels of certain brain chemicals.  Traumatic events in childhood, especially abuse or the loss of a parent.  Being under a lot of stress, or long-term stress, especially from upsetting life experiences or losses.  A history of:  Chronic physical illness.  Other mental health disorders.  Substance abuse.  Poor living conditions.  Experiencing social exclusion or discrimination on a regular basis. What are the signs or symptoms? The main symptoms of MDD typically include:  Constant depressed or irritable mood.  Loss of interest in things and activities. MDD symptoms may also include:  Sleeping or eating too much or too little.  Unexplained weight change.  Fatigue or low energy.  Feelings of worthlessness or guilt.  Difficulty thinking clearly or making decisions.  Thoughts of suicide or of harming others.  Physical agitation or weakness.  Isolation. Severe cases of MDD may also occur with other symptoms, such as:  Delusions or hallucinations, in which you imagine things that are not real (psychotic depression).  Low-level depression that  lasts at least a year (chronic depression or persistent depressive disorder).  Extreme sadness and hopelessness (melancholic depression).  Trouble speaking and moving (catatonic depression). How is this  diagnosed? This condition may be diagnosed based on:  Your symptoms.  Your medical history, including your mental health history. This may involve tests to evaluate your mental health. You may be asked questions about your lifestyle, including any drug and alcohol use, and how long you have had symptoms of MDD.  A physical exam.  Blood tests to rule out other conditions. You must have a depressed mood and at least four other MDD symptoms most of the day, nearly every day in the same 2-week timeframe before your health care provider can confirm a diagnosis of MDD. How is this treated? This condition is usually treated by mental health professionals, such as psychologists, psychiatrists, and clinical social workers. You may need more than one type of treatment. Treatment may include:  Psychotherapy. This is also called talk therapy or counseling. Types of psychotherapy include:  Cognitive behavioral therapy (CBT). This type of therapy teaches you to recognize unhealthy feelings, thoughts, and behaviors, and replace them with positive thoughts and actions.  Interpersonal therapy (IPT). This helps you to improve the way you relate to and communicate with others.  Family therapy. This treatment includes members of your family.  Medicine to treat anxiety and depression, or to help you control certain emotions and behaviors.  Lifestyle changes, such as:  Limiting alcohol and drug use.  Exercising regularly.  Getting plenty of sleep.  Making healthy eating choices.  Spending more time outdoors. Treatments involving stimulation of the brain can be used in situations with extremely severe symptoms, or when medicine or other therapies do not work over time. These treatments include electroconvulsive therapy, transcranial magnetic stimulation, and vagal nerve stimulation. Follow these instructions at home: Activity   Return to your normal activities as told by your health care  provider.  Exercise regularly and spend time outdoors as told by your health care provider. General instructions   Take over-the-counter and prescription medicines only as told by your health care provider.  Do not drink alcohol. If you drink alcohol, limit your alcohol intake to no more than 1 drink a day for nonpregnant women and 2 drinks a day for men. One drink equals 12 oz of beer, 5 oz of wine, or 1 oz of hard liquor. Alcohol can affect any antidepressant medicines you are taking. Talk to your health care provider about your alcohol use.  Eat a healthy diet and get plenty of sleep.  Find activities that you enjoy doing, and make time to do them.  Consider joining a support group. Your health care provider may be able to recommend a support group.  Keep all follow-up visits as told by your health care provider. This is important. Where to find more information: [employer] on Mental Illness  www.nami.org [place] National Institute of Mental Health  https://carter.com/ National Suicide Prevention Lifeline  1-800-273-TALK 708-163-9551). This is free, 24-hour help. Contact a health care provider if:  Your symptoms get worse.  You develop new symptoms. Get help right away if:  You self-harm.  You have serious thoughts about hurting yourself or others.  You see, hear, taste, smell, or feel things that are not present (hallucinate). This information is not intended to replace advice given to you by your health care provider. Make sure you discuss any questions you have with your health care provider. Document Released: 03/08/2013  Document Revised: 07/18/2016 Document Reviewed: 05/22/2016 Elsevier Interactive Patient Education  2017 [place] and Stress Management Stress is a normal reaction to life events. It is what you feel when life demands more than you are used to or more than you can handle. Some stress can be useful. For example, the stress reaction can help  you catch the last bus of the day, study for a test, or meet a deadline at work. But stress that occurs too often or for too long can cause problems. It can affect your emotional health and interfere with relationships and normal daily activities. Too much stress can weaken your immune system and increase your risk for physical illness. If you already have a medical problem, stress can make it worse. What are the causes? All sorts of life events may cause stress. An event that causes stress for one person may not be stressful for another person. Major life events commonly cause stress. These may be positive or negative. Examples include losing your job, moving into a new home, getting married, having a baby, or losing a loved one. Less obvious life events may also cause stress, especially if they occur day after day or in combination. Examples include working long hours, driving in traffic, caring for children, being in debt, or being in a difficult relationship. What are the signs or symptoms? Stress may cause emotional symptoms including, the following:  Anxiety. This is feeling worried, afraid, on edge, overwhelmed, or out of control.  Anger. This is feeling irritated or impatient.  Depression. This is feeling sad, down, helpless, or guilty.  Difficulty focusing, remembering, or making decisions. Stress may cause physical symptoms, including the following:  Aches and pains. These may affect your head, neck, back, stomach, or other areas of your body.  Tight muscles or clenched jaw.  Low energy or trouble sleeping. Stress may cause unhealthy behaviors, including the following:  Eating to feel better (overeating) or skipping meals.  Sleeping too little, too much, or both.  Working too much or putting off tasks (procrastination).  Smoking, drinking alcohol, or using drugs to feel better. How is this diagnosed? Stress is diagnosed through an assessment by your health care provider. Your  health care provider will ask questions about your symptoms and any stressful life events.Your health care provider will also ask about your medical history and may order blood tests or other tests. Certain medical conditions and medicine can cause physical symptoms similar to stress. Mental illness can cause emotional symptoms and unhealthy behaviors similar to stress. Your health care provider may refer you to a mental health professional for further evaluation. How is this treated? Stress management is the recommended treatment for stress.The goals of stress management are reducing stressful life events and coping with stress in healthy ways. Techniques for reducing stressful life events include the following:  Stress identification. Self-monitor for stress and identify what causes stress for you. These skills may help you to avoid some stressful events.  Time management. Set your priorities, keep a calendar of events, and learn to say "no." These tools can help you avoid making too many commitments. Techniques for coping with stress include the following:  Rethinking the problem. Try to think realistically about stressful events rather than ignoring them or overreacting. Try to find the positives in a stressful situation rather than focusing on the negatives.  Exercise. Physical exercise can release both physical and emotional tension. The key is to find a form of exercise you  enjoy and do it regularly.  Relaxation techniques. These relax the body and mind. Examples include yoga, meditation, tai chi, biofeedback, deep breathing, progressive muscle relaxation, listening to music, being out in nature, journaling, and other hobbies. Again, the key is to find one or more that you enjoy and can do regularly.  Healthy lifestyle. Eat a balanced diet, get plenty of sleep, and do not smoke. Avoid using alcohol or drugs to relax.  Strong support network. Spend time with family, friends, or other people  you enjoy being around.Express your feelings and talk things over with someone you trust. Counseling or talktherapy with a mental health professional may be helpful if you are having difficulty managing stress on your own. Medicine is typically not recommended for the treatment of stress.Talk to your health care provider if you think you need medicine for symptoms of stress. Follow these instructions at home:  Keep all follow-up visits as directed by your health care provider.  Take all medicines as directed by your health care provider. Contact a health care provider if:  Your symptoms get worse or you start having new symptoms.  You feel overwhelmed by your problems and can no longer manage them on your own. Get help right away if:  You feel like hurting yourself or someone else. This information is not intended to replace advice given to you by your health care provider. Make sure you discuss any questions you have with your health care provider. Document Released: 05/07/2001 Document Revised: 04/18/2016 Document Reviewed: 07/06/2013 Elsevier Interactive Patient Education  2017 [employer].   IF you received an x-ray today, you will receive an invoice from [facility]. Please contact [facility] at [phone] with questions or concerns regarding your invoice.   IF you received labwork today, you will receive an invoice from [place]. Please contact [facility] at [phone] with questions or concerns regarding your invoice.   Our billing staff will not be able to assist you with questions regarding bills from these companies.  You will be contacted with the lab results as soon as they are available. The fastest way to get your results is to activate your My Chart account. Instructions are located on the last page of this paperwork. If you have not heard from [place] regarding the results in 2 weeks, please contact this office.

## 2017-04-12 NOTE — Progress Notes (Signed)
Subjective:  By signing my name below, I, Moises Blood, attest that this documentation has been prepared under the direction and in the presence of Merri Ray, MD. Electronically Signed: Moises Blood, [place]. 04/12/2017 , 8:50 AM .  Patient was seen in Room 1 .   Patient ID: Jonathan Mcgee, male    DOB: [birth year], 66 y.o.   MRN: [id number] Chief Complaint  Patient presents with  . Follow-up    blood pressure and TIIDM    HPI Jonathan Mcgee is a 66 y.o. male Here for follow up of HTN and DM. His last office visit was on March 8th.   Depression He complains feeling tired due to dealing with his symptoms. He was diagnosed with stage 3 kidney disease, and he has fear from his father's death with kidney disease. His nephrology is Dr. Hollie Salk at [facility]. He notes saying, "hope for God to take my life life because I'm too scared to take my own." He reports feeling depressed for about 6 months ~ 1 year. He denies any plans of suicide.   He also states stressful work, working 10-12 hours a day, and going 5-6 days a week. He recently traveled out to [facility] for work, and returned last night. When he was returning, he lost his balance and fell down on an escalator going up. He also had a stressful tight layover in [place], as he had 40 minutes and had to walk over 30 minutes from one concourse to another.   He denies history of taking medications for depression. When he becomes stressed at home, he usually has a cigar and sitting in his patio, as well as watching some TV. He states this makes him feel a little better. He hasn't been able to sleep well, but he hasn't been on his cpap in the past week due to traveling. He also mentions mask leaking some air.   HTN Lab Results  Component Value Date   CREATININE 1.46 (H) 01/13/2017   His creatinine has remained in 1.4 and 1.5 range in the past 6 months. He is on norvasc 56m QD and losartan-HCTZ 100-210mQD. He previously had  increased in losartan dosing, added norvasc 40m33mt last visit.   His home BP has been waxing and waning, as high as 160s. His ankle swelling hasn't worsened with medication. He denies lightheadedness, dizziness, chest pain or shortness of breath.   DM Lab Results  Component Value Date   HGBA1C 6.4 (H) 12/31/2016   His micro albumin was 13.8 on Feb 19th.   He did have elevated readings in neurologist office, but based on A1C and home readings, kept him on metformin 1000m40mD only.   His home blood sugar usually runs in 150s. He denies symptomatic lows.   Myasthenia gravis He is followed by neurology for myasthenia gravis.   Patient Active Problem List   Diagnosis Date Noted  . Essential hypertension 01/13/2017  . Hyperlipidemia 01/13/2017  . Myasthenia gravis (HCC)[place]/02/2016   Past Medical History:  Diagnosis Date  . Arthritis   . Diabetes mellitus without complication (HCC)[place]. Hypercholesterolemia   . Hypertension   . [facility] Past Surgical History:  Procedure Laterality Date  . KNEE SURGERY Right    fractured patella > 20 yrs ago  . PENILE PROSTHESIS IMPLANT  2015   Allergies  Allergen Reactions  . Lisinopril Swelling    Facial swelling   Prior to Admission medications  Medication Sig Start Date End Date Taking? Authorizing Provider  amLODipine (NORVASC) 5 MG tablet Take 1 tablet (5 mg total) by mouth daily. 02/04/17  Yes Wendie Agreste, MD  atorvastatin (LIPITOR) 20 MG tablet Take 1 tablet (20 mg total) by mouth daily. 01/13/17  Yes Wendie Agreste, MD  losartan-hydrochlorothiazide (HYZAAR) 100-25 MG tablet Take 1 tablet by mouth daily. 01/13/17  Yes Wendie Agreste, MD  metFORMIN (GLUCOPHAGE) 1000 MG tablet Take 1 tablet (1,000 mg total) by mouth 2 (two) times daily with a meal. 01/13/17  Yes Wendie Agreste, MD  pyridostigmine (MESTINON) 60 MG tablet Take 1 tablet (60 mg total) by mouth 2 (two) times daily. 02/12/17  Yes Penumalli, Earlean Polka, MD   Social History   Social History  . Marital status: Single    Spouse name: N/A  . Number of children: 1  . Years of education: 75   Occupational History  .      [employer]   Social History Main Topics  . Smoking status: Current Some Day Smoker    Packs/day: 1.00    Types: Cigars  . Smokeless tobacco: Never Used  . Alcohol use Yes     Comment: occas  . Drug use: No  . Sexual activity: Not on file   Other Topics Concern  . Not on file   Social History Narrative   Drinks 2-3 caffeine drinks a day    Review of Systems  Constitutional: Negative for fatigue and unexpected weight change.  Eyes: Negative for visual disturbance.  Respiratory: Negative for cough, chest tightness and shortness of breath.   Cardiovascular: Negative for chest pain, palpitations and leg swelling.  Gastrointestinal: Negative for abdominal pain and blood in stool.  Neurological: Negative for dizziness, light-headedness and headaches.  Psychiatric/Behavioral: Positive for dysphoric mood. Negative for suicidal ideas.       Objective:   Physical Exam  Constitutional: He is oriented to person, place, and time. He appears well-developed and well-nourished.  HENT:  Head: Normocephalic and atraumatic.  Eyes: EOM are normal. Pupils are equal, round, and reactive to light.  Neck: No JVD present. Carotid bruit is not present.  Cardiovascular: Normal rate, regular rhythm and normal heart sounds.   No murmur heard. Pulmonary/Chest: Effort normal and breath sounds normal. He has no rales.  Musculoskeletal: He exhibits no edema.  Edema at ankles bilaterally, but none in his lower legs  Neurological: He is alert and oriented to person, place, and time.  Skin: Skin is warm and dry.  Psychiatric: He has a normal mood and affect.  Vitals reviewed.   Vitals:   04/12/17 0803  BP: (!) 143/71  Pulse: (!) 55  Resp: 16  Temp: 99 F (37.2 C)  TempSrc: Oral  SpO2: 97%  Weight: 214 lb (97.1 kg)  Height: 5'  10.5" (1.791 m)      Assessment & Plan:    Jonathan Mcgee is a 66 y.o. 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 disease, without long-term current use of insulin, unspecified CKD stage ([place]) - Plan: Care order/instruction:  - Check A1c, continue same dose of metformin for now.  Chronic kidney disease, unspecified CKD stage - Plan: Hemoglobin A1c  -Recently overall stable. Continue follow-up with nephrologist.  Essential hypertension - Plan: amLODipine (NORVASC) 5 MG tablet  -Borderline in office, so advised to increase amlodipine to 7.5 mg if numbers remain over 140/90 at home.  Reactive depression  -Suspected depression versus adjustment disorder with chronic illness.  Denies true suicidal intent or plan. Fleeting thoughts of not having to suffer with current illnesses. Recommended counseling, discussed SSRI, but he would like to hold off on medication at this point. Recheck in 6 weeks.   -Stress management techniques given on handout.   -Suspect some exhaustion with work and his current illnesses, offered to time off of work including FMLA paperwork if needed.  Meds ordered this encounter  Medications  . amLODipine (NORVASC) 5 MG tablet    Sig: Take 1 tablet (5 mg total) by mouth daily.    Dispense:  90 tablet    Refill:  0   Patient Instructions    For blood pressure, if your numbers remain over 140/90, can increase to 1-1/2 amlodipine pills per day. Continue losartan HCTZ at the same dose.  For diabetes, continue metformin twice per day, I will check her labs today.  Keep follow-up with neurologist and kidney specialist.   As we discussed I'm concerned you are suffering from depression or at the minimum adjustment disorder. I am happy to write you out temporarily from work if needed or complete FMLA paperwork if that is needed for you to get some rest.  I would also consider meeting with a therapist and possibly starting an antidepressant to help with fatigue and  depression symptoms. Phone numbers for therapists given below, let me know if you would like to start a medication. Also see information on stress and stress management below to see if there may be some items to help. Follow-up with me in the next 6 weeks to discuss the symptoms further, sooner if needed.  If any suicidal thoughts, call 911 or go to emergency room.  Vivia Budge: [phone] Arvil Chaco: [phone]   Major Depressive Disorder, Adult Major depressive disorder (MDD) is a mental health condition. It may also be called clinical depression or unipolar depression. MDD usually causes feelings of sadness, hopelessness, or helplessness. MDD can also cause physical symptoms. It can interfere with work, school, relationships, and other everyday activities. MDD may be mild, moderate, or severe. It may occur once (single episode major depressive disorder) or it may occur multiple times (recurrent major depressive disorder). What are the causes? The exact cause of this condition is not known. MDD is most likely caused by a combination of things, which may include:  Genetic factors. These are traits that are passed along from parent to child.  Individual factors. Your personality, your behavior, and the way you handle your thoughts and feelings may contribute to MDD. This includes personality traits and behaviors learned from others.  Physical factors, such as:  Differences in the part of your brain that controls emotion. This part of your brain may be different than it is in people who do not have MDD.  Long-term (chronic) medical or psychiatric illnesses.  Social factors. Traumatic experiences or major life changes may play a role in the development of MDD. What increases the risk? This condition is more likely to develop in women. The following factors may also make you more likely to develop MDD:  A family history of depression.  Troubled family relationships.  Abnormally low levels  of certain brain chemicals.  Traumatic events in childhood, especially abuse or the loss of a parent.  Being under a lot of stress, or long-term stress, especially from upsetting life experiences or losses.  A history of:  Chronic physical illness.  Other mental health disorders.  Substance abuse.  Poor living conditions.  Experiencing social exclusion or  discrimination on a regular basis. What are the signs or symptoms? The main symptoms of MDD typically include:  Constant depressed or irritable mood.  Loss of interest in things and activities. MDD symptoms may also include:  Sleeping or eating too much or too little.  Unexplained weight change.  Fatigue or low energy.  Feelings of worthlessness or guilt.  Difficulty thinking clearly or making decisions.  Thoughts of suicide or of harming others.  Physical agitation or weakness.  Isolation. Severe cases of MDD may also occur with other symptoms, such as:  Delusions or hallucinations, in which you imagine things that are not real (psychotic depression).  Low-level depression that lasts at least a year (chronic depression or persistent depressive disorder).  Extreme sadness and hopelessness (melancholic depression).  Trouble speaking and moving (catatonic depression). How is this diagnosed? This condition may be diagnosed based on:  Your symptoms.  Your medical history, including your mental health history. This may involve tests to evaluate your mental health. You may be asked questions about your lifestyle, including any drug and alcohol use, and how long you have had symptoms of MDD.  A physical exam.  Blood tests to rule out other conditions. You must have a depressed mood and at least four other MDD symptoms most of the day, nearly every day in the same 2-week timeframe before your health care provider can confirm a diagnosis of MDD. How is this treated? This condition is usually treated by mental  health professionals, such as psychologists, psychiatrists, and clinical social workers. You may need more than one type of treatment. Treatment may include:  Psychotherapy. This is also called talk therapy or counseling. Types of psychotherapy include:  Cognitive behavioral therapy (CBT). This type of therapy teaches you to recognize unhealthy feelings, thoughts, and behaviors, and replace them with positive thoughts and actions.  Interpersonal therapy (IPT). This helps you to improve the way you relate to and communicate with others.  Family therapy. This treatment includes members of your family.  Medicine to treat anxiety and depression, or to help you control certain emotions and behaviors.  Lifestyle changes, such as:  Limiting alcohol and drug use.  Exercising regularly.  Getting plenty of sleep.  Making healthy eating choices.  Spending more time outdoors. Treatments involving stimulation of the brain can be used in situations with extremely severe symptoms, or when medicine or other therapies do not work over time. These treatments include electroconvulsive therapy, transcranial magnetic stimulation, and vagal nerve stimulation. Follow these instructions at home: Activity   Return to your normal activities as told by your health care provider.  Exercise regularly and spend time outdoors as told by your health care provider. General instructions   Take over-the-counter and prescription medicines only as told by your health care provider.  Do not drink alcohol. If you drink alcohol, limit your alcohol intake to no more than 1 drink a day for nonpregnant women and 2 drinks a day for men. One drink equals 12 oz of beer, 5 oz of wine, or 1 oz of hard liquor. Alcohol can affect any antidepressant medicines you are taking. Talk to your health care provider about your alcohol use.  Eat a healthy diet and get plenty of sleep.  Find activities that you enjoy doing, and make time  to do them.  Consider joining a support group. Your health care provider may be able to recommend a support group.  Keep all follow-up visits as told by your health care provider. This  is important. Where to find more information: [employer] on Mental Illness  www.nami.org [place]. National Institute of Mental Health  https://carter.com/ National Suicide Prevention Lifeline  1-800-273-TALK 601-464-5317). This is free, 24-hour help. Contact a health care provider if:  Your symptoms get worse.  You develop new symptoms. Get help right away if:  You self-harm.  You have serious thoughts about hurting yourself or others.  You see, hear, taste, smell, or feel things that are not present (hallucinate). This information is not intended to replace advice given to you by your health care provider. Make sure you discuss any questions you have with your health care provider. Document Released: 03/08/2013 Document Revised: 07/18/2016 Document Reviewed: 05/22/2016 Elsevier Interactive Patient Education  2017 [place] and Stress Management Stress is a normal reaction to life events. It is what you feel when life demands more than you are used to or more than you can handle. Some stress can be useful. For example, the stress reaction can help you catch the last bus of the day, study for a test, or meet a deadline at work. But stress that occurs too often or for too long can cause problems. It can affect your emotional health and interfere with relationships and normal daily activities. Too much stress can weaken your immune system and increase your risk for physical illness. If you already have a medical problem, stress can make it worse. What are the causes? All sorts of life events may cause stress. An event that causes stress for one person may not be stressful for another person. Major life events commonly cause stress. These may be positive or negative. Examples include losing your  job, moving into a new home, getting married, having a baby, or losing a loved one. Less obvious life events may also cause stress, especially if they occur day after day or in combination. Examples include working long hours, driving in traffic, caring for children, being in debt, or being in a difficult relationship. What are the signs or symptoms? Stress may cause emotional symptoms including, the following:  Anxiety. This is feeling worried, afraid, on edge, overwhelmed, or out of control.  Anger. This is feeling irritated or impatient.  Depression. This is feeling sad, down, helpless, or guilty.  Difficulty focusing, remembering, or making decisions. Stress may cause physical symptoms, including the following:  Aches and pains. These may affect your head, neck, back, stomach, or other areas of your body.  Tight muscles or clenched jaw.  Low energy or trouble sleeping. Stress may cause unhealthy behaviors, including the following:  Eating to feel better (overeating) or skipping meals.  Sleeping too little, too much, or both.  Working too much or putting off tasks (procrastination).  Smoking, drinking alcohol, or using drugs to feel better. How is this diagnosed? Stress is diagnosed through an assessment by your health care provider. Your health care provider will ask questions about your symptoms and any stressful life events.Your health care provider will also ask about your medical history and may order blood tests or other tests. Certain medical conditions and medicine can cause physical symptoms similar to stress. Mental illness can cause emotional symptoms and unhealthy behaviors similar to stress. Your health care provider may refer you to a mental health professional for further evaluation. How is this treated? Stress management is the recommended treatment for stress.The goals of stress management are reducing stressful life events and coping with stress in healthy  ways. Techniques for reducing stressful life  events include the following:  Stress identification. Self-monitor for stress and identify what causes stress for you. These skills may help you to avoid some stressful events.  Time management. Set your priorities, keep a calendar of events, and learn to say "no." These tools can help you avoid making too many commitments. Techniques for coping with stress include the following:  Rethinking the problem. Try to think realistically about stressful events rather than ignoring them or overreacting. Try to find the positives in a stressful situation rather than focusing on the negatives.  Exercise. Physical exercise can release both physical and emotional tension. The key is to find a form of exercise you enjoy and do it regularly.  Relaxation techniques. These relax the body and mind. Examples include yoga, meditation, tai chi, biofeedback, deep breathing, progressive muscle relaxation, listening to music, being out in nature, journaling, and other hobbies. Again, the key is to find one or more that you enjoy and can do regularly.  Healthy lifestyle. Eat a balanced diet, get plenty of sleep, and do not smoke. Avoid using alcohol or drugs to relax.  Strong support network. Spend time with family, friends, or other people you enjoy being around.Express your feelings and talk things over with someone you trust. Counseling or talktherapy with a mental health professional may be helpful if you are having difficulty managing stress on your own. Medicine is typically not recommended for the treatment of stress.Talk to your health care provider if you think you need medicine for symptoms of stress. Follow these instructions at home:  Keep all follow-up visits as directed by your health care provider.  Take all medicines as directed by your health care provider. Contact a health care provider if:  Your symptoms get worse or you start having new  symptoms.  You feel overwhelmed by your problems and can no longer manage them on your own. Get help right away if:  You feel like hurting yourself or someone else. This information is not intended to replace advice given to you by your health care provider. Make sure you discuss any questions you have with your health care provider. Document Released: 05/07/2001 Document Revised: 04/18/2016 Document Reviewed: 07/06/2013 Elsevier Interactive Patient Education  2017 [employer].   IF you received an x-ray today, you will receive an invoice from [facility]. Please contact [facility] at [phone] with questions or concerns regarding your invoice.   IF you received labwork today, you will receive an invoice from [place]. Please contact [facility] at [phone] with questions or concerns regarding your invoice.   Our billing staff will not be able to assist you with questions regarding bills from these companies.  You will be contacted with the lab results as soon as they are available. The fastest way to get your results is to activate your My Chart account. Instructions are located on the last page of this paperwork. If you have not heard from [place] regarding the results in 2 weeks, please contact this office.

## 2017-04-13 LAB — HEMOGLOBIN A1C
ESTIMATED AVERAGE GLUCOSE: 171 mg/dL
Hgb A1c MFr Bld: 7.6 % — ABNORMAL HIGH (ref 4.8–5.6)

## 2017-04-22 ENCOUNTER — Encounter: Payer: Self-pay | Admitting: Family Medicine

## 2017-04-25 ENCOUNTER — Ambulatory Visit
Admission: RE | Admit: 2017-04-25 | Discharge: 2017-04-25 | Disposition: A | Discharge status: Home / Self Care | Payer: Managed Care, Other (non HMO) | Source: Ambulatory Visit | Attending: Nephrology | Admitting: Nephrology

## 2017-04-25 DIAGNOSIS — N183 Chronic kidney disease, stage 3 unspecified: Secondary | ICD-10-CM

## 2017-04-25 LAB — HM DIABETES EYE EXAM

## 2017-05-26 ENCOUNTER — Telehealth: Payer: Self-pay | Admitting: Diagnostic Neuroimaging

## 2017-05-26 NOTE — Telephone Encounter (Signed)
Message   Appointment Request From: Lowella Dell    With Provider: Joycelyn Schmid, MD [[facility]]    Preferred Date Range: From 07/07/2017 To 07/14/2017    Preferred Times: Any    Reason for visit: Office Visit    Comments:  Requesting a follow up visit in order to discuss Dr. Paralee Cancel comments and plan forward with treating MG

## 2017-05-27 NOTE — Telephone Encounter (Signed)
Spoke to pt and made appt 06-02-17 at 1430, unfortunately I made error and that slot is not available.  I tried to call pt right back and VM not set up could not LM and he did not pick up.  I called again.   Remade appt for 07/16/17 at 1430.  He was ok with this.

## 2017-06-04 NOTE — Progress Notes (Unsigned)
[facility] Diabetic Eye Examination Report Provider: Wynell Balloon, M.D. Return for re-evaluation in 12 months  Cataracts OU

## 2017-06-22 ENCOUNTER — Encounter: Payer: Self-pay | Admitting: Adult Health

## 2017-07-03 ENCOUNTER — Other Ambulatory Visit: Payer: Self-pay | Admitting: Family Medicine

## 2017-07-03 DIAGNOSIS — E1122 Type 2 diabetes mellitus with diabetic chronic kidney disease: Secondary | ICD-10-CM

## 2017-07-03 DIAGNOSIS — E785 Hyperlipidemia, unspecified: Secondary | ICD-10-CM

## 2017-07-03 DIAGNOSIS — I10 Essential (primary) hypertension: Secondary | ICD-10-CM

## 2017-07-16 ENCOUNTER — Ambulatory Visit (INDEPENDENT_AMBULATORY_CARE_PROVIDER_SITE_OTHER): Payer: 59 | Admitting: Diagnostic Neuroimaging

## 2017-07-16 ENCOUNTER — Encounter: Payer: Self-pay | Admitting: Diagnostic Neuroimaging

## 2017-07-16 VITALS — BP 136/74 | HR 57 | Wt 206.6 lb

## 2017-07-16 DIAGNOSIS — H532 Diplopia: Secondary | ICD-10-CM | POA: Diagnosis not present

## 2017-07-16 DIAGNOSIS — G7000 Myasthenia gravis without (acute) exacerbation: Secondary | ICD-10-CM | POA: Diagnosis not present

## 2017-07-16 DIAGNOSIS — R471 Dysarthria and anarthria: Secondary | ICD-10-CM

## 2017-07-16 MED ORDER — PYRIDOSTIGMINE BROMIDE 60 MG PO TABS: 60.0000 mg | ORAL_TABLET | Freq: Two times a day (BID) | ORAL | 4 refills | Status: DC

## 2017-07-16 NOTE — Progress Notes (Signed)
[facility]  PATIENT: Jonathan Mcgee DOB: 1950-12-06  REFERRING CLINICIAN: Deliah Boston, PA-c HISTORY FROM: patient  REASON FOR VISIT: follow up     HISTORICAL  CHIEF COMPLAINT:  Chief Complaint  Patient presents with  . Myasthenia Gravis    rm 7, "I''ve noticed a little more muscle fatigue; no sig improvement in stamina"  . Follow-up    6 month    HISTORY OF PRESENT ILLNESS:   UPDATE 07/16/17: Since last visit, overall stable. Mild generalized fatigue continues. Overall stable. Notices some more muscle strain.   UPDATE 02/12/17: Since last visit, BP is better (meds have been adjusted). Now has CPAP machine (about to start). Sugars are better now (averaging ~150's). Intermittent double vision continues. Mild generalized fatigue, but no focal weakness.   UPDATE 12/31/16: Since last visit, IVIG completed, and general energy little better, but muscle cramps worse. BP running high. He has not checked sugars lately. PSG showed mod-severe OSA, and CPAP titration study is pending. Tolerating mestinon 60mg  BID.   UPDATE 10/09/16: Since last visit, double vision is slightly better on mestinon 60mg  TID. However having more muscle cramps, excess saliva production. Fatigable weakness is persistent.   PRIOR HPI (08/28/16): 66 year old ambidextrous male with hypertension, diabetes, hypercholesterolemia here for evaluation of myasthenia gravis. For past 1-2 years patient has had mild intermittent right-sided ptosis. In May 2017 he noticed onset of double vision, blurred vision, wavy vision. This is progressively worsened. For past 1 month he has had increasing fatigue with walking. Is having difficulty with upper and lower extremity strength. He denies any speech, swallowing, breathing difficulty. His balance has been slightly off. Patient went to PCP, ophthalmology, had [facility] antibody testing which confirmed diagnosis of myasthenia gravis.   REVIEW OF SYSTEMS: Full 14 system  review of systems performed and negative with exception of: only as per HPI.   ALLERGIES: Allergies  Allergen Reactions  . Lisinopril Swelling    Facial swelling    HOME MEDICATIONS: Outpatient Medications Prior to Visit  Medication Sig Dispense Refill  . amLODipine (NORVASC) 5 MG tablet Take 1 tablet (5 mg total) by mouth daily. 90 tablet 0  . atorvastatin (LIPITOR) 20 MG tablet Take 1 tablet (20 mg total) by mouth daily. 90 tablet 1  . losartan-hydrochlorothiazide (HYZAAR) 100-25 MG tablet Take 1 tablet by mouth daily. 90 tablet 1  . metFORMIN (GLUCOPHAGE) 1000 MG tablet Take 1 tablet (1,000 mg total) by mouth 2 (two) times daily with a meal. 180 tablet 1  . pyridostigmine (MESTINON) 60 MG tablet Take 1 tablet (60 mg total) by mouth 2 (two) times daily. 180 tablet 4   No facility-administered medications prior to visit.     PAST MEDICAL HISTORY: Past Medical History:  Diagnosis Date  . Arthritis   . Diabetes mellitus without complication (HCC)   . Hypercholesterolemia   . Hypertension   . Myasthenia gravis (HCC)     PAST SURGICAL HISTORY: Past Surgical History:  Procedure Laterality Date  . KNEE SURGERY Right    fractured patella > 20 yrs ago  . PENILE PROSTHESIS IMPLANT  2015    FAMILY HISTORY: Family History  Problem Relation Age of Onset  . Diabetes Mother   . Heart disease Mother   . Hyperlipidemia Mother   . Heart attack Mother   . Hyperlipidemia Father   . Kidney failure Father   . Drug abuse Maternal Grandmother   . Heart disease Maternal Grandmother   . Hypertension Maternal Grandmother   .  Hyperlipidemia Maternal Grandmother   . Diabetes Maternal Grandmother     SOCIAL HISTORY:  Social History   Social History  . Marital status: Single    Spouse name: N/A  . Number of children: 1  . Years of education: 66   Occupational History  .      [employer]   Social History Main Topics  . Smoking status: Current Some Day Smoker    Packs/day: 1.00     Types: Cigars  . Smokeless tobacco: Never Used  . Alcohol use Yes     Comment: occas  . Drug use: No  . Sexual activity: Not on file   Other Topics Concern  . Not on file   Social History Narrative   Drinks 2-3 caffeine drinks a day      PHYSICAL EXAM  GENERAL EXAM/CONSTITUTIONAL: Vitals:  Vitals:   07/16/17 1407  BP: 136/74  Pulse: (!) 57  Weight: 206 lb 9.6 oz (93.7 kg)   Body mass index is 29.23 kg/m. No exam data present  Patient is in no distress; well developed, nourished and groomed; neck is supple  CARDIOVASCULAR:  Examination of carotid arteries is normal; no carotid bruits  Regular rate and rhythm, no murmurs  Examination of peripheral vascular system by observation and palpation is normal  EYES:  Ophthalmoscopic exam of optic discs and posterior segments is normal; no papilledema or hemorrhages  MUSCULOSKELETAL:  Gait, strength, tone, movements noted in Neurologic exam below  NEUROLOGIC: MENTAL STATUS:  No flowsheet data found.  awake, alert, oriented to person, place and time  recent and remote memory intact  normal attention and concentration  language fluent, comprehension intact, naming intact,   fund of knowledge appropriate  CRANIAL NERVE:   2nd - no papilledema on fundoscopic exam  2nd, 3rd, 4th, 6th - pupils equal and reactive to light, visual fields full to confrontation, extraocular muscles --> NO DOUBLE VISION; NORMAL EYE MOVEMENTS; MILD RIGHT PTOSIS; no nystagmus  5th - facial sensation symmetric  7th - facial strength symmetric  8th - hearing intact  9th - palate elevates symmetrically, uvula midline  11th - shoulder shrug symmetric  12th - tongue protrusion midline  MOTOR:   normal bulk and tone, full strength in the BUE, BLE  SENSORY:   normal and symmetric to light touch, temperature, vibration  COORDINATION:   finger-nose-finger, fine finger movements normal  REFLEXES:   deep tendon reflexes TRACE  and symmetric; ABSENT AT KNEES AND ANKLES  GAIT/STATION:   narrow based gait    DIAGNOSTIC DATA (LABS, IMAGING, TESTING) - I reviewed patient records, labs, notes, testing and imaging myself where available.  Lab Results  Component Value Date   WBC 7.6 12/31/2016   HGB 12.4 (L) 12/31/2016   HCT 36.3 (L) 12/31/2016   MCV 96 12/31/2016   PLT 231 12/31/2016      Component Value Date/Time   NA 144 01/13/2017 1643   K 4.5 01/13/2017 1643   CL 105 01/13/2017 1643   CO2 21 01/13/2017 1643   GLUCOSE 148 (H) 01/13/2017 1643   GLUCOSE 115 (H) 06/29/2016 1626   BUN 18 01/13/2017 1643   CREATININE 1.46 (H) 01/13/2017 1643   CREATININE 1.39 (H) 06/29/2016 1626   CALCIUM 9.1 01/13/2017 1643   PROT 7.4 12/31/2016 1508   ALBUMIN 4.3 12/31/2016 1508   AST 14 12/31/2016 1508   ALT 12 12/31/2016 1508   ALKPHOS 93 12/31/2016 1508   BILITOT 0.2 12/31/2016 1508   GFRNONAA 49 (L)  01/13/2017 1643   GFRNONAA 53 (L) 06/29/2016 1626   GFRAA 57 (L) 01/13/2017 1643   GFRAA 61 06/29/2016 1626   Lab Results  Component Value Date   CHOL 133 01/13/2017   HDL 34 (L) 01/13/2017   LDLCALC 60 01/13/2017   TRIG 197 (H) 01/13/2017   CHOLHDL 3.9 01/13/2017   Lab Results  Component Value Date   HGBA1C 7.6 (H) 04/12/2017   No results found for: VITAMINB12 Lab Results  Component Value Date   TSH 3.39 06/29/2016    07/04/16 CT head [I reviewed images myself and agree with interpretation. -[facility]]  - Mild microvascular disease without acute intracranial process.  09/04/16 CT chest  1. No mediastinal mass or adenopathy. 2. No infiltrate or pulmonary edema. 3. Degenerative changes thoracic spine. 4. Atherosclerotic calcifications of thoracic aorta. 5. Mild fatty infiltration of the liver.     ASSESSMENT AND PLAN  67 y.o. year old male here with new onset diagnosis of myasthenia gravis with ocular and generalized features. Was having side effects from mestinon, but also some improvement in  ocular symptoms. IVIG has helped generalized symptoms.    Dx: (ocular >> generalized) myasthenia gravis  1. Myasthenia gravis (HCC)   2. Dysarthria   3. Double vision      PLAN:  I spent 25 minutes of face to face time with patient. Greater than 50% of time was spent in counseling and coordination of care with patient. In summary we discussed:   - continue pyridostigmine 60mg  twice a day  - monitor for generalized myasthenia gravis symptoms; patient is slightly better now, so will hold off on prednisone for now  - in future may consider prednisone 60mg  daily for longer term treatment of myasthenia gravis; also would then consider prednisone taper +/- steroid sparing therapy (imuran, cellcept or soliris)  - continue BP and DM control  - continue CPAP usage  Meds ordered this encounter  Medications  . DISCONTD: pyridostigmine (MESTINON) 60 MG tablet    Sig: Take 1 tablet (60 mg total) by mouth 2 (two) times daily.    Dispense:  180 tablet    Refill:  4  . pyridostigmine (MESTINON) 60 MG tablet    Sig: Take 1 tablet (60 mg total) by mouth 2 (two) times daily.    Dispense:  180 tablet    Refill:  4   Return in about 9 months (around 04/15/2018).     , MD 07/16/2017, 2:23 PM Certified in Neurology, Neurophysiology and Neuroimaging  [facility] [address] [place], [address] [place] [phone]

## 2017-07-17 ENCOUNTER — Encounter: Payer: Self-pay | Admitting: Family Medicine

## 2017-07-17 ENCOUNTER — Ambulatory Visit (INDEPENDENT_AMBULATORY_CARE_PROVIDER_SITE_OTHER): Payer: 59 | Admitting: Family Medicine

## 2017-07-17 DIAGNOSIS — E785 Hyperlipidemia, unspecified: Secondary | ICD-10-CM

## 2017-07-17 DIAGNOSIS — E1122 Type 2 diabetes mellitus with diabetic chronic kidney disease: Secondary | ICD-10-CM | POA: Diagnosis not present

## 2017-07-17 DIAGNOSIS — I10 Essential (primary) hypertension: Secondary | ICD-10-CM

## 2017-07-17 MED ORDER — AMLODIPINE BESYLATE 5 MG PO TABS: 7.5000 mg | ORAL_TABLET | Freq: Every day | ORAL | 1 refills | Status: DC

## 2017-07-17 MED ORDER — LOSARTAN POTASSIUM-HCTZ 100-25 MG PO TABS: 1.0000 | ORAL_TABLET | Freq: Every day | ORAL | 1 refills | Status: DC

## 2017-07-17 MED ORDER — METFORMIN HCL 1000 MG PO TABS: 1000.0000 mg | ORAL_TABLET | Freq: Two times a day (BID) | ORAL | 1 refills | Status: DC

## 2017-07-17 MED ORDER — ATORVASTATIN CALCIUM 20 MG PO TABS: 20.0000 mg | ORAL_TABLET | Freq: Every day | ORAL | 1 refills | Status: DC

## 2017-07-17 NOTE — Patient Instructions (Addendum)
  Increase amlodipine to 7.5 mg per day. Take 1 and 1/2 pills per day of your amlodipine for this dose. Goal blood pressure of under 130/80. Continue losartan HCTZ same dose. Return tomorrow for fasting blood work.  No other medication changes for now, will let you know if anything is needed based on your blood work.  Follow-up in 3 months, and again, happy birthday!    IF you received an x-ray today, you will receive an invoice from [facility]. Please contact [facility] at [phone] with questions or concerns regarding your invoice.   IF you received labwork today, you will receive an invoice from [place]. Please contact [facility] at [phone] with questions or concerns regarding your invoice.   Our billing staff will not be able to assist you with questions regarding bills from these companies.  You will be contacted with the lab results as soon as they are available. The fastest way to get your results is to activate your My Chart account. Instructions are located on the last page of this paperwork. If you have not heard from [place] regarding the results in 2 weeks, please contact this office.

## 2017-07-17 NOTE — Progress Notes (Signed)
Subjective:  By signing my name below, I, Stann Ore, attest that this documentation has been prepared under the direction and in the presence of [name], MD. Electronically Signed: Stann Ore, Scribe. 07/17/2017 , 12:04 PM .  Patient was seen in Room 27 .   Patient ID: Jonathan Mcgee, male    DOB: November 29, 1950, 67 y.o.   MRN: [id number] Chief Complaint  Patient presents with  . Follow-up    DM check  . Medication Refill    atorvastatin, losartan, metformin, amlodipine   HPI Jonathan Mcgee is a 66 y.o. male Here for follow up. He had coffee with breakfast around 8:00AM this morning. He moved into a new house on Aug 1st.   Diabetes Lab Results  Component Value Date   HGBA1C 7.6 (H) 04/12/2017   His last micro albumin was 13.8, done on Feb 2018.   He was given option of adding [place] or Januvia were discussed after last visit, but he planned on better nutrition and exercise, so no medication changes were performed. He was continued on metformin 1000mg  BID.   He notes his sugar has been in the 130s, and as low as 102. His reading this morning was in 250s after having some pie. His home sugar readings show consistently lower than before since last visit: low of 108 and high of 253, primarily low 100s. He denies missing any doses of his metformin.   Vision: Last saw optho, Dr. , on June 4th. He has diplopia with myasthenia gravis; mild AV changes with hypertensive retinopathy; no diabetic retinopathy.   Dentist: He isn't followed by dentist.   HTN Lab Results  Component Value Date   CREATININE 1.46 (H) 01/13/2017   Possible chronic kidney disease, but overall stable. He was referred to Dr. 01/15/2017 at [facility]. He takes Norvasc 5mg  QD and Losartan-HCTZ 100-25mg  QD.   Patient states he wasn't happy with his referral to [facility], as he notes it took almost over a month for results of his kidney ultrasound, and didn't receive any  further clarity.   His home BP readings from June-July, low 130/71, and high 133/76. He denies missing any doses of his BP medications.   Hyperlipidemia Lab Results  Component Value Date   CHOL 133 01/13/2017   HDL 34 (L) 01/13/2017   LDLCALC 60 01/13/2017   TRIG 197 (H) 01/13/2017   CHOLHDL 3.9 01/13/2017   He's taking Lipitor 20mg  QD.   Myasthenia gravis He's on Mestinon 60mg  BID. He is followed by Dr. 01/15/2017; last seen yesterday and notes his numbers are down. He was informed that if his numbers (BP and sugar numbers) continue to improve, he won't need prednisone. He was dealing with eye sight issues, double vision and some additional weakness with walking and stamina. He mentions having some aches and pains.   Adjustment disorder/reactive depression See last visit in May. He denied suicidal intent or plan, but had fleeting thoughts of not having to suffer current illnesses. We discussed SSRI but he declined.   He became unemployed about 2 days ago, and other personal problems recently. He states, "I'm going to put on my big-boy pants, and just suck it up." He denies SI/HI.   Patient Active Problem List   Diagnosis Date Noted  . CKD (chronic kidney disease) 04/12/2017  . Essential hypertension 01/13/2017  . Hyperlipidemia 01/13/2017  . Myasthenia gravis (HCC) 08/28/2016   Past Medical History:  Diagnosis Date  . Arthritis   . Diabetes  mellitus without complication (HCC)   . Hypercholesterolemia   . Hypertension   . Myasthenia gravis [facility])    Past Surgical History:  Procedure Laterality Date  . KNEE SURGERY Right    fractured patella > 20 yrs ago  . PENILE PROSTHESIS IMPLANT  2015   Allergies  Allergen Reactions  . Lisinopril Swelling    Facial swelling   Prior to Admission medications   Medication Sig Start Date End Date Taking? Authorizing Provider  amLODipine (NORVASC) 5 MG tablet Take 1 tablet (5 mg total) by mouth daily. 04/12/17  Yes Shade Flood, MD    atorvastatin (LIPITOR) 20 MG tablet Take 1 tablet (20 mg total) by mouth daily. 01/13/17  Yes Shade Flood, MD  losartan-hydrochlorothiazide (HYZAAR) 100-25 MG tablet Take 1 tablet by mouth daily. 01/13/17  Yes Shade Flood, MD  metFORMIN (GLUCOPHAGE) 1000 MG tablet Take 1 tablet (1,000 mg total) by mouth 2 (two) times daily with a meal. 01/13/17  Yes Shade Flood, MD  pyridostigmine (MESTINON) 60 MG tablet Take 1 tablet (60 mg total) by mouth 2 (two) times daily. 07/16/17  Yes Penumalli, Glenford Bayley, MD   Social History   Social History  . Marital status: Single    Spouse name: N/A  . Number of children: 1  . Years of education: 70   Occupational History  .      [employer]   Social History Main Topics  . Smoking status: Current Some Day Smoker    Packs/day: 1.00    Types: Cigars  . Smokeless tobacco: Never Used  . Alcohol use Yes     Comment: occas  . Drug use: No  . Sexual activity: Not on file   Other Topics Concern  . Not on file   Social History Narrative   Drinks 2-3 caffeine drinks a day    Review of Systems  Constitutional: Negative for fatigue and unexpected weight change.  Eyes: Negative for visual disturbance.  Respiratory: Negative for cough, chest tightness and shortness of breath.   Cardiovascular: Negative for chest pain, palpitations and leg swelling.  Gastrointestinal: Negative for abdominal pain and blood in stool.  Neurological: Negative for dizziness, light-headedness and headaches.       Objective:   Physical Exam  Constitutional: He is oriented to person, place, and time. He appears well-developed and well-nourished.  HENT:  Head: Normocephalic and atraumatic.  Eyes: Pupils are equal, round, and reactive to light. EOM are normal.  Neck: No JVD present. Carotid bruit is not present.  Cardiovascular: Normal rate, regular rhythm and normal heart sounds.   No murmur heard. Pulmonary/Chest: Effort normal and breath sounds normal. He has no rales.   Musculoskeletal: He exhibits no edema.  Neurological: He is alert and oriented to person, place, and time.  Skin: Skin is warm and dry.  Psychiatric: He has a normal mood and affect.  Vitals reviewed.   Vitals:   07/17/17 1105  BP: 130/66  Pulse: 69  Resp: 16  Temp: 99 F (37.2 C)  TempSrc: Oral  SpO2: 97%  Weight: 203 lb (92.1 kg)  Height: 5\' 9"  (1.753 m)      Assessment & Plan:   Jonathan Mcgee is a 66 y.o. male Essential hypertension - Plan: amLODipine (NORVASC) 5 MG tablet, losartan-hydrochlorothiazide (HYZAAR) 100-25 MG tablet, Comprehensive metabolic panel  - Improved readings at home, but with chronic kidney disease and signs of hypertensive retinopathy at that visit, we'll strive for improved control, goal less than  130/80.  -continue losartan HCT at same dose, increase amlodipine to 7.5 mg daily. Monitor readings.  - Labs pending  Hyperlipidemia, unspecified hyperlipidemia type - Plan: atorvastatin (LIPITOR) 20 MG tablet, Comprehensive metabolic panel, Lipid panel  - Tolerating Lipitor, continue same dose, labs pending, plans for return for fasting lab visit tomorrow.  Type 2 diabetes mellitus with chronic kidney disease, without long-term current use of insulin, unspecified CKD stage (HCC) - Plan: metFORMIN (GLUCOPHAGE) 1000 MG tablet, Hemoglobin A1c  - Improving home readings, discussed goal of less than 7.0 for A1c. Continue metformin for now, but may consider additional agent, especially if he plans on taking prednisone for myasthenia gravis.   Recheck in 3 months    Meds ordered this encounter  Medications  . amLODipine (NORVASC) 5 MG tablet    Sig: Take 1.5 tablets (7.5 mg total) by mouth daily.    Dispense:  135 tablet    Refill:  1  . atorvastatin (LIPITOR) 20 MG tablet    Sig: Take 1 tablet (20 mg total) by mouth daily.    Dispense:  90 tablet    Refill:  1  . losartan-hydrochlorothiazide (HYZAAR) 100-25 MG tablet    Sig: Take 1 tablet by mouth  daily.    Dispense:  90 tablet    Refill:  1  . metFORMIN (GLUCOPHAGE) 1000 MG tablet    Sig: Take 1 tablet (1,000 mg total) by mouth 2 (two) times daily with a meal.    Dispense:  180 tablet    Refill:  1   Patient Instructions    Increase amlodipine to 7.5 mg per day. Take 1 and 1/2 pills per day of your amlodipine for this dose. Goal blood pressure of under 130/80. Continue losartan HCTZ same dose. Return tomorrow for fasting blood work.  No other medication changes for now, will let you know if anything is needed based on your blood work.  Follow-up in 3 months, and again, happy birthday!    IF you received an x-ray today, you will receive an invoice from [facility]. Please contact [facility] at [phone] with questions or concerns regarding your invoice.   IF you received labwork today, you will receive an invoice from [place]. Please contact [facility] at [phone] with questions or concerns regarding your invoice.   Our billing staff will not be able to assist you with questions regarding bills from these companies.  You will be contacted with the lab results as soon as they are available. The fastest way to get your results is to activate your My Chart account. Instructions are located on the last page of this paperwork. If you have not heard from [place] regarding the results in 2 weeks, please contact this office.       I personally performed the services described in this documentation, which was scribed in my presence. The recorded information has been reviewed and considered for accuracy and completeness, addended by me as needed, and agree with information above.  Signed,   [name], MD Primary Care at [facility].  07/17/17 1:27 PM

## 2017-07-19 ENCOUNTER — Ambulatory Visit (INDEPENDENT_AMBULATORY_CARE_PROVIDER_SITE_OTHER): Payer: 59 | Admitting: Family Medicine

## 2017-07-19 DIAGNOSIS — E1122 Type 2 diabetes mellitus with diabetic chronic kidney disease: Secondary | ICD-10-CM

## 2017-07-19 DIAGNOSIS — E785 Hyperlipidemia, unspecified: Secondary | ICD-10-CM

## 2017-07-19 DIAGNOSIS — I10 Essential (primary) hypertension: Secondary | ICD-10-CM

## 2017-07-19 NOTE — Progress Notes (Signed)
Lab visit

## 2017-07-20 LAB — COMPREHENSIVE METABOLIC PANEL
ALT: 11 IU/L (ref 0–44)
AST: 16 IU/L (ref 0–40)
Albumin/Globulin Ratio: 1.8 (ref 1.2–2.2)
Albumin: 4.5 g/dL (ref 3.6–4.8)
Alkaline Phosphatase: 81 IU/L (ref 39–117)
BUN/Creatinine Ratio: 9 — ABNORMAL LOW (ref 10–24)
BUN: 13 mg/dL (ref 8–27)
Bilirubin Total: 0.3 mg/dL (ref 0.0–1.2)
CALCIUM: 9.2 mg/dL (ref 8.6–10.2)
CO2: 21 mmol/L (ref 20–29)
CREATININE: 1.37 mg/dL — AB (ref 0.76–1.27)
Chloride: 105 mmol/L (ref 96–106)
GFR calc Af Amer: 61 mL/min/{1.73_m2} (ref >59)
GFR, EST NON AFRICAN AMERICAN: 53 mL/min/{1.73_m2} — AB (ref >59)
Globulin, Total: 2.5 g/dL (ref 1.5–4.5)
Glucose: 106 mg/dL — ABNORMAL HIGH (ref 65–99)
Potassium: 4.5 mmol/L (ref 3.5–5.2)
Sodium: 141 mmol/L (ref 134–144)
Total Protein: 7 g/dL (ref 6.0–8.5)

## 2017-07-20 LAB — LIPID PANEL
Chol/HDL Ratio: 2.7 ratio (ref 0.0–5.0)
Cholesterol, Total: 106 mg/dL (ref 100–199)
HDL: 39 mg/dL — ABNORMAL LOW (ref >39)
LDL Calculated: 57 mg/dL (ref 0–99)
Triglycerides: 51 mg/dL (ref 0–149)
VLDL Cholesterol Cal: 10 mg/dL (ref 5–40)

## 2017-07-20 LAB — HEMOGLOBIN A1C
ESTIMATED AVERAGE GLUCOSE: 140 mg/dL
Hgb A1c MFr Bld: 6.5 % — ABNORMAL HIGH (ref 4.8–5.6)

## 2017-07-23 ENCOUNTER — Ambulatory Visit (INDEPENDENT_AMBULATORY_CARE_PROVIDER_SITE_OTHER): Payer: 59 | Admitting: Adult Health

## 2017-07-23 ENCOUNTER — Encounter: Payer: Self-pay | Admitting: Adult Health

## 2017-07-23 VITALS — BP 151/75 | HR 82 | Ht 69.0 in | Wt 207.8 lb

## 2017-07-23 DIAGNOSIS — Z9989 Dependence on other enabling machines and devices: Secondary | ICD-10-CM | POA: Diagnosis not present

## 2017-07-23 DIAGNOSIS — G4733 Obstructive sleep apnea (adult) (pediatric): Secondary | ICD-10-CM

## 2017-07-23 NOTE — Patient Instructions (Signed)
Your Plan:  Continue using cpap nightly  Mask refitting at [facility] If your symptoms worsen or you develop new symptoms please let us know.    Thank you for coming to see [place] at [facility]. I hope we have been able to provide you high quality care today.  You may receive a patient satisfaction survey over the next few weeks. We would appreciate your feedback and comments so that we may continue to improve ourselves and the health of our patients.

## 2017-07-23 NOTE — Progress Notes (Addendum)
PATIENT: Jonathan Mcgee DOB: 1951-07-02  REASON FOR VISIT: follow up HISTORY FROM: patient  HISTORY OF PRESENT ILLNESS: Today 07/23/17 Jonathan Mcgee is a 66 year old male with a history of obstructive sleep apnea. His download indicates that he uses machine 23 out of 30 days for compliance of 70%. He uses machine greater than 4 hours 17 out of 30 days for compliance 7%. On average he uses his machine 4 hours and 49 minutes. His residual AHI is 1.6 with 11 cm water with EPR 3. His leak in the 95th percentile was 34.3 L/m. The patient states that he needs to switch to a full face mask. He reports that the nasal pillows leak all night long. He states that he has been trying to go to his DME company however their hours and his work hours have not matched. He does plan to go there today.  HISTORY 04/03/17: Jonathan Mcgee is a 66 year old right-handed gentleman with an underlying medical history of diabetes, hypertension, hyperlipidemia, smoking, arthritis, obesity, and recent diagnosis of myasthenia gravis, who presents for follow-up consultation of his obstructive sleep apnea, after sleep study testing. The patient is unaccompanied today. I first met him on 09/17/2016 at the request of Dr. Leta Baptist, at which time he reported a prior diagnosis of obstructive sleep apnea but he was no longer on CPAP therapy for about 3 years. I invited him for sleep study. He had a baseline sleep study, followed by a CPAP titration study. I went over his test results with him in detail today. His baseline sleep study from 11/07/2016 showed a sleep efficiency of 79.8%, sleep latency 47 minutes, REM latency 61.5 minutes. He had an increased percentage of stage I sleep, normal percentage of stage II sleep, slow-wave sleep at 11.6% and REM sleep at 24.6%. He had mild to moderate snoring. EKG showed normal sinus rhythm, overall AHI was 20 per hour, REM AHI was 53.2 per hour, average oxygen saturation 97%, nadir was 71%, time below  89% saturation was 37 minutes, he had mild PLMS with an index of 28.4 per hour, with no significant arousals. Based on his test results in medical history as well as sleep-related complaints I invited him for a full night CPAP titration study. He had this on 01/06/2017. Sleep efficiency was 89.6%, sleep latency 6 minutes, REM latency 137 minutes. He had an increased percentage of stage II sleep, absence of slow-wave sleep and REM sleep was 19.9%. He was fitted with a nasal pillows interface, CPAP was titrated from 5 cm to 11 cm. On the final pressure his AHI was 0 per hour with supine sleep achieved an O2 nadir of 92%. He had no significant PLMS. Average oxygen saturation was 92%, nadir was 73%, time below 89% saturation was 23 minutes for the night. Based on his test results I prescribed CPAP therapy for home use.   04/03/2017  I reviewed his CPAP compliance data from 03/03/2017 through 04/01/2017, which is a total of 30 days, during which time he used his CPAP 23 days with percent used days greater than 4 hours at 67%, indicating suboptimal compliance with an average usage of 5 hours and 10 minutes, residual AHI 3.7 per hour, leak on the high side with the 95th percentile at 34.9 L/m on a pressure of 11 cm with EPR of 3. In the past 57 days his compliance was 51%. He reports doing okay, feels a little better in terms of sleep quality and sleep consolidation, daytime energy a little better.  He travels quite a bit and does not take his machine with him as it is a nuisance.   REVIEW OF SYSTEMS: Out of a complete 14 system review of symptoms, the patient complains only of the following symptoms, and all other reviewed systems are negative.  See HPI    ALLERGIES: Allergies  Allergen Reactions  . Lisinopril Swelling    Facial swelling    HOME MEDICATIONS: Outpatient Medications Prior to Visit  Medication Sig Dispense Refill  . amLODipine (NORVASC) 5 MG tablet Take 1.5 tablets (7.5 mg total) by  mouth daily. 135 tablet 1  . atorvastatin (LIPITOR) 20 MG tablet Take 1 tablet (20 mg total) by mouth daily. 90 tablet 1  . losartan-hydrochlorothiazide (HYZAAR) 100-25 MG tablet Take 1 tablet by mouth daily. 90 tablet 1  . metFORMIN (GLUCOPHAGE) 1000 MG tablet Take 1 tablet (1,000 mg total) by mouth 2 (two) times daily with a meal. 180 tablet 1  . pyridostigmine (MESTINON) 60 MG tablet Take 1 tablet (60 mg total) by mouth 2 (two) times daily. 180 tablet 4   No facility-administered medications prior to visit.     PAST MEDICAL HISTORY: Past Medical History:  Diagnosis Date  . Arthritis   . Diabetes mellitus without complication ([place])   . Hypercholesterolemia   . Hypertension   . Myasthenia gravis ([place])     PAST SURGICAL HISTORY: Past Surgical History:  Procedure Laterality Date  . KNEE SURGERY Right    fractured patella > 20 yrs ago  . PENILE PROSTHESIS IMPLANT  2015    FAMILY HISTORY: Family History  Problem Relation Age of Onset  . Diabetes Mother   . Heart disease Mother   . Hyperlipidemia Mother   . Heart attack Mother   . Hyperlipidemia Father   . Kidney failure Father   . Drug abuse Maternal Grandmother   . Heart disease Maternal Grandmother   . Hypertension Maternal Grandmother   . Hyperlipidemia Maternal Grandmother   . Diabetes Maternal Grandmother     SOCIAL HISTORY: Social History   Social History  . Marital status: Single    Spouse name: N/A  . Number of children: 1  . Years of education: 75   Occupational History  .      [employer]   Social History Main Topics  . Smoking status: Current Some Day Smoker    Packs/day: 1.00    Types: Cigars  . Smokeless tobacco: Never Used  . Alcohol use Yes     Comment: occas  . Drug use: No  . Sexual activity: Not on file   Other Topics Concern  . Not on file   Social History Narrative   Drinks 2-3 caffeine drinks a day       PHYSICAL EXAM  Vitals:   07/23/17 1458  BP: (!) 151/75  Pulse: 82  Weight:  207 lb 12.8 oz (94.3 kg)  Height: 5' 9" (1.753 m)   Body mass index is 30.69 kg/m.  Generalized: Well developed, in no acute distress   Neurological examination  Mentation: Alert oriented to time, place, history taking. Follows all commands speech and language fluent Cranial nerve II-XII: Pupils were equal round reactive to light. Extraocular movements were full, visual field were full on confrontational test. Facial sensation and strength were normal. Uvula tongue midline. Head turning and shoulder shrug  were normal and symmetric. Motor: The motor testing reveals 5 over 5 strength of all 4 extremities. Good symmetric motor tone is noted throughout.  Sensory: Sensory  testing is intact to soft touch on all 4 extremities. No evidence of extinction is noted.  Coordination: Cerebellar testing reveals good finger-nose-finger and heel-to-shin bilaterally.  Gait and station: Gait is normal. Tandem gait is normal. Romberg is negative. No drift is seen.  Reflexes: Deep tendon reflexes are symmetric and normal bilaterally.   DIAGNOSTIC DATA (LABS, IMAGING, TESTING) - I reviewed patient records, labs, notes, testing and imaging myself where available.  Lab Results  Component Value Date   WBC 7.6 12/31/2016   HGB 12.4 (L) 12/31/2016   HCT 36.3 (L) 12/31/2016   MCV 96 12/31/2016   PLT 231 12/31/2016      Component Value Date/Time   NA 141 07/19/2017 1112   K 4.5 07/19/2017 1112   CL 105 07/19/2017 1112   CO2 21 07/19/2017 1112   GLUCOSE 106 (H) 07/19/2017 1112   GLUCOSE 115 (H) 06/29/2016 1626   BUN 13 07/19/2017 1112   CREATININE 1.37 (H) 07/19/2017 1112   CREATININE 1.39 (H) 06/29/2016 1626   CALCIUM 9.2 07/19/2017 1112   PROT 7.0 07/19/2017 1112   ALBUMIN 4.5 07/19/2017 1112   AST 16 07/19/2017 1112   ALT 11 07/19/2017 1112   ALKPHOS 81 07/19/2017 1112   BILITOT 0.3 07/19/2017 1112   GFRNONAA 53 (L) 07/19/2017 1112   GFRNONAA 53 (L) 06/29/2016 1626   GFRAA 61 07/19/2017 1112    GFRAA 61 06/29/2016 1626   Lab Results  Component Value Date   CHOL 106 07/19/2017   HDL 39 (L) 07/19/2017   LDLCALC 57 07/19/2017   TRIG 51 07/19/2017   CHOLHDL 2.7 07/19/2017   Lab Results  Component Value Date   HGBA1C 6.5 (H) 07/19/2017   No results found for: VITAMINB12 Lab Results  Component Value Date   TSH 3.39 06/29/2016      ASSESSMENT AND PLAN 66 y.o. year old male  has a past medical history of Arthritis; Diabetes mellitus without complication ([place]); Hypercholesterolemia; Hypertension; and Myasthenia gravis ([place]). here with:  1. OSA on CPAP  The patient's compliance data indicates a significant leak. He will be going by his DME company today in order was sent for a mask refitting. The patient is encouraged to use his CPAP nightly for greater than 4 hours each night. He is advised that if his symptoms worsen or he develops new symptoms he should let us know. He will follow-up in 6 months or sooner if needed.  I spent 15 minutes with the patient. 50% of this time was spent reviewing his CPAP download      Ward Givens, MSN, NP-C 07/23/2017, 4:32 PM [facility] [address], [place], [place] [address] [phone]  I reviewed the above note and documentation by the Nurse Practitioner and agree with the history, physical exam, assessment and plan as outlined above. I was immediately available for face-to-face consultation. Star Age, MD, PhD [facility])

## 2017-08-13 ENCOUNTER — Encounter: Payer: Self-pay | Admitting: Family Medicine

## 2017-08-13 DIAGNOSIS — I10 Essential (primary) hypertension: Secondary | ICD-10-CM

## 2017-08-13 DIAGNOSIS — E1122 Type 2 diabetes mellitus with diabetic chronic kidney disease: Secondary | ICD-10-CM

## 2017-08-13 DIAGNOSIS — E785 Hyperlipidemia, unspecified: Secondary | ICD-10-CM

## 2017-08-20 MED ORDER — LOSARTAN POTASSIUM-HCTZ 100-25 MG PO TABS: 1.0000 | ORAL_TABLET | Freq: Every day | ORAL | 1 refills | Status: DC

## 2017-08-20 MED ORDER — AMLODIPINE BESYLATE 5 MG PO TABS: 7.5000 mg | ORAL_TABLET | Freq: Every day | ORAL | 1 refills | Status: DC

## 2017-08-20 MED ORDER — METFORMIN HCL 1000 MG PO TABS: 1000.0000 mg | ORAL_TABLET | Freq: Two times a day (BID) | ORAL | 1 refills | Status: DC

## 2017-08-20 MED ORDER — ATORVASTATIN CALCIUM 20 MG PO TABS: 20.0000 mg | ORAL_TABLET | Freq: Every day | ORAL | 1 refills | Status: DC

## 2017-08-20 NOTE — Telephone Encounter (Signed)
See my chart message

## 2017-10-13 ENCOUNTER — Encounter: Payer: Self-pay | Admitting: Diagnostic Neuroimaging

## 2017-10-14 ENCOUNTER — Other Ambulatory Visit: Payer: Self-pay | Admitting: *Deleted

## 2017-10-14 MED ORDER — PYRIDOSTIGMINE BROMIDE 60 MG PO TABS: 60.0000 mg | ORAL_TABLET | Freq: Two times a day (BID) | ORAL | 2 refills | Status: DC

## 2017-10-14 NOTE — Addendum Note (Signed)
Addended by: Guy Begin on: 10/14/2017 02:21 PM   Modules accepted: Orders

## 2017-12-08 ENCOUNTER — Ambulatory Visit: Payer: Medicare PPO | Admitting: Adult Health

## 2017-12-08 ENCOUNTER — Encounter: Payer: Self-pay | Admitting: Adult Health

## 2017-12-08 VITALS — BP 131/64 | HR 77 | Ht 69.0 in | Wt 223.0 lb

## 2017-12-08 DIAGNOSIS — Z9989 Dependence on other enabling machines and devices: Secondary | ICD-10-CM | POA: Diagnosis not present

## 2017-12-08 DIAGNOSIS — G4733 Obstructive sleep apnea (adult) (pediatric): Secondary | ICD-10-CM | POA: Diagnosis not present

## 2017-12-08 DIAGNOSIS — G7000 Myasthenia gravis without (acute) exacerbation: Secondary | ICD-10-CM

## 2017-12-08 NOTE — Progress Notes (Addendum)
PATIENT: Jonathan Mcgee DOB: [birth year]  REASON FOR VISIT: follow up HISTORY FROM: patient  HISTORY OF PRESENT ILLNESS: Today 12/08/17 Jonathan Mcgee is a 67 year old male with a history of obstructive sleep apnea on CPAP and myasthenia gravis.  He returns today for compliance download.  The patient states that he has not been using his machine.  He reports that he lost his job therefore he no longer had insurance and his DME company came and took the machine as he can no longer pay for.  He reports that he does have insurance now but has not found employment.  The patient is mainly concerned about his financial situation.  Without employment he reports that he is having to use his savings to pay his bills and is concerned that this might run out in the future.  He reports that he is not at a point where he could actually retire.  The patient states that he has been having generalized symptoms of fatigue, joint pain, dizziness with standing and "pain all over."  He continues Mestinon 60 mg twice a day.  He reports diplopia but this is ongoing and has not gotten any worse.  He has ongoing ptosis of the right eye reports that it is stable.  He denies any trouble swallowing.  He denies any difficulty breathing.  He reports that he does have shortness of breath on exertion but this has been ongoing since his diagnosis of myasthenia gravis.  He reports that he is tried higher doses of Mestinon but was unable to tolerate these medications.  He states that he prefers not to go on prednisone unless it is absolutely necessary.  He denies any thoughts of harming himself or others.  He returns today for an evaluation.  HISTORY  07/23/17 Jonathan Mcgee is a 67 year old male with a history of obstructive sleep apnea. His download indicates that he uses machine 23 out of 30 days for compliance of 70%. He uses machine greater than 4 hours 17 out of 30 days for compliance 7%. On average he uses his machine 4 hours and 49  minutes. His residual AHI is 1.6 with 11 cm water with EPR 3. His leak in the 95th percentile was 34.3 L/m. The patient states that he needs to switch to a full face mask. He reports that the nasal pillows leak all night long. He states that he has been trying to go to his DME company however their hours and his work hours have not matched. He does plan to go there today.  REVIEW OF SYSTEMS: Out of a complete 14 system review of symptoms, the patient complains only of the following symptoms, and all other reviewed systems are negative.  Joint pain, aching muscles, muscle cramps, walking difficulty, dizziness, numbness, weakness, fatigue, double vision  ALLERGIES: Allergies  Allergen Reactions  . Lisinopril Swelling    Facial swelling    HOME MEDICATIONS: Outpatient Medications Prior to Visit  Medication Sig Dispense Refill  . amLODipine (NORVASC) 5 MG tablet Take 1.5 tablets (7.5 mg total) by mouth daily. 135 tablet 1  . atorvastatin (LIPITOR) 20 MG tablet Take 1 tablet (20 mg total) by mouth daily. 90 tablet 1  . losartan-hydrochlorothiazide (HYZAAR) 100-25 MG tablet Take 1 tablet by mouth daily. 90 tablet 1  . metFORMIN (GLUCOPHAGE) 1000 MG tablet Take 1 tablet (1,000 mg total) by mouth 2 (two) times daily with a meal. 180 tablet 1  . pyridostigmine (MESTINON) 60 MG tablet Take 1 tablet (60 mg  total) by mouth 2 (two) times daily. 180 tablet 2   No facility-administered medications prior to visit.     PAST MEDICAL HISTORY: Past Medical History:  Diagnosis Date  . Arthritis   . Diabetes mellitus without complication ([facility])   . Hypercholesterolemia   . Hypertension   . Myasthenia gravis ([facility])     PAST SURGICAL HISTORY: Past Surgical History:  Procedure Laterality Date  . KNEE SURGERY Right    fractured patella > 20 yrs ago  . PENILE PROSTHESIS IMPLANT  2015    FAMILY HISTORY: Family History  Problem Relation Age of Onset  . Diabetes Mother   . Heart disease Mother   .  Hyperlipidemia Mother   . Heart attack Mother   . Hyperlipidemia Father   . Kidney failure Father   . Drug abuse Maternal Grandmother   . Heart disease Maternal Grandmother   . Hypertension Maternal Grandmother   . Hyperlipidemia Maternal Grandmother   . Diabetes Maternal Grandmother     SOCIAL HISTORY: Social History   Socioeconomic History  . Marital status: Single    Spouse name: Not on file  . Number of children: 1  . Years of education: 58  . Highest education level: Not on file  Social Needs  . Financial resource strain: Not on file  . Food insecurity - worry: Not on file  . Food insecurity - inability: Not on file  . Transportation needs - medical: Not on file  . Transportation needs - non-medical: Not on file  Occupational History    Comment: [employer]  Tobacco Use  . Smoking status: Current Some Day Smoker    Packs/day: 1.00    Types: Cigars  . Smokeless tobacco: Never Used  Substance and Sexual Activity  . Alcohol use: Yes    Comment: occas  . Drug use: No  . Sexual activity: Not on file  Other Topics Concern  . Not on file  Social History Narrative   Drinks 2-3 caffeine drinks a day       PHYSICAL EXAM  There were no vitals filed for this visit. There is no height or weight on file to calculate BMI.  Generalized: Well developed, in no acute distress   Neurological examination  Mentation: Alert oriented to time, place, history taking. Follows all commands speech and language fluent Cranial nerve II-XII: Pupils were equal round reactive to light. Extraocular movements were full, visual field were full on confrontational test. Facial sensation and strength were normal. Uvula tongue midline. Head turning and shoulder shrug  were normal and symmetric.  With superior gaze for 1 minute no diplopia noted.  Ptosis on the right however it was present before superior gaze Motor: The motor testing reveals 5 over 5 strength of all 4 extremities. Good symmetric motor  tone is noted throughout.  With arms abducted for 1 minute no weakness noted. Sensory: Sensory testing is intact to soft touch on all 4 extremities. No evidence of extinction is noted.  Coordination: Cerebellar testing reveals good finger-nose-finger and heel-to-shin bilaterally.  Gait and station: Gait is normal. Reflexes: Deep tendon reflexes are symmetric and normal bilaterally.   DIAGNOSTIC DATA (LABS, IMAGING, TESTING) - I reviewed patient records, labs, notes, testing and imaging myself where available.  Lab Results  Component Value Date   WBC 7.6 12/31/2016   HGB 12.4 (L) 12/31/2016   HCT 36.3 (L) 12/31/2016   MCV 96 12/31/2016   PLT 231 12/31/2016      Component Value Date/Time  NA 141 07/19/2017 1112   K 4.5 07/19/2017 1112   CL 105 07/19/2017 1112   CO2 21 07/19/2017 1112   GLUCOSE 106 (H) 07/19/2017 1112   GLUCOSE 115 (H) 06/29/2016 1626   BUN 13 07/19/2017 1112   CREATININE 1.37 (H) 07/19/2017 1112   CREATININE 1.39 (H) 06/29/2016 1626   CALCIUM 9.2 07/19/2017 1112   PROT 7.0 07/19/2017 1112   ALBUMIN 4.5 07/19/2017 1112   AST 16 07/19/2017 1112   ALT 11 07/19/2017 1112   ALKPHOS 81 07/19/2017 1112   BILITOT 0.3 07/19/2017 1112   GFRNONAA 53 (L) 07/19/2017 1112   GFRNONAA 53 (L) 06/29/2016 1626   GFRAA 61 07/19/2017 1112   GFRAA 61 06/29/2016 1626   Lab Results  Component Value Date   CHOL 106 07/19/2017   HDL 39 (L) 07/19/2017   LDLCALC 57 07/19/2017   TRIG 51 07/19/2017   CHOLHDL 2.7 07/19/2017   Lab Results  Component Value Date   HGBA1C 6.5 (H) 07/19/2017   No results found for: VITAMINB12 Lab Results  Component Value Date   TSH 3.39 06/29/2016      ASSESSMENT AND PLAN 67 y.o. year old male  has a past medical history of Arthritis, Diabetes mellitus without complication ([facility]), Hypercholesterolemia, Hypertension, and Myasthenia gravis ([facility]). here with :  1.  Obstructive sleep apnea on CPAP 2.  Myasthenia gravis.  I did revise the  patient is now that he has insurance he should consider restarting CPAP therapy.  The patient reports that he will consider this once he obtains employment.  The patient will continue on Mestinon 60 mg twice a day.  I advised that if his symptoms worsen he should let us know.  The patient does have generalized symptoms today of joint pain and pain all over.  I advised that he should follow-up with his primary care provider for evaluation.  He reports understanding.  He will follow-up in May with Dr. Marjory Lies.     Butch Penny, MSN, NP-C 12/08/2017, 2:01 PM [facility] [address], [address] [place], [place] [address] [phone]  I reviewed the above note and documentation by the Nurse Practitioner and agree with the history, physical exam, assessment and plan as outlined above. I was immediately available for face-to-face consultation. Huston Foley, MD, PhD [facility])

## 2017-12-08 NOTE — Patient Instructions (Signed)
Your Plan:  Continue Mestinon 60 mg twice a day Consider restarting CPAP If your symptoms worsen or you develop new symptoms please let us know.   Thank you for coming to see [place] at [facility]. I hope we have been able to provide you high quality care today.  You may receive a patient satisfaction survey over the next few weeks. We would appreciate your feedback and comments so that we may continue to improve ourselves and the health of our patients.

## 2017-12-09 ENCOUNTER — Ambulatory Visit: Payer: 59 | Admitting: Adult Health

## 2017-12-09 NOTE — Progress Notes (Signed)
I reviewed note and agree with plan.   Suanne Marker, MD 12/09/2017, 4:55 PM Certified in Neurology, Neurophysiology and Neuroimaging  [facility] [address] [place], [place] [address] [phone]

## 2017-12-10 ENCOUNTER — Telehealth: Payer: Self-pay | Admitting: Family Medicine

## 2017-12-10 NOTE — Telephone Encounter (Signed)
>>   Dec 10, 2017 12:25 PM Elliot Gault wrote:  Relation to pt: self Call back number: [phone]  Pharmacy: [facility] [address] [place], [place], [place] [address] [phone]    Reason for call:  Patient advised by pharmacy to contact PCP and request alternate due to losartan-hydrochlorothiazide (HYZAAR) 100-25 MG tablet being out of stock, please advise

## 2017-12-12 ENCOUNTER — Telehealth: Payer: Self-pay | Admitting: Family Medicine

## 2017-12-12 ENCOUNTER — Other Ambulatory Visit: Payer: Self-pay | Admitting: Family Medicine

## 2017-12-12 MED ORDER — HYDROCHLOROTHIAZIDE 25 MG PO TABS: 25.0000 mg | ORAL_TABLET | Freq: Every day | ORAL | 0 refills | Status: DC

## 2017-12-12 MED ORDER — LOSARTAN POTASSIUM 100 MG PO TABS: 100.0000 mg | ORAL_TABLET | Freq: Every day | ORAL | 0 refills | Status: DC

## 2017-12-12 NOTE — Telephone Encounter (Signed)
This was handled earlier today - sent individual Rx to his pharmacy.

## 2017-12-12 NOTE — Telephone Encounter (Signed)
Copied from CRM [id number]. Topic: Quick Communication - See Telephone Encounter >> Dec 11, 2017  1:30 PM Guinevere Ferrari, NT wrote: Patient called back because he hasn't heard back from the doctor. Pt said the pharmacy is out of the losartan-hydrochlorothiazide (HYZAAR) 100-25 MG tablet and wanted to see if he can get another medication instead. Pt would like a call back. >> Dec 11, 2017  1:35 PM Guinevere Ferrari, NT wrote: Pt uses  [facility] - [place], [place] - [address] [phone] (Phone) [phone] (Fax)

## 2017-12-12 NOTE — Telephone Encounter (Signed)
Copied from CRM #[id number]. Topic: Quick Communication - See Telephone Encounter >> Dec 11, 2017  1:30 PM Morris, Sharamare E, NT wrote: Patient called back because he hasn't heard back from the doctor. Pt said the pharmacy is out of the losartan-hydrochlorothiazide (HYZAAR) 100-25 MG tablet and wanted to see if he can get another medication instead. Pt would like a call back. >> Dec 11, 2017  1:35 PM Morris, Sharamare E, NT wrote: Pt uses  [facility] - [place], [place] - [address] [phone] (Phone) [phone] (Fax)    

## 2017-12-12 NOTE — Telephone Encounter (Signed)
Please advise 

## 2017-12-12 NOTE — Progress Notes (Signed)
Note received from pharmacy that losartan hctz combo on backorder. Individual components prescribed.

## 2018-03-24 ENCOUNTER — Ambulatory Visit: Payer: Medicare PPO | Admitting: Family Medicine

## 2018-03-24 ENCOUNTER — Encounter: Payer: Self-pay | Admitting: Family Medicine

## 2018-03-24 VITALS — BP 141/71 | HR 80 | Temp 98.8°F | Resp 17 | Ht 69.0 in | Wt 221.0 lb

## 2018-03-24 DIAGNOSIS — E785 Hyperlipidemia, unspecified: Secondary | ICD-10-CM | POA: Diagnosis not present

## 2018-03-24 DIAGNOSIS — N181 Chronic kidney disease, stage 1: Secondary | ICD-10-CM | POA: Diagnosis not present

## 2018-03-24 DIAGNOSIS — E1122 Type 2 diabetes mellitus with diabetic chronic kidney disease: Secondary | ICD-10-CM | POA: Diagnosis not present

## 2018-03-24 DIAGNOSIS — I10 Essential (primary) hypertension: Secondary | ICD-10-CM | POA: Diagnosis not present

## 2018-03-24 DIAGNOSIS — M2550 Pain in unspecified joint: Secondary | ICD-10-CM

## 2018-03-24 DIAGNOSIS — M2540 Effusion, unspecified joint: Secondary | ICD-10-CM | POA: Diagnosis not present

## 2018-03-24 LAB — POCT GLYCOSYLATED HEMOGLOBIN (HGB A1C): Hemoglobin A1C: 8.9

## 2018-03-24 LAB — GLUCOSE, POCT (MANUAL RESULT ENTRY): POC Glucose: 156 mg/dl — AB (ref 70–99)

## 2018-03-24 MED ORDER — LOSARTAN POTASSIUM 100 MG PO TABS: 100.0000 mg | ORAL_TABLET | Freq: Every day | ORAL | 0 refills | Status: DC

## 2018-03-24 MED ORDER — AMLODIPINE BESYLATE 5 MG PO TABS: 7.5000 mg | ORAL_TABLET | Freq: Every day | ORAL | 1 refills | Status: DC

## 2018-03-24 MED ORDER — EMPAGLIFLOZIN 10 MG PO TABS: 10.0000 mg | ORAL_TABLET | Freq: Every day | ORAL | 3 refills | Status: DC

## 2018-03-24 MED ORDER — HYDROCHLOROTHIAZIDE 25 MG PO TABS: 25.0000 mg | ORAL_TABLET | Freq: Every day | ORAL | 0 refills | Status: DC

## 2018-03-24 MED ORDER — ATORVASTATIN CALCIUM 20 MG PO TABS: 20.0000 mg | ORAL_TABLET | Freq: Every day | ORAL | 1 refills | Status: DC

## 2018-03-24 MED ORDER — METFORMIN HCL 1000 MG PO TABS: 1000.0000 mg | ORAL_TABLET | Freq: Two times a day (BID) | ORAL | 1 refills | Status: DC

## 2018-03-24 NOTE — Progress Notes (Signed)
Subjective:  By signing my name below, I, Moises Blood, attest that this documentation has been prepared under the direction and in the presence of Merri Ray, MD. Electronically Signed: Moises Blood, Lydia. 03/24/2018 , 3:04 PM .  Patient was seen in Room 3 .   Patient ID: Jonathan Mcgee, male    DOB: 02-10-1951, 67 y.o.   MRN: [id number] Chief Complaint  Patient presents with  . Medication Refill    cozaar,lipitor,norvasc,hydrodiuril   HPI Jonathan Mcgee is a 67 y.o. male Here for follow up. He had a coffee at 6:30AM today, otherwise fasting. 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   Joint pain and swelling Patient notes having pain and some swelling in his hands that started about 4 weeks ago. He reports having a burning sensation and joint swelling in his index finger initially noticed 4 weeks ago. He also states having left pinky pain, and multiple joint pains in right hand. He mentions having bilateral elbow pains and swelling, but now more stiff and not much pain. He also states having right great toe with swelling and pain for a couple of weeks now. He has history of gout many years ago. He informs having these joint pains and swelling upon waking up. He denies any known injuries. He's been taking tylenol 1066m 2-3 times a day. He's been having pain with walking [place] [employer].   Health maintenance Colonoscopy: due for it, but would like to decline right now due to cost prohibitive. He denies polyps with previous colonoscopy.   Diabetes Lab Results  Component Value Date   HGBA1C 6.5 (H) 07/19/2017   Micro albumin: 13.8 in Feb 2018.  Last discussed in Aug 2018, at that time, he was continued on metformin 10033mBID with plan for 3 month follow up. He is on ARB as well as statin. He denies missing doses of his metformin.   Pneumovax: due for this, but declines today Optho exam: 04/25/17 Foot exam: 04/12/17  HTN BP  Readings from Last 3 Encounters:  03/24/18 (!) 141/71  12/08/17 131/64  07/23/17 (!) 151/75   Lab Results  Component Value Date   CREATININE 1.37 (H) 07/19/2017   eGFR 53 decreased from 1.46 in Feb 2018. Amlodipine was increased last visit to 7.109m2109mD and continued on losartan 100m44m, as well as HCTZ 2109mg32m    Hyperlipidemia He is on Lipitor 20mg 81m   Patient Active Problem List   Diagnosis Date Noted  . CKD (chronic kidney disease) 04/12/2017  . Essential hypertension 01/13/2017  . Hyperlipidemia 01/13/2017  . Myasthenia gravis (HCC) 1[place]4/2017   Past Medical History:  Diagnosis Date  . Arthritis   . Diabetes mellitus without complication (HCC)  [place]   . Hypertension   . [facility]asthenia gravis (HCC) [facility]:  Procedure Laterality Date  . KNEE SURGERY Right    fractured patella > 20 yrs ago  . PENILE PROSTHESIS IMPLANT  2015   Allergies  Allergen Reactions  . Lisinopril Swelling    Facial swelling   Prior to Admission medications   Medication Sig Start Date End Date Taking? Authorizing Provider  amLODipine (NORVASC) 5 MG tablet Take 1.5 tablets (7.5 mg total) by mouth daily. 08/20/17   GreeneWendie Agresteatorvastatin (LIPITOR) 20 MG tablet Take 1 tablet (20 mg total) by mouth daily. 08/20/17   GreeneWendie Agrestehydrochlorothiazide (  HYDRODIURIL) 25 MG tablet Take 1 tablet (25 mg total) by mouth daily. 12/12/17   Wendie Agreste, MD  losartan (COZAAR) 100 MG tablet Take 1 tablet (100 mg total) by mouth daily. 12/12/17   Wendie Agreste, MD  metFORMIN (GLUCOPHAGE) 1000 MG tablet Take 1 tablet (1,000 mg total) by mouth 2 (two) times daily with a meal. 08/20/17   Wendie Agreste, MD  pyridostigmine (MESTINON) 60 MG tablet Take 1 tablet (60 mg total) by mouth 2 (two) times daily. 10/14/17   Ward Givens, NP   Social History   Socioeconomic History  . Marital status: Single    Spouse name: Not on file  . Number of  children: 1  . Years of education: 84  . Highest education level: Not on file  Occupational History    Comment: [employer]  Social Needs  . Financial resource strain: Not on file  . Food insecurity:    Worry: Not on file    Inability: Not on file  . Transportation needs:    Medical: Not on file    Non-medical: Not on file  Tobacco Use  . Smoking status: Current Some Day Smoker    Packs/day: 1.00    Types: Cigars  . Smokeless tobacco: Never Used  Substance and Sexual Activity  . Alcohol use: Yes    Comment: occas  . Drug use: No  . Sexual activity: Not on file  Lifestyle  . Physical activity:    Days per week: Not on file    Minutes per session: Not on file  . Stress: Not on file  Relationships  . Social connections:    Talks on phone: Not on file    Gets together: Not on file    Attends religious service: Not on file    Active member of club or organization: Not on file    Attends meetings of clubs or organizations: Not on file    Relationship status: Not on file  . Intimate partner violence:    Fear of current or ex partner: Not on file    Emotionally abused: Not on file    Physically abused: Not on file    Forced sexual activity: Not on file  Other Topics Concern  . Not on file  Social History Narrative   Drinks 2-3 caffeine drinks a day    Review of Systems  Constitutional: Negative for fatigue and unexpected weight change.  Eyes: Negative for visual disturbance.  Respiratory: Negative for cough, chest tightness and shortness of breath.   Cardiovascular: Negative for chest pain, palpitations and leg swelling.  Gastrointestinal: Negative for abdominal pain and blood in stool.  Musculoskeletal: Positive for arthralgias, gait problem, joint swelling and myalgias.  Neurological: Negative for dizziness, weakness, light-headedness, numbness and headaches.       Objective:   Physical Exam  Constitutional: He is oriented to person, place, and time. He appears  well-developed and well-nourished.  HENT:  Head: Normocephalic and atraumatic.  Eyes: Pupils are equal, round, and reactive to light. EOM are normal.  Neck: No JVD present. Carotid bruit is not present.  Cardiovascular: Normal rate, regular rhythm and normal heart sounds.  No murmur heard. Pulmonary/Chest: Effort normal and breath sounds normal. He has no rales.  Musculoskeletal: He exhibits edema and tenderness.  Right foot: some prominence of the right MTP with slight warmth, no skin breakdown Left hand: some prominence left 2nd MCP  Neurological: He is alert and oriented to person, place, and time.  Skin: Skin is warm and dry.  Psychiatric: He has a normal mood and affect.  Vitals reviewed.   Vitals:   03/24/18 1426  BP: (!) 141/71  Pulse: 80  Resp: 17  Temp: 98.8 F (37.1 C)  TempSrc: Oral  SpO2: 98%  Weight: 221 lb (100.2 kg)  Height: 5' 9"  (1.753 m)   Results for orders placed or performed in visit on 03/24/18  POCT glucose (manual entry)  Result Value Ref Range   POC Glucose 156 (A) 70 - 99 mg/dl  POCT glycosylated hemoglobin (Hb A1C)  Result Value Ref Range   Hemoglobin A1C 8.9        Assessment & Plan:   Americus Perkey is a 67 y.o. male Type 2 diabetes mellitus with stage 1 chronic kidney disease, without long-term current use of insulin ([place]) - Plan: Microalbumin, urine, POCT glucose (manual entry), POCT glycosylated hemoglobin (Hb A1C), empagliflozin (JARDIANCE) 10 MG TABS tablet, metFORMIN (GLUCOPHAGE) 1000 MG tablet Type 2 diabetes mellitus with chronic kidney disease, without long-term current use of insulin, unspecified CKD stage ([place]) - Plan: metFORMIN (GLUCOPHAGE) 1000 MG tablet  -Unfortunately has had worsening control.  Will add Jardiance 10 mg daily along with continuing metformin at same dose.  Monitor home readings, repeat A1c in 3 months  Essential hypertension - Plan: Comprehensive metabolic panel, losartan (COZAAR) 100 MG tablet,  hydrochlorothiazide (HYDRODIURIL) 25 MG tablet, amLODipine (NORVASC) 5 MG tablet  -Borderline elevated, monitor home readings and if remaining over 140/90, increase amlodipine to 10 mg daily.  Hyperlipidemia, unspecified hyperlipidemia type - Plan: Lipid panel, atorvastatin (LIPITOR) 20 MG tablet  -Tolerating Lipitor at current dose.  Check labs, meds refilled  Swelling of multiple joints - Plan: Sedimentation Rate, Uric Acid, ANA,IFA RA Diag Pnl w/rflx Tit/Patn Polyarthralgia  -Suspect gout based on symptoms and location.  Will check for other inflammatory arthropathies with labs above.  Initially planned on prednisone, but with worsening diabetes control decided to hold on that at this time.  Depending on lab work and upcoming symptom improvement can still consider prednisone once he has been on Jardiance for improved diabetic control.   Meds ordered this encounter  Medications  . empagliflozin (JARDIANCE) 10 MG TABS tablet    Sig: Take 10 mg by mouth daily.    Dispense:  30 tablet    Refill:  3  . metFORMIN (GLUCOPHAGE) 1000 MG tablet    Sig: Take 1 tablet (1,000 mg total) by mouth 2 (two) times daily with a meal.    Dispense:  180 tablet    Refill:  1  . losartan (COZAAR) 100 MG tablet    Sig: Take 1 tablet (100 mg total) by mouth daily.    Dispense:  90 tablet    Refill:  0  . hydrochlorothiazide (HYDRODIURIL) 25 MG tablet    Sig: Take 1 tablet (25 mg total) by mouth daily.    Dispense:  90 tablet    Refill:  0  . amLODipine (NORVASC) 5 MG tablet    Sig: Take 1.5 tablets (7.5 mg total) by mouth daily.    Dispense:  135 tablet    Refill:  1  . atorvastatin (LIPITOR) 20 MG tablet    Sig: Take 1 tablet (20 mg total) by mouth daily.    Dispense:  90 tablet    Refill:  1   Patient Instructions    For colon cancer screening - I would recommend discussing cost with your insurance carrier to discuss the cost of  colonoscopy, but Cologuard would be an option if that is more cost  effective.  I will check some tests for inflammation, but gout may be causing your symtpoms.   Unfortunately with your elevated blood sugar and diabetes control, would not recommend starting prednisone at this time.  Start new diabetes medication, Tylenol as needed for pain for now, then if not improving within the next 1 week, we should have some blood work at that time to see if this does look like gout and possibly prednisone at that time with close monitoring of your blood sugar.  Monitor blood pressure outside of office and if readings are over 140/90, increase amlodipine to 10 mg/day, or 2 pills.  For now okay to continue 1-1/2 pills/day, and the same doses of your medication.  Recheck in 3 months, sooner if worse.  IF you received an x-ray today, you will receive an invoice from [facility]. Please contact [facility] Radiology at [phone] with questions or concerns regarding your invoice.   IF you received labwork today, you will receive an invoice from [place]. Please contact [facility] at [phone] with questions or concerns regarding your invoice.   Our billing staff will not be able to assist you with questions regarding bills from these companies.  You will be contacted with the lab results as soon as they are available. The fastest way to get your results is to activate your My Chart account. Instructions are located on the last page of this paperwork. If you have not heard from [place] regarding the results in 2 weeks, please contact this office.       I personally performed the services described in this documentation, which was scribed in my presence. The recorded information has been reviewed and considered for accuracy and completeness, addended by me as needed, and agree with information above.  Signed,   Merri Ray, MD [facility].  03/25/18 10:02 PM

## 2018-03-24 NOTE — Patient Instructions (Addendum)
  For colon cancer screening - I would recommend discussing cost with your insurance carrier to discuss the cost of colonoscopy, but Cologuard would be an option if that is more cost effective.  I will check some tests for inflammation, but gout may be causing your symtpoms.   Unfortunately with your elevated blood sugar and diabetes control, would not recommend starting prednisone at this time.  Start new diabetes medication, Tylenol as needed for pain for now, then if not improving within the next 1 week, we should have some blood work at that time to see if this does look like gout and possibly prednisone at that time with close monitoring of your blood sugar.  Monitor blood pressure outside of office and if readings are over 140/90, increase amlodipine to 10 mg/day, or 2 pills.  For now okay to continue 1-1/2 pills/day, and the same doses of your medication.  Recheck in 3 months, sooner if worse.  IF you received an x-ray today, you will receive an invoice from [facility]. Please contact [facility] Radiology at [phone] with questions or concerns regarding your invoice.   IF you received labwork today, you will receive an invoice from [place]. Please contact [facility] at [phone] with questions or concerns regarding your invoice.   Our billing staff will not be able to assist you with questions regarding bills from these companies.  You will be contacted with the lab results as soon as they are available. The fastest way to get your results is to activate your My Chart account. Instructions are located on the last page of this paperwork. If you have not heard from [place] regarding the results in 2 weeks, please contact this office.

## 2018-03-25 ENCOUNTER — Encounter: Payer: Self-pay | Admitting: Family Medicine

## 2018-03-25 LAB — MICROALBUMIN, URINE: Microalbumin, Urine: 37.5 ug/mL

## 2018-03-26 LAB — COMPREHENSIVE METABOLIC PANEL
ALBUMIN: 4.3 g/dL (ref 3.6–4.8)
ALK PHOS: 97 IU/L (ref 39–117)
ALT: 14 IU/L (ref 0–44)
AST: 14 IU/L (ref 0–40)
Albumin/Globulin Ratio: 1.4 (ref 1.2–2.2)
BUN / CREAT RATIO: 12 (ref 10–24)
BUN: 16 mg/dL (ref 8–27)
CHLORIDE: 106 mmol/L (ref 96–106)
CO2: 21 mmol/L (ref 20–29)
CREATININE: 1.39 mg/dL — AB (ref 0.76–1.27)
Calcium: 9 mg/dL (ref 8.6–10.2)
GFR calc Af Amer: 60 mL/min/{1.73_m2} (ref >59)
GFR calc non Af Amer: 52 mL/min/{1.73_m2} — ABNORMAL LOW (ref >59)
GLOBULIN, TOTAL: 3 g/dL (ref 1.5–4.5)
Glucose: 147 mg/dL — ABNORMAL HIGH (ref 65–99)
POTASSIUM: 4.5 mmol/L (ref 3.5–5.2)
SODIUM: 144 mmol/L (ref 134–144)
Total Protein: 7.3 g/dL (ref 6.0–8.5)

## 2018-03-26 LAB — ANA,IFA RA DIAG PNL W/RFLX TIT/PATN
ANA TITER 1: NEGATIVE
CYCLIC CITRULLIN PEPTIDE AB: 185 U — AB (ref 0–19)

## 2018-03-26 LAB — LIPID PANEL
CHOLESTEROL TOTAL: 130 mg/dL (ref 100–199)
Chol/HDL Ratio: 3.9 ratio (ref 0.0–5.0)
HDL: 33 mg/dL — ABNORMAL LOW (ref >39)
LDL Calculated: 76 mg/dL (ref 0–99)
TRIGLYCERIDES: 104 mg/dL (ref 0–149)
VLDL Cholesterol Cal: 21 mg/dL (ref 5–40)

## 2018-03-26 LAB — SEDIMENTATION RATE: Sed Rate: 38 mm/hr — ABNORMAL HIGH (ref 0–30)

## 2018-03-26 LAB — URIC ACID: URIC ACID: 4.7 mg/dL (ref 3.7–8.6)

## 2018-04-04 ENCOUNTER — Encounter: Payer: Self-pay | Admitting: Family Medicine

## 2018-04-08 ENCOUNTER — Other Ambulatory Visit: Payer: Self-pay | Admitting: Family Medicine

## 2018-04-08 ENCOUNTER — Encounter: Payer: Self-pay | Admitting: Family Medicine

## 2018-04-08 DIAGNOSIS — M2550 Pain in unspecified joint: Secondary | ICD-10-CM

## 2018-04-15 ENCOUNTER — Ambulatory Visit (INDEPENDENT_AMBULATORY_CARE_PROVIDER_SITE_OTHER): Payer: Medicare PPO | Admitting: Diagnostic Neuroimaging

## 2018-04-15 ENCOUNTER — Encounter: Payer: Self-pay | Admitting: Diagnostic Neuroimaging

## 2018-04-15 VITALS — BP 125/64 | HR 72 | Ht 69.0 in | Wt 218.0 lb

## 2018-04-15 DIAGNOSIS — G7000 Myasthenia gravis without (acute) exacerbation: Secondary | ICD-10-CM | POA: Diagnosis not present

## 2018-04-15 MED ORDER — PYRIDOSTIGMINE BROMIDE 60 MG PO TABS: 60.0000 mg | ORAL_TABLET | Freq: Two times a day (BID) | ORAL | 4 refills | Status: DC

## 2018-04-15 NOTE — Progress Notes (Signed)
[facility]  PATIENT: Jonathan Mcgee DOB: 1950/12/06  REFERRING CLINICIAN: Deliah Boston, PA-c HISTORY FROM: patient  REASON FOR VISIT: follow up     HISTORICAL  CHIEF COMPLAINT:  Chief Complaint  Patient presents with  . Follow-up    Would like printed prescription for mestinon ([place] to [place] in [place]  . Myasthenia Gravis    Continues with double vision, blurriness.  Has good and bad days.  Has muscle spasms.     HISTORY OF PRESENT ILLNESS:   UPDATE (04/15/18, VRP): Since last visit, doing about the same with MG. Tolerating mestinon. No alleviating or aggravating factors. Now planning to move to [place] for work.  UPDATE 07/16/17: Since last visit, overall stable. Mild generalized fatigue continues. Overall stable. Notices some more muscle strain.   UPDATE 02/12/17: Since last visit, BP is better (meds have been adjusted). Now has CPAP machine (about to start). Sugars are better now (averaging ~150's). Intermittent double vision continues. Mild generalized fatigue, but no focal weakness.   UPDATE 12/31/16: Since last visit, IVIG completed, and general energy little better, but muscle cramps worse. BP running high. He has not checked sugars lately. PSG showed mod-severe OSA, and CPAP titration study is pending. Tolerating mestinon 60mg  BID.   UPDATE 10/09/16: Since last visit, double vision is slightly better on mestinon 60mg  TID. However having more muscle cramps, excess saliva production. Fatigable weakness is persistent.   PRIOR HPI (08/28/16): 67 year old ambidextrous male with hypertension, diabetes, hypercholesterolemia here for evaluation of myasthenia gravis. For past 1-2 years patient has had mild intermittent right-sided ptosis. In May 2017 he noticed onset of double vision, blurred vision, wavy vision. This is progressively worsened. For past 1 month he has had increasing fatigue with walking. Is having difficulty with upper and lower  extremity strength. He denies any speech, swallowing, breathing difficulty. His balance has been slightly off. Patient went to PCP, ophthalmology, had [facility] antibody testing which confirmed diagnosis of myasthenia gravis.   REVIEW OF SYSTEMS: Full 14 system review of systems performed and negative with exception of: weakness shortness of breath back pain blurred vision.    ALLERGIES: Allergies  Allergen Reactions  . Lisinopril Swelling    Facial swelling    HOME MEDICATIONS: Outpatient Medications Prior to Visit  Medication Sig Dispense Refill  . amLODipine (NORVASC) 5 MG tablet Take 1.5 tablets (7.5 mg total) by mouth daily. 135 tablet 1  . atorvastatin (LIPITOR) 20 MG tablet Take 1 tablet (20 mg total) by mouth daily. 90 tablet 1  . empagliflozin (JARDIANCE) 10 MG TABS tablet Take 10 mg by mouth daily. 30 tablet 3  . hydrochlorothiazide (HYDRODIURIL) 25 MG tablet Take 1 tablet (25 mg total) by mouth daily. 90 tablet 0  . losartan (COZAAR) 100 MG tablet Take 1 tablet (100 mg total) by mouth daily. 90 tablet 0  . metFORMIN (GLUCOPHAGE) 1000 MG tablet Take 1 tablet (1,000 mg total) by mouth 2 (two) times daily with a meal. 180 tablet 1  . pyridostigmine (MESTINON) 60 MG tablet Take 1 tablet (60 mg total) by mouth 2 (two) times daily. 180 tablet 2   No facility-administered medications prior to visit.     PAST MEDICAL HISTORY: Past Medical History:  Diagnosis Date  . Arthritis   . Diabetes mellitus without complication (HCC)   . Hypercholesterolemia   . Hypertension   . Myasthenia gravis (HCC)   . Rheumatoid arthritis (HCC)     PAST SURGICAL HISTORY: Past Surgical History:  Procedure  Laterality Date  . KNEE SURGERY Right    fractured patella > 20 yrs ago  . PENILE PROSTHESIS IMPLANT  2015    FAMILY HISTORY: Family History  Problem Relation Age of Onset  . Diabetes Mother   . Heart disease Mother   . Hyperlipidemia Mother   . Heart attack Mother   . Hyperlipidemia  Father   . Kidney failure Father   . Drug abuse Maternal Grandmother   . Heart disease Maternal Grandmother   . Hypertension Maternal Grandmother   . Hyperlipidemia Maternal Grandmother   . Diabetes Maternal Grandmother     SOCIAL HISTORY:  Social History   Socioeconomic History  . Marital status: Single    Spouse name: Not on file  . Number of children: 1  . Years of education: 40  . Highest education level: Not on file  Occupational History    Comment: [employer]  Social Needs  . Financial resource strain: Not on file  . Food insecurity:    Worry: Not on file    Inability: Not on file  . Transportation needs:    Medical: Not on file    Non-medical: Not on file  Tobacco Use  . Smoking status: Current Some Day Smoker    Packs/day: 1.00    Types: Cigars  . Smokeless tobacco: Never Used  Substance and Sexual Activity  . Alcohol use: Yes    Comment: occas  . Drug use: No  . Sexual activity: Not on file  Lifestyle  . Physical activity:    Days per week: Not on file    Minutes per session: Not on file  . Stress: Not on file  Relationships  . Social connections:    Talks on phone: Not on file    Gets together: Not on file    Attends religious service: Not on file    Active member of club or organization: Not on file    Attends meetings of clubs or organizations: Not on file    Relationship status: Not on file  . Intimate partner violence:    Fear of current or ex partner: Not on file    Emotionally abused: Not on file    Physically abused: Not on file    Forced sexual activity: Not on file  Other Topics Concern  . Not on file  Social History Narrative   Drinks 2-3 caffeine drinks a day      PHYSICAL EXAM  GENERAL EXAM/CONSTITUTIONAL: Vitals:  Vitals:   04/15/18 1319  BP: 125/64  Pulse: 72  Weight: 218 lb (98.9 kg)  Height: 5\' 9"  (1.753 m)    Wt Readings from Last 3 Encounters:  04/15/18 218 lb (98.9 kg)  03/24/18 221 lb (100.2 kg)  12/08/17 223 lb  (101.2 kg)   Body mass index is 32.19 kg/m.  Visual Acuity Screening   Right eye Left eye Both eyes  Without correction:     With correction: 20/30 20/40     Patient is in no distress; well developed, nourished and groomed; neck is supple  CARDIOVASCULAR:  Examination of carotid arteries is normal; no carotid bruits  Regular rate and rhythm, no murmurs  Examination of peripheral vascular system by observation and palpation is normal  EYES:  Ophthalmoscopic exam of optic discs and posterior segments is normal  MUSCULOSKELETAL:  Gait, strength, tone, movements noted in Neurologic exam below  NEUROLOGIC: MENTAL STATUS:  No flowsheet data found.  awake, alert, oriented to person, place and time  recent  and remote memory intact  normal attention and concentration  language fluent, comprehension intact, naming intact,   fund of knowledge appropriate  CRANIAL NERVE:   2nd - no papilledema on fundoscopic exam  2nd, 3rd, 4th, 6th - pupils equal and reactive to light, visual fields full to confrontation, extraocular muscles --> NO DOUBLE VISION; NORMAL EYE MOVEMENTS; no nystagmus  5th - facial sensation symmetric  7th - facial strength symmetric  8th - hearing intact  9th - palate elevates symmetrically, uvula midline  11th - shoulder shrug symmetric  12th - tongue protrusion midline  MOTOR:   normal bulk and tone, full strength in the BUE, BLE  SENSORY:   normal and symmetric to light touch, temperature, vibration  COORDINATION:   finger-nose-finger, fine finger movements normal  REFLEXES:   deep tendon reflexes TRACE and symmetric; ABSENT AT KNEES AND ANKLES  GAIT/STATION:   narrow based gait    DIAGNOSTIC DATA (LABS, IMAGING, TESTING) - I reviewed patient records, labs, notes, testing and imaging myself where available.  Lab Results  Component Value Date   WBC 7.6 12/31/2016   HGB 12.4 (L) 12/31/2016   HCT 36.3 (L) 12/31/2016   MCV 96  12/31/2016   PLT 231 12/31/2016      Component Value Date/Time   NA 144 03/24/2018 1659   K 4.5 03/24/2018 1659   CL 106 03/24/2018 1659   CO2 21 03/24/2018 1659   GLUCOSE 147 (H) 03/24/2018 1659   GLUCOSE 115 (H) 06/29/2016 1626   BUN 16 03/24/2018 1659   CREATININE 1.39 (H) 03/24/2018 1659   CREATININE 1.39 (H) 06/29/2016 1626   CALCIUM 9.0 03/24/2018 1659   PROT 7.3 03/24/2018 1659   ALBUMIN 4.3 03/24/2018 1659   AST 14 03/24/2018 1659   ALT 14 03/24/2018 1659   ALKPHOS 97 03/24/2018 1659   BILITOT <0.2 03/24/2018 1659   GFRNONAA 52 (L) 03/24/2018 1659   GFRNONAA 53 (L) 06/29/2016 1626   GFRAA 60 03/24/2018 1659   GFRAA 61 06/29/2016 1626   Lab Results  Component Value Date   CHOL 130 03/24/2018   HDL 33 (L) 03/24/2018   LDLCALC 76 03/24/2018   TRIG 104 03/24/2018   CHOLHDL 3.9 03/24/2018   Lab Results  Component Value Date   HGBA1C 8.9 03/24/2018   No results found for: VITAMINB12 Lab Results  Component Value Date   TSH 3.39 06/29/2016    07/04/16 CT head [I reviewed images myself and agree with interpretation. -VRP]  - Mild microvascular disease without acute intracranial process.  09/04/16 CT chest  1. No mediastinal mass or adenopathy. 2. No infiltrate or pulmonary edema. 3. Degenerative changes thoracic spine. 4. Atherosclerotic calcifications of thoracic aorta. 5. Mild fatty infiltration of the liver.     ASSESSMENT AND PLAN  67 y.o. year old male here with new onset diagnosis of myasthenia gravis with ocular and generalized features. Was having side effects from mestinon, but also some improvement in ocular symptoms. IVIG has helped generalized symptoms.    Dx: (ocular >> generalized) myasthenia gravis  No diagnosis found.   PLAN:  - continue pyridostigmine 60mg  twice a day  - monitor for generalized myasthenia gravis symptoms; patient is slightly better now, so will hold off on prednisone for now  - in future may consider prednisone  60mg  daily for longer term treatment of myasthenia gravis; also would then consider prednisone taper +/- steroid sparing therapy (imuran, cellcept or soliris)  - continue BP and DM control  - continue  CPAP usage  Meds ordered this encounter  Medications  . pyridostigmine (MESTINON) 60 MG tablet    Sig: Take 1 tablet (60 mg total) by mouth 2 (two) times daily.    Dispense:  180 tablet    Refill:  4   No follow-ups on file.     Suanne Marker, MD 04/15/2018, 2:01 PM Certified in Neurology, Neurophysiology and Neuroimaging  [facility] [address], [address] [place], [place] [address] [phone]

## 2018-04-30 ENCOUNTER — Telehealth: Payer: Self-pay | Admitting: Family Medicine

## 2018-04-30 NOTE — Telephone Encounter (Signed)
Copied from CRM [id number]. Topic: Quick Communication - See Telephone Encounter >> Apr 30, 2018  1:13 PM Arlyss Gandy, NT wrote: CRM for notification. See Telephone encounter for: 04/30/18. Mary a Engineer, civil (consulting) with [employer] in [place] states this pt is requesting medical parking and is needing a note from Dr. Neva Seat faxed to them that states this pt does need medical parking and able to park close to the building. And the dates of how long pt will need this parking. CB#: [phone] or [phone]. Fax#: [phone]

## 2018-04-30 NOTE — Telephone Encounter (Signed)
Please advise 

## 2018-05-01 NOTE — Telephone Encounter (Signed)
Letter completed and will have this faxed.

## 2018-05-05 ENCOUNTER — Encounter: Payer: Self-pay | Admitting: Family Medicine

## 2018-06-23 ENCOUNTER — Other Ambulatory Visit: Payer: Self-pay | Admitting: Family Medicine

## 2018-06-23 DIAGNOSIS — I10 Essential (primary) hypertension: Secondary | ICD-10-CM

## 2018-06-23 MED ORDER — LOSARTAN POTASSIUM 100 MG PO TABS: 100.0000 mg | ORAL_TABLET | Freq: Every day | ORAL | 0 refills | Status: DC

## 2018-06-23 NOTE — Telephone Encounter (Signed)
Copied from CRM [id number]. Topic: Quick Communication - Rx Refill/Question >> Jun 23, 2018 12:29 PM Jolayne Haines L wrote: Medication: losartan (COZAAR) 100 MG tablet  Has the patient contacted their pharmacy? Yes he said they contacted him to get authorization because there is no refills (Agent: If no, request that the patient contact the pharmacy for the refill.) (Agent: If yes, when and what did the pharmacy advise?)  Preferred Pharmacy (with phone number or street name): [facility] [address], [place] - [address] [place] [address] Phone: [phone] Fax: [phone]    Agent: Please be advised that RX refills may take up to 3 business days. We ask that you follow-up with your pharmacy.

## 2018-07-17 ENCOUNTER — Encounter: Payer: Self-pay | Admitting: Family Medicine

## 2018-07-17 DIAGNOSIS — H25013 Cortical age-related cataract, bilateral: Secondary | ICD-10-CM | POA: Diagnosis not present

## 2018-07-17 DIAGNOSIS — H35033 Hypertensive retinopathy, bilateral: Secondary | ICD-10-CM | POA: Diagnosis not present

## 2018-07-17 DIAGNOSIS — E119 Type 2 diabetes mellitus without complications: Secondary | ICD-10-CM | POA: Diagnosis not present

## 2018-07-17 DIAGNOSIS — H2513 Age-related nuclear cataract, bilateral: Secondary | ICD-10-CM | POA: Diagnosis not present

## 2018-07-17 LAB — HM DIABETES EYE EXAM

## 2018-08-21 ENCOUNTER — Telehealth: Payer: Self-pay | Admitting: Family Medicine

## 2018-08-21 NOTE — Telephone Encounter (Signed)
Copied from CRM [id number]. Topic: Appointment Scheduling - Scheduling Inquiry for Clinic >> Aug 21, 2018  2:40 PM Jonathan Mcgee B wrote: Reason for CRM: pt called b/c due to work he is traveling a lot and isnt able to connect w/ pcp; pt is asking if upon his return to be fitted in on the 12th of OCT when Dr. Neva Seat is working in order to go over labs and get the necessary med refills; or if not able to be seen if he can get in w/ a colleague of [place]'s choice to help w/ this; contact pt to advise

## 2018-09-12 NOTE — Telephone Encounter (Signed)
Addendum, The pt. Would like to know if it is necessary for him to come for a visit to get his medication refills. Pt. Is requesting refills on all medication prescribed by Dr. Neva Seat excluding [place] Pepper. Pt. Would like to be called with decision at his preferred number [phone].  P.s. Sorry, the power went out mid message.

## 2018-09-12 NOTE — Telephone Encounter (Signed)
Pt. Has called again to request to be scheduled with Dr. Neva Seat, on finding out that Dr. Paralee Cancel next Saturday appt. Is available on 10/17/18, pt would like to request med refills on all medications prescribed by Dr. Chilton Si until that

## 2018-09-13 NOTE — Telephone Encounter (Signed)
Pt was due for follow up in July ( 3 mo after visit with Dr. Neva Seat) and should have run out of some of his meds then. Chart states he has relocated to [place].  If appt available on Sat with another provider, please make appt. Thanks

## 2018-09-15 ENCOUNTER — Other Ambulatory Visit: Payer: Self-pay | Admitting: Family Medicine

## 2018-09-15 DIAGNOSIS — I10 Essential (primary) hypertension: Secondary | ICD-10-CM

## 2018-09-15 MED ORDER — LOSARTAN POTASSIUM 100 MG PO TABS: 100.0000 mg | ORAL_TABLET | Freq: Every day | ORAL | 0 refills | Status: DC

## 2018-09-15 MED ORDER — HYDROCHLOROTHIAZIDE 25 MG PO TABS: 25.0000 mg | ORAL_TABLET | Freq: Every day | ORAL | 0 refills | Status: DC

## 2018-09-17 ENCOUNTER — Other Ambulatory Visit: Payer: Self-pay

## 2018-09-17 DIAGNOSIS — E785 Hyperlipidemia, unspecified: Secondary | ICD-10-CM

## 2018-09-17 DIAGNOSIS — N181 Chronic kidney disease, stage 1: Secondary | ICD-10-CM

## 2018-09-17 DIAGNOSIS — E1122 Type 2 diabetes mellitus with diabetic chronic kidney disease: Secondary | ICD-10-CM

## 2018-09-17 DIAGNOSIS — I10 Essential (primary) hypertension: Secondary | ICD-10-CM

## 2018-09-17 MED ORDER — ATORVASTATIN CALCIUM 20 MG PO TABS: 20.0000 mg | ORAL_TABLET | Freq: Every day | ORAL | 0 refills | Status: DC

## 2018-09-17 MED ORDER — METFORMIN HCL 1000 MG PO TABS: 1000.0000 mg | ORAL_TABLET | Freq: Two times a day (BID) | ORAL | 0 refills | Status: DC

## 2018-09-17 MED ORDER — AMLODIPINE BESYLATE 5 MG PO TABS: 7.5000 mg | ORAL_TABLET | Freq: Every day | ORAL | 0 refills | Status: DC

## 2018-09-17 NOTE — Telephone Encounter (Signed)
Patient has called again as he has not heard anything regarding appt and medication refills. Appt has been scheduled for Saturday 10/24/18 at 10am with Benjiman Core per Okey Dupre, RN instruction.  Pharmacy advised patient that he has no refills for the following medications and will run out before followup appointment. Please refill medications below to hold patient over until his f/u on 10/24/18. Please advise.  amLODipine (NORVASC) 5 MG tablet atorvastatin (LIPITOR) 20 MG tablet metFORMIN (GLUCOPHAGE) 1000 MG tablet  empagliflozin (JARDIANCE) 10 MG TABS tablet   [id number] - , [place] - [address] [phone] (Phone) [phone] (Fax)

## 2018-10-05 ENCOUNTER — Telehealth: Payer: Self-pay | Admitting: Physician Assistant

## 2018-10-05 NOTE — Telephone Encounter (Signed)
LVM fo rpt to call back to the office and reschedule with a different provider due to Skene leaving. If pt calls back, please reschedule with a different provider. Thank you!

## 2018-10-17 ENCOUNTER — Ambulatory Visit: Payer: Medicare PPO | Admitting: Family Medicine

## 2018-10-20 ENCOUNTER — Other Ambulatory Visit: Payer: Self-pay | Admitting: Diagnostic Neuroimaging

## 2018-10-21 ENCOUNTER — Other Ambulatory Visit: Payer: Self-pay | Admitting: *Deleted

## 2018-10-21 ENCOUNTER — Telehealth: Payer: Self-pay | Admitting: Diagnostic Neuroimaging

## 2018-10-21 MED ORDER — PYRIDOSTIGMINE BROMIDE 60 MG PO TABS: 60.0000 mg | ORAL_TABLET | Freq: Two times a day (BID) | ORAL | 3 refills | Status: DC

## 2018-10-21 NOTE — Telephone Encounter (Signed)
Pt called stating that [facility] does not have refills for pyridostigmine (MESTINON) 60 MG tablet stating their rx has expired. please advised

## 2018-10-21 NOTE — Telephone Encounter (Signed)
I emailed pt to let him know that renewed his prescription to [facility] on [address].

## 2018-10-24 ENCOUNTER — Ambulatory Visit: Payer: BLUE CROSS/BLUE SHIELD | Admitting: Family Medicine

## 2018-10-24 ENCOUNTER — Ambulatory Visit: Payer: Medicare PPO | Admitting: Physician Assistant

## 2018-10-24 ENCOUNTER — Other Ambulatory Visit: Payer: Self-pay

## 2018-10-24 ENCOUNTER — Encounter: Payer: Self-pay | Admitting: Family Medicine

## 2018-10-24 VITALS — BP 152/75 | HR 59 | Temp 98.5°F | Ht 69.0 in | Wt 220.8 lb

## 2018-10-24 DIAGNOSIS — N181 Chronic kidney disease, stage 1: Secondary | ICD-10-CM | POA: Diagnosis not present

## 2018-10-24 DIAGNOSIS — E785 Hyperlipidemia, unspecified: Secondary | ICD-10-CM

## 2018-10-24 DIAGNOSIS — E1122 Type 2 diabetes mellitus with diabetic chronic kidney disease: Secondary | ICD-10-CM

## 2018-10-24 DIAGNOSIS — I10 Essential (primary) hypertension: Secondary | ICD-10-CM | POA: Diagnosis not present

## 2018-10-24 LAB — POCT GLYCOSYLATED HEMOGLOBIN (HGB A1C): Hemoglobin A1C: 7.5 % — AB (ref 4.0–5.6)

## 2018-10-24 MED ORDER — ATORVASTATIN CALCIUM 20 MG PO TABS: 20.0000 mg | ORAL_TABLET | Freq: Every day | ORAL | 0 refills | Status: DC

## 2018-10-24 MED ORDER — LOSARTAN POTASSIUM 100 MG PO TABS: 100.0000 mg | ORAL_TABLET | Freq: Every day | ORAL | 0 refills | Status: DC

## 2018-10-24 MED ORDER — AMLODIPINE BESYLATE 5 MG PO TABS: 7.5000 mg | ORAL_TABLET | Freq: Every day | ORAL | 0 refills | Status: DC

## 2018-10-24 MED ORDER — METFORMIN HCL 1000 MG PO TABS: 1000.0000 mg | ORAL_TABLET | Freq: Two times a day (BID) | ORAL | 0 refills | Status: DC

## 2018-10-24 MED ORDER — HYDROCHLOROTHIAZIDE 25 MG PO TABS: 25.0000 mg | ORAL_TABLET | Freq: Every day | ORAL | 0 refills | Status: DC

## 2018-10-24 NOTE — Patient Instructions (Addendum)
Continue current medications.  Blood pressure.  If he continues to run high you may need to have a medication change.  Would recommend against increasing the amlodipine because it adds to your swelling.  Your diabetes is good with A1c of 7.5, so I think you continue our satisfactory on the medications you are taking.  If you need something for the arthritis pain, take Tylenol (acetaminophen) 500 mg 2 pills 3 times daily or 650 mg 2 pills twice daily.  Do not take ibuprofen or Aleve because of your kidney function.  Plan to return to see Dr. Neva Seat in 4 to 6 months.    If you have lab work done today you will be contacted with your lab results within the next 2 weeks.  If you have not heard from [place] then please contact us. The fastest way to get your results is to register for My Chart.   IF you received an x-ray today, you will receive an invoice from [facility]. Please contact [facility] at [phone] with questions or concerns regarding your invoice.   IF you received labwork today, you will receive an invoice from [place]. Please contact [facility] at [phone] with questions or concerns regarding your invoice.   Our billing staff will not be able to assist you with questions regarding bills from these companies.  You will be contacted with the lab results as soon as they are available. The fastest way to get your results is to activate your My Chart account. Instructions are located on the last page of this paperwork. If you have not heard from [place] regarding the results in 2 weeks, please contact this office.

## 2018-10-24 NOTE — Progress Notes (Signed)
Patient ID: Jonathan Mcgee, male    DOB: [birth year]  Age: 67 y.o. MRN: [id number]  Chief Complaint  Patient presents with  . Diabetes    follow up asking for 3 month supplies of medication  . Hypertension    Subjective:   Patient of Dr. Rolly Salter who comes in today for a medication refill and recheck.  He does not take his Jardiance due to the cost.  He has increased weakness when he tries to ambulate.  He has the myasthenia and is going to be seeing his doctor in a couple of months.  He has arthritic pains.  He does not check his sugar very often.  Current allergies, medications, problem list, past/family and social histories reviewed.  Objective:  BP (!) 152/75 (BP Location: Left Arm, Patient Position: Sitting, Cuff Size: Large)   Pulse (!) 59   Temp 98.5 F (36.9 C) (Oral)   Ht 5' 9"  (1.753 m)   Wt 220 lb 12.8 oz (100.2 kg)   SpO2 96%   BMI 32.61 kg/m   No major acute distress.  Bilateral olecranon bursal cysts are quite obvious.  Throat clear.  Neck supple without nodes or thyromegaly.  Chest is clear to auscultation.  Heart regular without murmur.  Abdomen soft.  He has 3+ pitting edema on the right foot, 2+ on the left.  Fiber sensitivity testing is normal. Results for orders placed or performed in visit on 10/24/18  POCT glycosylated hemoglobin (Hb A1C)  Result Value Ref Range   Hemoglobin A1C 7.5 (A) 4.0 - 5.6 %   HbA1c POC (<> result, manual entry)     HbA1c, POC (prediabetic range)     HbA1c, POC (controlled diabetic range)      Assessment & Plan:   Assessment: 1. Essential hypertension   2. Type 2 diabetes mellitus with stage 1 chronic kidney disease, without long-term current use of insulin ([place])   3. Hyperlipidemia, unspecified hyperlipidemia type   4. Type 2 diabetes mellitus with chronic kidney disease, without long-term current use of insulin, unspecified CKD stage ([place])       Plan: The blood pressure has been up with a systolic 3 of the last 6 times, but  the rest of the time of the reading is been good.  I decided not to change his blood pressure medication at this time.  It may be that he will need a change and may need to get away from amlodipine due to foot swelling.  The A1c of 7 5 is satisfactory, so I do not think he needs to get the Jardiance.  Return to see Dr. Nyoka Cowden in 4 to 6 months.  Orders Placed This Encounter  Procedures  . TSH  . Lipid panel  . CMP14+EGFR  . POCT glycosylated hemoglobin (Hb A1C)    Meds ordered this encounter  Medications  . amLODipine (NORVASC) 5 MG tablet    Sig: Take 1.5 tablets (7.5 mg total) by mouth daily.    Dispense:  90 tablet    Refill:  0  . atorvastatin (LIPITOR) 20 MG tablet    Sig: Take 1 tablet (20 mg total) by mouth daily.    Dispense:  60 tablet    Refill:  0  . hydrochlorothiazide (HYDRODIURIL) 25 MG tablet    Sig: Take 1 tablet (25 mg total) by mouth daily.    Dispense:  90 tablet    Refill:  0  . losartan (COZAAR) 100 MG tablet    Sig: Take 1  tablet (100 mg total) by mouth daily.    Dispense:  90 tablet    Refill:  0  . metFORMIN (GLUCOPHAGE) 1000 MG tablet    Sig: Take 1 tablet (1,000 mg total) by mouth 2 (two) times daily with a meal.    Dispense:  120 tablet    Refill:  0         Patient Instructions   Continue current medications.  Blood pressure.  If he continues to run high you may need to have a medication change.  Would recommend against increasing the amlodipine because it adds to your swelling.  Your diabetes is good with A1c of 7.5, so I think you continue our satisfactory on the medications you are taking.  If you need something for the arthritis pain, take Tylenol (acetaminophen) 500 mg 2 pills 3 times daily or 650 mg 2 pills twice daily.  Do not take ibuprofen or Aleve because of your kidney function.  Plan to return to see Dr. Carlota Raspberry in 4 to 6 months.    If you have lab work done today you will be contacted with your lab results within the next  2 weeks.  If you have not heard from [place] then please contact us. The fastest way to get your results is to register for My Chart.   IF you received an x-ray today, you will receive an invoice from [facility]. Please contact [facility] at [phone] with questions or concerns regarding your invoice.   IF you received labwork today, you will receive an invoice from [place]. Please contact [facility] at [phone] with questions or concerns regarding your invoice.   Our billing staff will not be able to assist you with questions regarding bills from these companies.  You will be contacted with the lab results as soon as they are available. The fastest way to get your results is to activate your My Chart account. Instructions are located on the last page of this paperwork. If you have not heard from [place] regarding the results in 2 weeks, please contact this office.        No follow-ups on file.   Ruben Reason, MD 10/24/2018

## 2018-10-25 LAB — CMP14+EGFR
A/G RATIO: 1.6 (ref 1.2–2.2)
ALBUMIN: 4.4 g/dL (ref 3.6–4.8)
ALK PHOS: 95 IU/L (ref 39–117)
ALT: 8 IU/L (ref 0–44)
AST: 13 IU/L (ref 0–40)
BILIRUBIN TOTAL: 0.2 mg/dL (ref 0.0–1.2)
BUN / CREAT RATIO: 11 (ref 10–24)
BUN: 17 mg/dL (ref 8–27)
CO2: 19 mmol/L — ABNORMAL LOW (ref 20–29)
Calcium: 9.8 mg/dL (ref 8.6–10.2)
Chloride: 101 mmol/L (ref 96–106)
Creatinine, Ser: 1.48 mg/dL — ABNORMAL HIGH (ref 0.76–1.27)
GFR calc non Af Amer: 48 mL/min/{1.73_m2} — ABNORMAL LOW (ref >59)
GFR, EST AFRICAN AMERICAN: 55 mL/min/{1.73_m2} — AB (ref >59)
Globulin, Total: 2.7 g/dL (ref 1.5–4.5)
Glucose: 188 mg/dL — ABNORMAL HIGH (ref 65–99)
POTASSIUM: 4.6 mmol/L (ref 3.5–5.2)
Sodium: 138 mmol/L (ref 134–144)
TOTAL PROTEIN: 7.1 g/dL (ref 6.0–8.5)

## 2018-10-25 LAB — LIPID PANEL
CHOL/HDL RATIO: 2.7 ratio (ref 0.0–5.0)
Cholesterol, Total: 118 mg/dL (ref 100–199)
HDL: 43 mg/dL (ref >39)
LDL Calculated: 56 mg/dL (ref 0–99)
TRIGLYCERIDES: 94 mg/dL (ref 0–149)
VLDL CHOLESTEROL CAL: 19 mg/dL (ref 5–40)

## 2018-10-25 LAB — TSH: TSH: 3.65 u[IU]/mL (ref 0.450–4.500)

## 2018-11-25 HISTORY — PX: CATARACT EXTRACTION, BILATERAL: SHX1313

## 2019-01-18 ENCOUNTER — Ambulatory Visit: Payer: Medicare PPO | Admitting: Diagnostic Neuroimaging

## 2019-01-18 ENCOUNTER — Encounter: Payer: Self-pay | Admitting: Diagnostic Neuroimaging

## 2019-01-18 VITALS — BP 138/70 | HR 64 | Ht 69.0 in | Wt 218.6 lb

## 2019-01-18 DIAGNOSIS — G7000 Myasthenia gravis without (acute) exacerbation: Secondary | ICD-10-CM | POA: Diagnosis not present

## 2019-01-18 MED ORDER — PYRIDOSTIGMINE BROMIDE 60 MG PO TABS: 60.0000 mg | ORAL_TABLET | Freq: Three times a day (TID) | ORAL | 4 refills | Status: DC

## 2019-01-18 MED ORDER — AZATHIOPRINE 50 MG PO TABS: 50.0000 mg | ORAL_TABLET | Freq: Every day | ORAL | 12 refills | Status: DC

## 2019-01-18 NOTE — Progress Notes (Signed)
[facility]  PATIENT: Jonathan Mcgee DOB: 1951-09-19  REFERRING CLINICIAN: Deliah Boston, PA-c HISTORY FROM: patient  REASON FOR VISIT: follow up     HISTORICAL  CHIEF COMPLAINT:  Chief Complaint  Patient presents with  . Myasthenia gravis    rm 6, "concerns over fatigue, weakness in left leg, balance issues, cramps in LE esp at night; upcoming cataract surgery"  . Follow-up    6 month    HISTORY OF PRESENT ILLNESS:   UPDATE (01/18/19, VRP): Since last visit, doing slightly worse. Mild weakness in left leg. Slightly mild generalized fatigue and weakness. Worse with exertion. Symptoms are worse than 2019. No speech, swallow or breathing issues. Tolerating mestinon.   UPDATE (04/15/18, VRP): Since last visit, doing about the same with MG. Tolerating mestinon. No alleviating or aggravating factors. Now planning to move to [place] for work.  UPDATE 07/16/17: Since last visit, overall stable. Mild generalized fatigue continues. Overall stable. Notices some more muscle strain.   UPDATE 02/12/17: Since last visit, BP is better (meds have been adjusted). Now has CPAP machine (about to start). Sugars are better now (averaging ~150's). Intermittent double vision continues. Mild generalized fatigue, but no focal weakness.   UPDATE 12/31/16: Since last visit, IVIG completed, and general energy little better, but muscle cramps worse. BP running high. He has not checked sugars lately. PSG showed mod-severe OSA, and CPAP titration study is pending. Tolerating mestinon 60mg  BID.   UPDATE 10/09/16: Since last visit, double vision is slightly better on mestinon 60mg  TID. However having more muscle cramps, excess saliva production. Fatigable weakness is persistent.   PRIOR HPI (08/28/16): 68 year old ambidextrous male with hypertension, diabetes, hypercholesterolemia here for evaluation of myasthenia gravis. For past 1-2 years patient has had mild intermittent right-sided ptosis. In  May 2017 he noticed onset of double vision, blurred vision, wavy vision. This is progressively worsened. For past 1 month he has had increasing fatigue with walking. Is having difficulty with upper and lower extremity strength. He denies any speech, swallowing, breathing difficulty. His balance has been slightly off. Patient went to PCP, ophthalmology, had [facility] antibody testing which confirmed diagnosis of myasthenia gravis.   REVIEW OF SYSTEMS: Full 14 system review of systems performed and negative with exception of: weakness shortness of breath back pain blurred vision.    ALLERGIES: Allergies  Allergen Reactions  . Lisinopril Swelling    Facial swelling    HOME MEDICATIONS: Outpatient Medications Prior to Visit  Medication Sig Dispense Refill  . amLODipine (NORVASC) 5 MG tablet Take 1.5 tablets (7.5 mg total) by mouth daily. 90 tablet 0  . atorvastatin (LIPITOR) 20 MG tablet Take 1 tablet (20 mg total) by mouth daily. 60 tablet 0  . hydrochlorothiazide (HYDRODIURIL) 25 MG tablet Take 1 tablet (25 mg total) by mouth daily. 90 tablet 0  . losartan (COZAAR) 100 MG tablet Take 1 tablet (100 mg total) by mouth daily. 90 tablet 0  . metFORMIN (GLUCOPHAGE) 1000 MG tablet Take 1 tablet (1,000 mg total) by mouth 2 (two) times daily with a meal. 120 tablet 0  . pyridostigmine (MESTINON) 60 MG tablet Take 1 tablet (60 mg total) by mouth 2 (two) times daily. 180 tablet 3  . empagliflozin (JARDIANCE) 10 MG TABS tablet Take 10 mg by mouth daily. (Patient not taking: Reported on 01/18/2019) 30 tablet 3   No facility-administered medications prior to visit.     PAST MEDICAL HISTORY: Past Medical History:  Diagnosis Date  . Arthritis   .  Diabetes mellitus without complication (HCC)   . Hypercholesterolemia   . Hypertension   . Myasthenia gravis (HCC)   . Rheumatoid arthritis (HCC)     PAST SURGICAL HISTORY: Past Surgical History:  Procedure Laterality Date  . KNEE SURGERY Right     fractured patella > 20 yrs ago  . PENILE PROSTHESIS IMPLANT  2015    FAMILY HISTORY: Family History  Problem Relation Age of Onset  . Diabetes Mother   . Heart disease Mother   . Hyperlipidemia Mother   . Heart attack Mother   . Hyperlipidemia Father   . Kidney failure Father   . Drug abuse Maternal Grandmother   . Heart disease Maternal Grandmother   . Hypertension Maternal Grandmother   . Hyperlipidemia Maternal Grandmother   . Diabetes Maternal Grandmother     SOCIAL HISTORY:  Social History   Socioeconomic History  . Marital status: Single    Spouse name: Not on file  . Number of children: 1  . Years of education: 2918  . Highest education level: Not on file  Occupational History    Comment: [employer]  Social Needs  . Financial resource strain: Not on file  . Food insecurity:    Worry: Not on file    Inability: Not on file  . Transportation needs:    Medical: Not on file    Non-medical: Not on file  Tobacco Use  . Smoking status: Current Some Day Smoker    Packs/day: 1.00    Types: Cigars  . Smokeless tobacco: Never Used  Substance and Sexual Activity  . Alcohol use: Yes    Comment: occas  . Drug use: No  . Sexual activity: Not on file  Lifestyle  . Physical activity:    Days per week: Not on file    Minutes per session: Not on file  . Stress: Not on file  Relationships  . Social connections:    Talks on phone: Not on file    Gets together: Not on file    Attends religious service: Not on file    Active member of club or organization: Not on file    Attends meetings of clubs or organizations: Not on file    Relationship status: Not on file  . Intimate partner violence:    Fear of current or ex partner: Not on file    Emotionally abused: Not on file    Physically abused: Not on file    Forced sexual activity: Not on file  Other Topics Concern  . Not on file  Social History Narrative   Drinks 2-3 caffeine drinks a day      PHYSICAL EXAM  GENERAL  EXAM/CONSTITUTIONAL: Vitals:  Vitals:   01/18/19 1405  BP: 138/70  Pulse: 64  Weight: 218 lb 9.6 oz (99.2 kg)  Height: 5\' 9"  (1.753 m)    Wt Readings from Last 3 Encounters:  01/18/19 218 lb 9.6 oz (99.2 kg)  10/24/18 220 lb 12.8 oz (100.2 kg)  04/15/18 218 lb (98.9 kg)   Body mass index is 32.28 kg/m. No exam data present  Patient is in no distress; well developed, nourished and groomed; neck is supple  CARDIOVASCULAR:  Examination of carotid arteries is normal; no carotid bruits  Regular rate and rhythm, no murmurs  Examination of peripheral vascular system by observation and palpation is normal  EYES:  Ophthalmoscopic exam of optic discs and posterior segments is normal  MUSCULOSKELETAL:  Gait, strength, tone, movements noted in Neurologic exam  below  NEUROLOGIC: MENTAL STATUS:  No flowsheet data found.  awake, alert, oriented to person, place and time  recent and remote memory intact  normal attention and concentration  language fluent, comprehension intact, naming intact,   fund of knowledge appropriate  CRANIAL NERVE:   2nd - no papilledema on fundoscopic exam  2nd, 3rd, 4th, 6th - pupils equal and reactive to light, visual fields full to confrontation, extraocular muscles --> SUBJECTIVE DOUBLE VISION ON RIGHT, LEFT AND UP GAZE. NORMAL EYE MOVEMENTS; no nystagmus  5th - facial sensation symmetric  7th - facial strength symmetric  8th - hearing intact  9th - palate elevates symmetrically, uvula midline  11th - shoulder shrug symmetric  12th - tongue protrusion midline  MOTOR:   normal bulk and tone, full strength in the BUE, BLE; EXCEPT LEFT SHOULDER PAIN AND WEAKNESS; LEFT HIP FLEX AND DF WEAKNESS (4/5)  SENSORY:   normal and symmetric to light touch, temperature, vibration  COORDINATION:   finger-nose-finger, fine finger movements normal  REFLEXES:   deep tendon reflexes TRACE and symmetric; ABSENT AT KNEES AND  ANKLES  GAIT/STATION:   narrow based gait    DIAGNOSTIC DATA (LABS, IMAGING, TESTING) - I reviewed patient records, labs, notes, testing and imaging myself where available.  Lab Results  Component Value Date   WBC 7.6 12/31/2016   HGB 12.4 (L) 12/31/2016   HCT 36.3 (L) 12/31/2016   MCV 96 12/31/2016   PLT 231 12/31/2016      Component Value Date/Time   NA 138 10/24/2018 1202   K 4.6 10/24/2018 1202   CL 101 10/24/2018 1202   CO2 19 (L) 10/24/2018 1202   GLUCOSE 188 (H) 10/24/2018 1202   GLUCOSE 115 (H) 06/29/2016 1626   BUN 17 10/24/2018 1202   CREATININE 1.48 (H) 10/24/2018 1202   CREATININE 1.39 (H) 06/29/2016 1626   CALCIUM 9.8 10/24/2018 1202   PROT 7.1 10/24/2018 1202   ALBUMIN 4.4 10/24/2018 1202   AST 13 10/24/2018 1202   ALT 8 10/24/2018 1202   ALKPHOS 95 10/24/2018 1202   BILITOT 0.2 10/24/2018 1202   GFRNONAA 48 (L) 10/24/2018 1202   GFRNONAA 53 (L) 06/29/2016 1626   GFRAA 55 (L) 10/24/2018 1202   GFRAA 61 06/29/2016 1626   Lab Results  Component Value Date   CHOL 118 10/24/2018   HDL 43 10/24/2018   LDLCALC 56 10/24/2018   TRIG 94 10/24/2018   CHOLHDL 2.7 10/24/2018   Hemoglobin A1C  Date Value Ref Range Status  10/24/2018 7.5 (A) 4.0 - 5.6 % Final  03/24/2018 8.9  Final   Hgb A1c MFr Bld  Date Value Ref Range Status  07/19/2017 6.5 (H) 4.8 - 5.6 % Final    Comment:             Prediabetes: 5.7 - 6.4          Diabetes: >6.4          Glycemic control for adults with diabetes: <7.0   04/12/2017 7.6 (H) 4.8 - 5.6 % Final    Comment:             Pre-diabetes: 5.7 - 6.4          Diabetes: >6.4          Glycemic control for adults with diabetes: <7.0   12/31/2016 6.4 (H) 4.8 - 5.6 % Final    Comment:             Pre-diabetes: 5.7 - 6.4  Diabetes: >6.4          Glycemic control for adults with diabetes: <7.0     No results found for: VITAMINB12 Lab Results  Component Value Date   TSH 3.650 10/24/2018    07/04/16 CT head [I  reviewed images myself and agree with interpretation. -VRP]  - Mild microvascular disease without acute intracranial process.  09/04/16 CT chest  1. No mediastinal mass or adenopathy. 2. No infiltrate or pulmonary edema. 3. Degenerative changes thoracic spine. 4. Atherosclerotic calcifications of thoracic aorta. 5. Mild fatty infiltration of the liver.  12/31/16 LABS TPMT Activity: Units/mL RBC 31.8   Comment: Reference Range:  Normal: 15.1 - 26.4  Heterozygous for low TPMT variant: 6.3 - 15.0  Homozygous for low TPMT variant: <6.3        ASSESSMENT AND PLAN  68 y.o. year old male here with new onset diagnosis of myasthenia gravis with ocular and generalized features. Was having side effects from mestinon, but also some improvement in ocular symptoms. IVIG has helped generalized symptoms.    Dx: (ocular + generalized) myasthenia gravis  1. Myasthenia gravis (HCC)      PLAN:   MYASTHENIA GRAVIS - continue pyridostigmine 60mg  twice a day  - generalized myasthenia gravis symptoms are worsening; considered prednisone 60mg  daily for longer term treatment of myasthenia gravis but caution due to living at a distance ([place]) and diabetes; will start steroid sparing therapy imuran instead --> imuran 50mg  daily x 2 weeks then 100mg  daily x 2 weeks; then 150mg  daily  - continue BP and DM control  - continue CPAP usage  Meds ordered this encounter  Medications  . azaTHIOprine (IMURAN) 50 MG tablet    Sig: Take 1 tablet (50 mg total) by mouth daily.    Dispense:  30 tablet    Refill:  12  . pyridostigmine (MESTINON) 60 MG tablet    Sig: Take 1 tablet (60 mg total) by mouth 3 (three) times daily.    Dispense:  180 tablet    Refill:  4   Return in about 6 months (around 07/19/2019).     Suanne MarkerVIKRAM R. PENUMALLI, MD 01/18/2019, 2:21 PM Certified in Neurology, Neurophysiology and Neuroimaging  [facility] [address], [address] [place], [place] [address] [phone]  [phone]

## 2019-01-18 NOTE — Patient Instructions (Addendum)
  MYASTHENIA GRAVIS  - start imuran 50mg  daily x 2 weeks then 100mg  daily x 2 weeks; then 150mg  daily  - continue pyridostigmine 60mg  twice a day; may increase to 60mg  three times a day

## 2019-01-19 DIAGNOSIS — H2513 Age-related nuclear cataract, bilateral: Secondary | ICD-10-CM | POA: Diagnosis not present

## 2019-01-19 DIAGNOSIS — H25011 Cortical age-related cataract, right eye: Secondary | ICD-10-CM | POA: Diagnosis not present

## 2019-01-19 DIAGNOSIS — E119 Type 2 diabetes mellitus without complications: Secondary | ICD-10-CM | POA: Diagnosis not present

## 2019-01-19 DIAGNOSIS — H2511 Age-related nuclear cataract, right eye: Secondary | ICD-10-CM | POA: Diagnosis not present

## 2019-01-19 DIAGNOSIS — H532 Diplopia: Secondary | ICD-10-CM | POA: Diagnosis not present

## 2019-01-19 DIAGNOSIS — H35033 Hypertensive retinopathy, bilateral: Secondary | ICD-10-CM | POA: Diagnosis not present

## 2019-01-19 DIAGNOSIS — H25013 Cortical age-related cataract, bilateral: Secondary | ICD-10-CM | POA: Diagnosis not present

## 2019-01-19 LAB — HM DIABETES EYE EXAM

## 2019-01-27 DIAGNOSIS — H2181 Floppy iris syndrome: Secondary | ICD-10-CM | POA: Diagnosis not present

## 2019-01-27 DIAGNOSIS — H2511 Age-related nuclear cataract, right eye: Secondary | ICD-10-CM | POA: Diagnosis not present

## 2019-01-27 DIAGNOSIS — H5703 Miosis: Secondary | ICD-10-CM | POA: Diagnosis not present

## 2019-01-27 DIAGNOSIS — H25811 Combined forms of age-related cataract, right eye: Secondary | ICD-10-CM | POA: Diagnosis not present

## 2019-02-02 DIAGNOSIS — H25012 Cortical age-related cataract, left eye: Secondary | ICD-10-CM | POA: Diagnosis not present

## 2019-02-02 DIAGNOSIS — H2512 Age-related nuclear cataract, left eye: Secondary | ICD-10-CM | POA: Diagnosis not present

## 2019-02-03 ENCOUNTER — Other Ambulatory Visit: Payer: Self-pay | Admitting: Family Medicine

## 2019-02-03 DIAGNOSIS — N181 Chronic kidney disease, stage 1: Secondary | ICD-10-CM

## 2019-02-03 DIAGNOSIS — I10 Essential (primary) hypertension: Secondary | ICD-10-CM

## 2019-02-03 DIAGNOSIS — E785 Hyperlipidemia, unspecified: Secondary | ICD-10-CM

## 2019-02-03 DIAGNOSIS — E1122 Type 2 diabetes mellitus with diabetic chronic kidney disease: Secondary | ICD-10-CM

## 2019-02-03 NOTE — Telephone Encounter (Signed)
Patient saw Dr Jonathan Mcgee and had labs completed- will be seeing Dr Jonathan Mcgee 02/15/19 for follow up. 30 day supply of medications given- Dr Jonathan Mcgee to review and refill as he wants to manage patient medication. Requested Prescriptions  Pending Prescriptions Disp Refills  . amLODipine (NORVASC) 5 MG tablet [Pharmacy Med Name: amLODIPine Besylate 5 MG Oral Tablet] 45 tablet 0    Sig: TAKE 1 & 1/2 (ONE & ONE-HALF) TABLETS BY MOUTH ONCE DAILY     Cardiovascular:  Calcium Channel Blockers Passed - 02/03/2019  2:17 PM      Passed - Last BP in normal range    BP Readings from Last 1 Encounters:  01/18/19 138/70         Passed - Valid encounter within last 6 months    Recent Outpatient Visits          3 months ago Essential hypertension   Primary Care at [facility], Fenton Malling, MD   10 months ago Type 2 diabetes mellitus with stage 1 chronic kidney disease, without long-term current use of insulin [facility])   Primary Care at [facility], Ranell Patrick, MD   1 year ago Essential hypertension   Primary Care at [facility], Arlie Solomons, MD   1 year ago Essential hypertension   Primary Care at [facility], Ranell Patrick, MD   1 year ago Type 2 diabetes mellitus with chronic kidney disease, without long-term current use of insulin, unspecified CKD stage [facility])   Primary Care at [facility], Ranell Patrick, MD      Future Appointments            In 1 week Jonathan Agreste, MD Primary Care at [place], [facility]         . hydrochlorothiazide (HYDRODIURIL) 25 MG tablet [Pharmacy Med Name: hydroCHLOROthiazide 25 MG Oral Tablet] 30 tablet 0    Sig: Take 1 tablet by mouth once daily     Cardiovascular: Diuretics - Thiazide Failed - 02/03/2019  2:17 PM      Failed - Cr in normal range and within 360 days    Creat  Date Value Ref Range Status  06/29/2016 1.39 (H) 0.70 - 1.25 mg/dL Final    Comment:      For patients > or = 69 years of age: The upper reference limit for Creatinine is approximately 13% higher for people  identified as African-American.      Creatinine, Ser  Date Value Ref Range Status  10/24/2018 1.48 (H) 0.76 - 1.27 mg/dL Final         Passed - Ca in normal range and within 360 days    Calcium  Date Value Ref Range Status  10/24/2018 9.8 8.6 - 10.2 mg/dL Final         Passed - K in normal range and within 360 days    Potassium  Date Value Ref Range Status  10/24/2018 4.6 3.5 - 5.2 mmol/L Final         Passed - Na in normal range and within 360 days    Sodium  Date Value Ref Range Status  10/24/2018 138 134 - 144 mmol/L Final         Passed - Last BP in normal range    BP Readings from Last 1 Encounters:  01/18/19 138/70         Passed - Valid encounter within last 6 months    Recent Outpatient Visits          3 months  ago Essential hypertension   Primary Care at [facility], Fenton Malling, MD   10 months ago Type 2 diabetes mellitus with stage 1 chronic kidney disease, without long-term current use of insulin [facility])   Primary Care at [facility], Ranell Patrick, MD   1 year ago Essential hypertension   Primary Care at [facility], Arlie Solomons, MD   1 year ago Essential hypertension   Primary Care at [facility], Ranell Patrick, MD   1 year ago Type 2 diabetes mellitus with chronic kidney disease, without long-term current use of insulin, unspecified CKD stage [facility])   Primary Care at [facility], Ranell Patrick, MD      Future Appointments            In 1 week Jonathan Agreste, MD Primary Care at [place], [facility]         . metFORMIN (GLUCOPHAGE) 1000 MG tablet [Pharmacy Med Name: metFORMIN HCl 1000 MG Oral Tablet] 60 tablet 0    Sig: TAKE 1 TABLET BY MOUTH TWICE DAILY WITH MEALS     Endocrinology:  Diabetes - Biguanides Failed - 02/03/2019  2:17 PM      Failed - Cr in normal range and within 360 days    Creat  Date Value Ref Range Status  06/29/2016 1.39 (H) 0.70 - 1.25 mg/dL Final    Comment:      For patients > or = 69 years of age: The upper reference limit  for Creatinine is approximately 13% higher for people identified as African-American.      Creatinine, Ser  Date Value Ref Range Status  10/24/2018 1.48 (H) 0.76 - 1.27 mg/dL Final         Failed - eGFR in normal range and within 360 days    GFR, Est African American  Date Value Ref Range Status  06/29/2016 61 >=60 mL/min Final   GFR calc Af Amer  Date Value Ref Range Status  10/24/2018 55 (L) >59 mL/min/1.73 Final   GFR, Est Non African American  Date Value Ref Range Status  06/29/2016 53 (L) >=60 mL/min Final   GFR calc non Af Amer  Date Value Ref Range Status  10/24/2018 48 (L) >59 mL/min/1.73 Final         Passed - HBA1C is between 0 and 7.9 and within 180 days    Hemoglobin A1C  Date Value Ref Range Status  10/24/2018 7.5 (A) 4.0 - 5.6 % Final   Hgb A1c MFr Bld  Date Value Ref Range Status  07/19/2017 6.5 (H) 4.8 - 5.6 % Final    Comment:             Prediabetes: 5.7 - 6.4          Diabetes: >6.4          Glycemic control for adults with diabetes: <7.0          Passed - Valid encounter within last 6 months    Recent Outpatient Visits          3 months ago Essential hypertension   Primary Care at [facility], Fenton Malling, MD   10 months ago Type 2 diabetes mellitus with stage 1 chronic kidney disease, without long-term current use of insulin [facility])   Primary Care at [facility], Ranell Patrick, MD   1 year ago Essential hypertension   Primary Care at [facility], Arlie Solomons, MD   1 year ago Essential hypertension   Primary Care at [facility],  Ranell Patrick, MD   1 year ago Type 2 diabetes mellitus with chronic kidney disease, without long-term current use of insulin, unspecified CKD stage [facility])   Primary Care at [facility], Ranell Patrick, MD      Future Appointments            In 1 week Jonathan Mcgee Ranell Patrick, MD Primary Care at [place], [facility]         . atorvastatin (LIPITOR) 20 MG tablet [Pharmacy Med Name: Atorvastatin Calcium 20 MG Oral Tablet] 30 tablet 0     Sig: Take 1 tablet by mouth once daily     Cardiovascular:  Antilipid - Statins Passed - 02/03/2019  2:17 PM      Passed - Total Cholesterol in normal range and within 360 days    Cholesterol, Total  Date Value Ref Range Status  10/24/2018 118 100 - 199 mg/dL Final         Passed - LDL in normal range and within 360 days    LDL Calculated  Date Value Ref Range Status  10/24/2018 56 0 - 99 mg/dL Final         Passed - HDL in normal range and within 360 days    HDL  Date Value Ref Range Status  10/24/2018 43 >39 mg/dL Final         Passed - Triglycerides in normal range and within 360 days    Triglycerides  Date Value Ref Range Status  10/24/2018 94 0 - 149 mg/dL Final         Passed - Patient is not pregnant      Passed - Valid encounter within last 12 months    Recent Outpatient Visits          3 months ago Essential hypertension   Primary Care at [facility], Fenton Malling, MD   10 months ago Type 2 diabetes mellitus with stage 1 chronic kidney disease, without long-term current use of insulin [facility])   Primary Care at [facility], Ranell Patrick, MD   1 year ago Essential hypertension   Primary Care at [facility], Arlie Solomons, MD   1 year ago Essential hypertension   Primary Care at [facility], Ranell Patrick, MD   1 year ago Type 2 diabetes mellitus with chronic kidney disease, without long-term current use of insulin, unspecified CKD stage [facility])   Primary Care at [facility], Ranell Patrick, MD      Future Appointments            In 1 week Jonathan Mcgee Ranell Patrick, MD Primary Care at [place], [facility]

## 2019-02-12 ENCOUNTER — Telehealth: Payer: Self-pay | Admitting: Family Medicine

## 2019-02-12 NOTE — Telephone Encounter (Signed)
02/12/2019 - PATIENT HAD AN APPOINTMENT TO SEE DR. GREENE ON Monday 02/15/2019 AT 9:00am. PER JASON PATIENT NEEDS TO BE RESCHEDULED. I CALLED AN LEFT MR. Jonathan Mcgee VOICE MAIL ASKING HIM TO CALL THE OFFICE BACK. I WAS TRYING TO SEE IF HE COULD COME IN ON Tuesday 02/16/2019 AT 9:00am TO SEE DR. GREENE.  MBC

## 2019-02-15 ENCOUNTER — Ambulatory Visit: Payer: Medicare PPO | Admitting: Family Medicine

## 2019-02-15 ENCOUNTER — Telehealth: Payer: Self-pay | Admitting: Family Medicine

## 2019-02-15 NOTE — Telephone Encounter (Signed)
02/15/2019 - I LEFT A VOICE MAIL TELLING PATIENT THAT HIS OFFICE VISIT WITH DR. Neva Seat ON Friday 02/19/2019 AT 10:20am WILL BE BY TELEMED AND THAT HE DOES NOT HAVE TO COME INTO THE OFFICE. HE NEEDS TO BE CLOSE BY THE PHONE SO THAT HE WILL HEAR DR. Neva Seat WHEN HE CALLS. MBC

## 2019-02-19 ENCOUNTER — Telehealth (INDEPENDENT_AMBULATORY_CARE_PROVIDER_SITE_OTHER): Payer: BLUE CROSS/BLUE SHIELD | Admitting: Family Medicine

## 2019-02-19 ENCOUNTER — Ambulatory Visit (INDEPENDENT_AMBULATORY_CARE_PROVIDER_SITE_OTHER): Payer: BLUE CROSS/BLUE SHIELD | Admitting: Family Medicine

## 2019-02-19 ENCOUNTER — Emergency Department (HOSPITAL_BASED_OUTPATIENT_CLINIC_OR_DEPARTMENT_OTHER): Payer: BC Managed Care – PPO

## 2019-02-19 ENCOUNTER — Encounter: Payer: Self-pay | Admitting: Family Medicine

## 2019-02-19 ENCOUNTER — Inpatient Hospital Stay (HOSPITAL_BASED_OUTPATIENT_CLINIC_OR_DEPARTMENT_OTHER)
Admission: EM | Admit: 2019-02-19 | Discharge: 2019-02-21 | DRG: 871 | Disposition: A | Discharge status: Home / Self Care | Payer: BC Managed Care – PPO | Attending: Family Medicine | Admitting: Family Medicine

## 2019-02-19 ENCOUNTER — Other Ambulatory Visit: Payer: Self-pay

## 2019-02-19 ENCOUNTER — Encounter (HOSPITAL_BASED_OUTPATIENT_CLINIC_OR_DEPARTMENT_OTHER): Payer: Self-pay

## 2019-02-19 VITALS — BP 119/68 | HR 95 | Temp 99.8°F | Resp 16 | Wt 220.0 lb

## 2019-02-19 DIAGNOSIS — N181 Chronic kidney disease, stage 1: Secondary | ICD-10-CM

## 2019-02-19 DIAGNOSIS — M0579 Rheumatoid arthritis with rheumatoid factor of multiple sites without organ or systems involvement: Secondary | ICD-10-CM

## 2019-02-19 DIAGNOSIS — N183 Chronic kidney disease, stage 3 (moderate): Secondary | ICD-10-CM | POA: Diagnosis present

## 2019-02-19 DIAGNOSIS — E1122 Type 2 diabetes mellitus with diabetic chronic kidney disease: Secondary | ICD-10-CM | POA: Diagnosis not present

## 2019-02-19 DIAGNOSIS — G7000 Myasthenia gravis without (acute) exacerbation: Secondary | ICD-10-CM

## 2019-02-19 DIAGNOSIS — I10 Essential (primary) hypertension: Secondary | ICD-10-CM | POA: Diagnosis not present

## 2019-02-19 DIAGNOSIS — R6889 Other general symptoms and signs: Secondary | ICD-10-CM | POA: Diagnosis not present

## 2019-02-19 DIAGNOSIS — E785 Hyperlipidemia, unspecified: Secondary | ICD-10-CM | POA: Diagnosis not present

## 2019-02-19 DIAGNOSIS — Z20828 Contact with and (suspected) exposure to other viral communicable diseases: Secondary | ICD-10-CM | POA: Diagnosis not present

## 2019-02-19 DIAGNOSIS — G7001 Myasthenia gravis with (acute) exacerbation: Secondary | ICD-10-CM | POA: Diagnosis not present

## 2019-02-19 DIAGNOSIS — E119 Type 2 diabetes mellitus without complications: Secondary | ICD-10-CM | POA: Diagnosis not present

## 2019-02-19 DIAGNOSIS — Z8349 Family history of other endocrine, nutritional and metabolic diseases: Secondary | ICD-10-CM

## 2019-02-19 DIAGNOSIS — E7800 Pure hypercholesterolemia, unspecified: Secondary | ICD-10-CM | POA: Diagnosis present

## 2019-02-19 DIAGNOSIS — R251 Tremor, unspecified: Secondary | ICD-10-CM

## 2019-02-19 DIAGNOSIS — M2550 Pain in unspecified joint: Secondary | ICD-10-CM | POA: Diagnosis not present

## 2019-02-19 DIAGNOSIS — M7989 Other specified soft tissue disorders: Secondary | ICD-10-CM | POA: Diagnosis not present

## 2019-02-19 DIAGNOSIS — Z841 Family history of disorders of kidney and ureter: Secondary | ICD-10-CM

## 2019-02-19 DIAGNOSIS — F17290 Nicotine dependence, other tobacco product, uncomplicated: Secondary | ICD-10-CM | POA: Diagnosis present

## 2019-02-19 DIAGNOSIS — Z79899 Other long term (current) drug therapy: Secondary | ICD-10-CM | POA: Diagnosis not present

## 2019-02-19 DIAGNOSIS — D631 Anemia in chronic kidney disease: Secondary | ICD-10-CM | POA: Diagnosis present

## 2019-02-19 DIAGNOSIS — Z7984 Long term (current) use of oral hypoglycemic drugs: Secondary | ICD-10-CM | POA: Diagnosis not present

## 2019-02-19 DIAGNOSIS — R509 Fever, unspecified: Secondary | ICD-10-CM | POA: Diagnosis not present

## 2019-02-19 DIAGNOSIS — D72810 Lymphocytopenia: Secondary | ICD-10-CM | POA: Diagnosis present

## 2019-02-19 DIAGNOSIS — Z888 Allergy status to other drugs, medicaments and biological substances status: Secondary | ICD-10-CM | POA: Diagnosis not present

## 2019-02-19 DIAGNOSIS — N390 Urinary tract infection, site not specified: Secondary | ICD-10-CM

## 2019-02-19 DIAGNOSIS — Z8249 Family history of ischemic heart disease and other diseases of the circulatory system: Secondary | ICD-10-CM

## 2019-02-19 DIAGNOSIS — A419 Sepsis, unspecified organism: Secondary | ICD-10-CM | POA: Diagnosis not present

## 2019-02-19 DIAGNOSIS — H532 Diplopia: Secondary | ICD-10-CM | POA: Diagnosis present

## 2019-02-19 DIAGNOSIS — R531 Weakness: Secondary | ICD-10-CM | POA: Diagnosis not present

## 2019-02-19 DIAGNOSIS — G8929 Other chronic pain: Secondary | ICD-10-CM | POA: Diagnosis not present

## 2019-02-19 DIAGNOSIS — Z20822 Contact with and (suspected) exposure to covid-19: Secondary | ICD-10-CM

## 2019-02-19 DIAGNOSIS — M25569 Pain in unspecified knee: Secondary | ICD-10-CM

## 2019-02-19 DIAGNOSIS — M069 Rheumatoid arthritis, unspecified: Secondary | ICD-10-CM | POA: Diagnosis present

## 2019-02-19 DIAGNOSIS — I129 Hypertensive chronic kidney disease with stage 1 through stage 4 chronic kidney disease, or unspecified chronic kidney disease: Secondary | ICD-10-CM | POA: Diagnosis not present

## 2019-02-19 DIAGNOSIS — Z833 Family history of diabetes mellitus: Secondary | ICD-10-CM

## 2019-02-19 LAB — URINALYSIS, ROUTINE W REFLEX MICROSCOPIC
BILIRUBIN URINE: NEGATIVE
Glucose, UA: NEGATIVE mg/dL
Ketones, ur: NEGATIVE mg/dL
NITRITE: NEGATIVE
PROTEIN: 30 mg/dL — AB
Specific Gravity, Urine: 1.02 (ref 1.005–1.030)
pH: 5.5 (ref 5.0–8.0)

## 2019-02-19 LAB — COMPREHENSIVE METABOLIC PANEL
ALT: 15 U/L (ref 0–44)
AST: 17 U/L (ref 15–41)
Albumin: 4.1 g/dL (ref 3.5–5.0)
Alkaline Phosphatase: 79 U/L (ref 38–126)
Anion gap: 11 (ref 5–15)
BUN: 26 mg/dL — ABNORMAL HIGH (ref 8–23)
CO2: 25 mmol/L (ref 22–32)
Calcium: 9 mg/dL (ref 8.9–10.3)
Chloride: 99 mmol/L (ref 98–111)
Creatinine, Ser: 1.55 mg/dL — ABNORMAL HIGH (ref 0.61–1.24)
GFR calc Af Amer: 53 mL/min — ABNORMAL LOW (ref >60)
GFR calc non Af Amer: 45 mL/min — ABNORMAL LOW (ref >60)
Glucose, Bld: 192 mg/dL — ABNORMAL HIGH (ref 70–99)
Potassium: 3.6 mmol/L (ref 3.5–5.1)
SODIUM: 135 mmol/L (ref 135–145)
Total Bilirubin: 0.9 mg/dL (ref 0.3–1.2)
Total Protein: 8.4 g/dL — ABNORMAL HIGH (ref 6.5–8.1)

## 2019-02-19 LAB — POCT CBC
Granulocyte percent: 84.6 %G — AB (ref 37–80)
HCT, POC: 36.2 % (ref 29–41)
Hemoglobin: 12.4 g/dL (ref 11–14.6)
Lymph, poc: 1.1 (ref 0.6–3.4)
MCH, POC: 32.6 pg — AB (ref 27–31.2)
MCHC: 34.2 g/dL (ref 31.8–35.4)
MCV: 95.3 fL (ref 76–111)
MID (cbc): 0.7 (ref 0–0.9)
MPV: 6.7 fL (ref 0–99.8)
POC Granulocyte: 9.7 — AB (ref 2–6.9)
POC LYMPH PERCENT: 9.6 %L — AB (ref 10–50)
POC MID %: 5.8 %M (ref 0–12)
Platelet Count, POC: 244 10*3/uL (ref 142–424)
RBC: 3.8 M/uL — AB (ref 4.69–6.13)
RDW, POC: 12.6 %
WBC: 11.5 10*3/uL — AB (ref 4.6–10.2)

## 2019-02-19 LAB — CBC WITH DIFFERENTIAL/PLATELET
Abs Immature Granulocytes: 0.04 10*3/uL (ref 0.00–0.07)
BASOS PCT: 0 %
Basophils Absolute: 0 10*3/uL (ref 0.0–0.1)
Eosinophils Absolute: 0.1 10*3/uL (ref 0.0–0.5)
Eosinophils Relative: 1 %
HCT: 36.5 % — ABNORMAL LOW (ref 39.0–52.0)
Hemoglobin: 12.3 g/dL — ABNORMAL LOW (ref 13.0–17.0)
Immature Granulocytes: 0 %
Lymphocytes Relative: 2 %
Lymphs Abs: 0.3 10*3/uL — ABNORMAL LOW (ref 0.7–4.0)
MCH: 32.3 pg (ref 26.0–34.0)
MCHC: 33.7 g/dL (ref 30.0–36.0)
MCV: 95.8 fL (ref 80.0–100.0)
Monocytes Absolute: 1.1 10*3/uL — ABNORMAL HIGH (ref 0.1–1.0)
Monocytes Relative: 8 %
Neutro Abs: 11.6 10*3/uL — ABNORMAL HIGH (ref 1.7–7.7)
Neutrophils Relative %: 89 %
Platelets: 202 10*3/uL (ref 150–400)
RBC: 3.81 MIL/uL — AB (ref 4.22–5.81)
RDW: 11.9 % (ref 11.5–15.5)
WBC: 13.1 10*3/uL — AB (ref 4.0–10.5)
nRBC: 0 % (ref 0.0–0.2)

## 2019-02-19 LAB — URINALYSIS, MICROSCOPIC (REFLEX)

## 2019-02-19 LAB — PROCALCITONIN: Procalcitonin: 0.18 ng/mL

## 2019-02-19 LAB — CBG MONITORING, ED: Glucose-Capillary: 174 mg/dL — ABNORMAL HIGH (ref 70–99)

## 2019-02-19 LAB — LACTIC ACID, PLASMA: Lactic Acid, Venous: 2 mmol/L (ref 0.5–1.9)

## 2019-02-19 MED ORDER — CEFEPIME HCL 2 G IJ SOLR
INTRAMUSCULAR | Status: AC
  Filled 2019-02-19: qty 2

## 2019-02-19 MED ORDER — SODIUM CHLORIDE 0.9 % IV SOLN: INTRAVENOUS | Status: DC | PRN

## 2019-02-19 MED ORDER — SODIUM CHLORIDE 0.9 % IV SOLN
2.0000 g | Freq: Once | INTRAVENOUS | Status: AC
  Administered 2019-02-19: 2 g via INTRAVENOUS
  Filled 2019-02-19: qty 2

## 2019-02-19 MED ORDER — AMLODIPINE BESYLATE 5 MG PO TABS: ORAL_TABLET | ORAL | 1 refills | Status: DC

## 2019-02-19 MED ORDER — VANCOMYCIN HCL IN DEXTROSE 1-5 GM/200ML-% IV SOLN
INTRAVENOUS | Status: AC
  Filled 2019-02-19: qty 400

## 2019-02-19 MED ORDER — ACETAMINOPHEN 500 MG PO TABS
1000.0000 mg | ORAL_TABLET | Freq: Once | ORAL | Status: AC
  Administered 2019-02-19: 1000 mg via ORAL
  Filled 2019-02-19: qty 2

## 2019-02-19 MED ORDER — SODIUM CHLORIDE 0.9 % IV SOLN
2.0000 g | Freq: Two times a day (BID) | INTRAVENOUS | Status: DC
  Administered 2019-02-20: 2 g via INTRAVENOUS
  Filled 2019-02-19 (×2): qty 2

## 2019-02-19 MED ORDER — HYDROCHLOROTHIAZIDE 25 MG PO TABS: 25.0000 mg | ORAL_TABLET | Freq: Every day | ORAL | 1 refills | Status: DC

## 2019-02-19 MED ORDER — METFORMIN HCL 1000 MG PO TABS: 1000.0000 mg | ORAL_TABLET | Freq: Two times a day (BID) | ORAL | 1 refills | Status: DC

## 2019-02-19 MED ORDER — ATORVASTATIN CALCIUM 20 MG PO TABS: 20.0000 mg | ORAL_TABLET | Freq: Every day | ORAL | 1 refills | Status: DC

## 2019-02-19 MED ORDER — VANCOMYCIN HCL 10 G IV SOLR
2000.0000 mg | Freq: Once | INTRAVENOUS | Status: AC
  Administered 2019-02-19: 2000 mg via INTRAVENOUS
  Filled 2019-02-19: qty 2000

## 2019-02-19 MED ORDER — VANCOMYCIN HCL 10 G IV SOLR
1500.0000 mg | INTRAVENOUS | Status: DC
  Filled 2019-02-19: qty 1500

## 2019-02-19 MED ORDER — SODIUM CHLORIDE 0.9 % IV SOLN
INTRAVENOUS | Status: DC | PRN
  Administered 2019-02-19: 250 mL via INTRAVENOUS

## 2019-02-19 MED ORDER — SODIUM CHLORIDE 0.9 % IV BOLUS
30.0000 mL/kg | Freq: Once | INTRAVENOUS | Status: AC
  Administered 2019-02-19: 3000 mL via INTRAVENOUS

## 2019-02-19 MED ORDER — LOSARTAN POTASSIUM 100 MG PO TABS: 100.0000 mg | ORAL_TABLET | Freq: Every day | ORAL | 1 refills | Status: DC

## 2019-02-19 NOTE — ED Notes (Signed)
Attempted x 1 to obtain additional IV; unsuccessful; pt requests to not be stuck again at this time; EDP notified

## 2019-02-19 NOTE — Progress Notes (Addendum)
Pharmacy Antibiotic Note  Jonathan Mcgee is a 68 y.o. male admitted on 02/19/2019 with sepsis.  Pharmacy has been consulted for vancomycin and cefepime dosing. On admission patient's Tmax 102, elevated WBC of 13.1, elevated LA of 2, and Scr of 1.55.  Plan: Cefepime 2g IV q12h Vancomycin 2000 mg IV x 1, then 1500 mg IV q24h (expected AUC 505, Cmax ss 33.2, Cmin ss 13.1 using Scr of 1.55) Obtain vancomycin peak and trough at steady state Monitor Scr, CBC, and clinical progress F/u C&S, de-escalation plans, and LOT  Height: 5\' 10"  (177.8 cm) Weight: 220 lb (99.8 kg) IBW/kg (Calculated) : 73  Temp (24hrs), Avg:100.7 F (38.2 C), Min:99.8 F (37.7 C), Max:102 F (38.9 C)  Recent Labs  Lab 02/19/19 1505 02/19/19 1721  WBC 11.5* 13.1*  CREATININE  --  1.55*    Estimated Creatinine Clearance: 54 mL/min (A) (by C-G formula based on SCr of 1.55 mg/dL (H)).    Allergies  Allergen Reactions  . Lisinopril Swelling    Facial swelling    Antimicrobials this admission: Vancomycin 3/27 >>  Cefepime 3/27 >>   Dose adjustments this admission: N/A  Microbiology results: 3/27 BCx:  3/27 UCx:    Thank you for allowing pharmacy to be a part of this patient's care.  Lenord Carbo, PharmD PGY1 Pharmacy Resident Phone: [phone]  Please check AMION for all [facility] phone numbers 02/19/2019 6:34 PM

## 2019-02-19 NOTE — ED Notes (Signed)
Pt on cardiac monitor and auto VS 

## 2019-02-19 NOTE — ED Notes (Signed)
Report to Aundra Millet, Charity fundraiser at [facility] [phone])

## 2019-02-19 NOTE — ED Notes (Signed)
Pt. Reports he had returned from [place] the end of Feb. And has been doing his work from home.

## 2019-02-19 NOTE — Progress Notes (Signed)
68 year old male with history of myasthenia gravis, and with fever of 102 and myasthenia flare with increased weakness in the lower extremities.  Neurology, Dr. Jerrell Belfast, was consulted and accepted the patient but wants internal medicine to manage due to fever.  Urine analysis and culture are pending.  No shortness of breath cough and negative chest x-ray.  White blood cell count elevated.  Patient started on broad-spectrum antibiotics and placed on droplet precautions.  Check LDH, procalcitonin and other relevant labs when patient arrives .  Patient has history of travel to [place] but 4 weeks ago otherwise he is confined to his house at [employer].

## 2019-02-19 NOTE — Progress Notes (Signed)
Virtual Visit via Telephone Note  I connected with Jonathan Mcgee on 02/19/19 at 10:39 AM by telephone and verified that I am speaking with the correct person using two identifiers.   I discussed the limitations, risks, security and privacy concerns of performing an evaluation and management service by telephone and the availability of in person appointments. I also discussed with the patient that there may be a patient responsible charge related to this service. The patient expressed understanding and agreed to proceed, consent obtained  Chief complaint: Multiple concerns - med refills, swelling.  10min chart review prior to call.   History of Present Illness:   History of myasthenia gravis, hyperlipidemia, hypertension, chronic kidney disease, diabetes.  I last saw him in April 2019, but was evaluated in November by Dr. Alwyn RenHopper.  Myasthenia gravis:,  Most recently seen February 24 by Dr. Marjory LiesPenumalli.  Ocular and generalized myasthenia gravis.  Symptoms were worse, some weakness in his left leg, mild generalized fatigue and weakness, worse with exertion.  No speech swelling or breathing issues.  Was continued on Mestinon at that time -60 mg twice a day.  Considered prednisone 60 mg daily for longer term treatment of MG, but caution due to living at a distance and diabetes.  He was started on steroid sparing therapy with Imuran instead, initially 50 mg daily x2 weeks then 100 mg daily x2 weeks, then 150 mg daily - currently on 50mg  daily.   Polyarthralgias Using walker for polyarthralgias.  Joint swelling and pain discussed at April 2019 visit.  Previous history of gout, but uric acid was normal at 4.7 last April. CCP elevated at 185 in April, sed rate elevated at 38.  Recommended meeting with rheumatologist at that time. Did not meet with rheumatologist - unknown reason,   Still having some pains in knees, feet, bones in feet. Inflammation in both knees. Knees feels hot to touch, and hard,   trouble maneuvering due to pain starting this week. Cant get up from sitting position due to pain and decreased strength to get up in legs.   Hypertension: BP Readings from Last 3 Encounters:  01/18/19 138/70  10/24/18 (!) 152/75  04/15/18 125/64   Lab Results  Component Value Date   CREATININE 1.48 (H) 10/24/2018  Elevated in November but recent testing at neurology was improved.  Currently on HCTZ 25 mg daily, losartan 100 mg daily, amlodipine  7.5 mg daily.  Has experienced some diffuse swelling. 3+ pitting edema noted on the right foot, 2+ on the left when seen in November.  TSH normal at that time, creatinine had increased slightly from 1.39-1.48. albumin normal.    Diabetes: Not taking Jardiance due to cost in November but A1c was 7.5.  Continued on metformin 1000 mg twice daily. Microalbumin: Slightly elevated at 37.5 in April 2019 Optho, foot exam, pneumovax: Due for foot exam.  Pneumovax previously postponed. Ophthalmology exam 01/19/2019 On ARB and statin.   Lab Results  Component Value Date   HGBA1C 7.5 (A) 10/24/2018   HGBA1C 8.9 03/24/2018   HGBA1C 6.5 (H) 07/19/2017   Lab Results  Component Value Date   LDLCALC 56 10/24/2018   CREATININE 1.48 (H) 10/24/2018    Hyperlipidemia:  Lab Results  Component Value Date   CHOL 118 10/24/2018   HDL 43 10/24/2018   LDLCALC 56 10/24/2018   TRIG 94 10/24/2018   CHOLHDL 2.7 10/24/2018   Lab Results  Component Value Date   ALT 8 10/24/2018   AST 13 10/24/2018  ALKPHOS 95 10/24/2018   BILITOT 0.2 10/24/2018  Currently taking Lipitor 20 mg daily.   12 min call - will shift to in office visit.      Patient Active Problem List   Diagnosis Date Noted  . CKD (chronic kidney disease) 04/12/2017  . Essential hypertension 01/13/2017  . Hyperlipidemia 01/13/2017  . Myasthenia gravis (HCC) 08/28/2016   Past Medical History:  Diagnosis Date  . Arthritis   . Diabetes mellitus without complication (HCC)   .  Hypercholesterolemia   . Hypertension   . Myasthenia gravis (HCC)   . Rheumatoid arthritis [facility])    Past Surgical History:  Procedure Laterality Date  . KNEE SURGERY Right    fractured patella > 20 yrs ago  . PENILE PROSTHESIS IMPLANT  2015   Allergies  Allergen Reactions  . Lisinopril Swelling    Facial swelling   Prior to Admission medications   Medication Sig Start Date End Date Taking? Authorizing Provider  amLODipine (NORVASC) 5 MG tablet TAKE 1 & 1/2 (ONE & ONE-HALF) TABLETS BY MOUTH ONCE DAILY 02/03/19  Yes Shade Flood, MD  atorvastatin (LIPITOR) 20 MG tablet Take 1 tablet by mouth once daily 02/03/19  Yes Shade Flood, MD  azaTHIOprine (IMURAN) 50 MG tablet Take 1 tablet (50 mg total) by mouth daily. 01/18/19  Yes Penumalli, Glenford Bayley, MD  hydrochlorothiazide (HYDRODIURIL) 25 MG tablet Take 1 tablet by mouth once daily 02/03/19  Yes Shade Flood, MD  losartan (COZAAR) 100 MG tablet Take 1 tablet (100 mg total) by mouth daily. 10/24/18  Yes Peyton Najjar, MD  metFORMIN (GLUCOPHAGE) 1000 MG tablet TAKE 1 TABLET BY MOUTH TWICE DAILY WITH MEALS 02/03/19  Yes Shade Flood, MD  pyridostigmine (MESTINON) 60 MG tablet Take 1 tablet (60 mg total) by mouth 3 (three) times daily. 01/18/19  Yes Penumalli, Glenford Bayley, MD   Social History   Socioeconomic History  . Marital status: Single    Spouse name: Not on file  . Number of children: 1  . Years of education: 34  . Highest education level: Not on file  Occupational History    Comment: [employer]  Social Needs  . Financial resource strain: Not on file  . Food insecurity:    Worry: Not on file    Inability: Not on file  . Transportation needs:    Medical: Not on file    Non-medical: Not on file  Tobacco Use  . Smoking status: Current Some Day Smoker    Packs/day: 1.00    Types: Cigars  . Smokeless tobacco: Never Used  Substance and Sexual Activity  . Alcohol use: Yes    Comment: occas  . Drug use: No  . Sexual  activity: Not on file  Lifestyle  . Physical activity:    Days per week: Not on file    Minutes per session: Not on file  . Stress: Not on file  Relationships  . Social connections:    Talks on phone: Not on file    Gets together: Not on file    Attends religious service: Not on file    Active member of club or organization: Not on file    Attends meetings of clubs or organizations: Not on file    Relationship status: Not on file  . Intimate partner violence:    Fear of current or ex partner: Not on file    Emotionally abused: Not on file    Physically abused: Not on file  Forced sexual activity: Not on file  Other Topics Concern  . Not on file  Social History Narrative   Drinks 2-3 caffeine drinks a day      Observations/Objective:   Assessment and Plan: Essential hypertension - Plan: amLODipine (NORVASC) 5 MG tablet, hydrochlorothiazide (HYDRODIURIL) 25 MG tablet, losartan (COZAAR) 100 MG tablet  Hyperlipidemia, unspecified hyperlipidemia type - Plan: atorvastatin (LIPITOR) 20 MG tablet  Type 2 diabetes mellitus with chronic kidney disease, without long-term current use of insulin, unspecified CKD stage (HCC) - Plan: metFORMIN (GLUCOPHAGE) 1000 MG tablet  Type 2 diabetes mellitus with stage 1 chronic kidney disease, without long-term current use of insulin (HCC) - Plan: metFORMIN (GLUCOPHAGE) 1000 MG tablet  Reverted to in office visit - see other notes.   Follow Up Instructions:    I discussed the assessment and treatment plan with the patient. The patient was provided an opportunity to ask questions and all were answered. The patient agreed with the plan and demonstrated an understanding of the instructions.   The patient was advised to call back or seek an in-person evaluation if the symptoms worsen or if the condition fails to improve as anticipated. I provided 12 minutes of non-face-to-face time during this encounter.  Signed,   [name], MD Primary  Care at [facility].  02/19/19

## 2019-02-19 NOTE — ED Notes (Signed)
Date and time results received: 02/19/19 1920 (use smartphrase ".now" to insert current time)  Test: Lactic  Critical Value: 2.0  Name of Provider Notified: Dr. Patria Mane  Orders Received? Or Actions Taken?: no new orders

## 2019-02-19 NOTE — Progress Notes (Signed)
Subjective:    Patient ID: Jonathan Mcgee, male    DOB: 12-03-1950, 68 y.o.   MRN: [id number]  HPI  Jonathan Mcgee is a 68 y.o. male Presents today for: Chief Complaint  Patient presents with  . Edema    swelling and joint pain with difficulty with walking since 02/15/19   Initial telemedicine visit this morning to review his medications and refills, but based on described symptoms that was stopped and patient brought in for face-to-face office visit.  History of myasthenia gravis, hyperlipidemia, hypertension, chronic kidney disease, diabetes.  I last saw him in April 2019, but was evaluated in November by Dr. Alwyn RenHopper.  Myasthenia gravis:,  Most recently seen February 24 by Dr. Marjory LiesPenumalli.  Ocular and generalized myasthenia gravis.  Symptoms were worse, some weakness in his left leg, mild generalized fatigue and weakness, worse with exertion.  No speech swelling or breathing issues.  Was continued on Mestinon at that time -60 mg twice a day.  Considered prednisone 60 mg daily for longer term treatment of MG, but caution due to living at a distance and diabetes.  He was started on steroid sparing therapy with Imuran instead, initially 50 mg daily x2 weeks then 100 mg daily x2 weeks, then 150 mg daily - currently on 50mg  daily.   Polyarthralgias Using walker for polyarthralgias.  Joint swelling and pain discussed at April 2019 visit.  Previous history of gout, but uric acid was normal at 4.7 last April. CCP elevated at 185 in April, sed rate elevated at 38.  Recommended meeting with rheumatologist at that time. Did not meet with rheumatologist - unknown reason  Still having some pains in knees, feet, bones in feet. Inflammation in both knees. Knees feels hot to touch, and hard,  trouble maneuvering due to pain starting this week. Cant get up from sitting position due to pain and decreased strength to get up in legs.  Went to bed 3 nights ago - had shivers/shaking/tremors, aches all over - major  joints in body. still shaking spells next day. Had trouble holding glass due to shaking. No seizure activity. No fever. No cough. Does not feel sick.  Knees have felt warm for past 4 days. Redness in knees following few days.  Decreased appetite for months.  Some problems walking long distance with MG, but unable to get up to sitting position and unable to use legs to support weight (in w/c in office). Unable to weight bear past 3 days.   No known sick contacts. No recent travel in past 2 weeks,  no known exposure to coronavirus infected person.  Hypertension: BP Readings from Last 3 Encounters: 01/18/19 138/70 10/24/18 (!) 152/75 04/15/18 125/64  Lab Results Component Value Date  CREATININE 1.48 (H) 10/24/2018 Elevated in November but recent testing at neurology was improved.  Currently on HCTZ 25 mg daily, losartan 100 mg daily, amlodipine  7.5 mg daily.  Has experienced some diffuse swelling. 3+ pitting edema noted on the right foot, 2+ on the left when seen in November.  TSH normal at that time, creatinine had increased slightly from 1.39-1.48. albumin normal.   Diabetes: Not taking Jardiance due to cost in November but A1c was 7.5.  Continued on metformin 1000 mg twice daily. Microalbumin: Slightly elevated at 37.5 in April 2019 Optho, foot exam, pneumovax: Due for foot exam.  Pneumovax previously postponed. Ophthalmology exam 01/19/2019 On ARB and statin.   Lab Results Component Value Date  HGBA1C 7.5 (A) 10/24/2018  HGBA1C 8.9 03/24/2018  HGBA1C 6.5 (H) 07/19/2017  Lab Results Component Value Date  LDLCALC 56 10/24/2018  CREATININE 1.48 (H) 10/24/2018   Hyperlipidemia:  Lab Results Component Value Date  CHOL 118 10/24/2018  HDL 43 10/24/2018  LDLCALC 56 10/24/2018  TRIG 94 10/24/2018  CHOLHDL 2.7 10/24/2018  Lab Results Component Value Date  ALT 8 10/24/2018  AST 13 10/24/2018  ALKPHOS 95 10/24/2018  BILITOT 0.2 10/24/2018 Currently taking Lipitor 20 mg  daily.    Patient Active Problem List   Diagnosis Date Noted  . CKD (chronic kidney disease) 04/12/2017  . Essential hypertension 01/13/2017  . Hyperlipidemia 01/13/2017  . Myasthenia gravis (HCC) 08/28/2016   Past Medical History:  Diagnosis Date  . Arthritis   . Diabetes mellitus without complication (HCC)   . Hypercholesterolemia   . Hypertension   . Myasthenia gravis (HCC)   . Rheumatoid arthritis [facility])    Past Surgical History:  Procedure Laterality Date  . KNEE SURGERY Right    fractured patella > 20 yrs ago  . PENILE PROSTHESIS IMPLANT  2015   Allergies  Allergen Reactions  . Lisinopril Swelling    Facial swelling   Prior to Admission medications   Medication Sig Start Date End Date Taking? Authorizing Provider  amLODipine (NORVASC) 5 MG tablet TAKE 1 & 1/2 (ONE & ONE-HALF) TABLETS BY MOUTH ONCE DAILY 02/03/19  Yes Shade Flood, MD  atorvastatin (LIPITOR) 20 MG tablet Take 1 tablet by mouth once daily 02/03/19  Yes Shade Flood, MD  azaTHIOprine (IMURAN) 50 MG tablet Take 1 tablet (50 mg total) by mouth daily. 01/18/19  Yes Penumalli, Glenford Bayley, MD  hydrochlorothiazide (HYDRODIURIL) 25 MG tablet Take 1 tablet by mouth once daily 02/03/19  Yes Shade Flood, MD  losartan (COZAAR) 100 MG tablet Take 1 tablet (100 mg total) by mouth daily. 10/24/18  Yes Peyton Najjar, MD  metFORMIN (GLUCOPHAGE) 1000 MG tablet TAKE 1 TABLET BY MOUTH TWICE DAILY WITH MEALS 02/03/19  Yes Shade Flood, MD  pyridostigmine (MESTINON) 60 MG tablet Take 1 tablet (60 mg total) by mouth 3 (three) times daily. 01/18/19  Yes Penumalli, Glenford Bayley, MD   Social History   Socioeconomic History  . Marital status: Single    Spouse name: Not on file  . Number of children: 1  . Years of education: 89  . Highest education level: Not on file  Occupational History    Comment: [employer]  Social Needs  . Financial resource strain: Not on file  . Food insecurity:    Worry: Not on file     Inability: Not on file  . Transportation needs:    Medical: Not on file    Non-medical: Not on file  Tobacco Use  . Smoking status: Current Some Day Smoker    Packs/day: 1.00    Types: Cigars  . Smokeless tobacco: Never Used  Substance and Sexual Activity  . Alcohol use: Yes    Comment: occas  . Drug use: No  . Sexual activity: Not on file  Lifestyle  . Physical activity:    Days per week: Not on file    Minutes per session: Not on file  . Stress: Not on file  Relationships  . Social connections:    Talks on phone: Not on file    Gets together: Not on file    Attends religious service: Not on file    Active member of club or organization: Not on file    Attends meetings of  clubs or organizations: Not on file    Relationship status: Not on file  . Intimate partner violence:    Fear of current or ex partner: Not on file    Emotionally abused: Not on file    Physically abused: Not on file    Forced sexual activity: Not on file  Other Topics Concern  . Not on file  Social History Narrative   Drinks 2-3 caffeine drinks a day     Review of Systems  Constitutional: Positive for chills (shaking. ). Negative for fever.  Respiratory: Negative for cough and shortness of breath.   Musculoskeletal: Positive for arthralgias and joint swelling.  Skin: Positive for color change (redness knees. ).  Neurological: Positive for tremors and weakness. Negative for seizures, syncope and speech difficulty.   And per HPI.     Objective:   Physical Exam Vitals signs reviewed.  Constitutional:      Appearance: He is well-developed.  HENT:     Head: Normocephalic and atraumatic.  Eyes:     Pupils: Pupils are equal, round, and reactive to light.  Neck:     Vascular: No carotid bruit or JVD.  Cardiovascular:     Rate and Rhythm: Normal rate and regular rhythm.     Heart sounds: Normal heart sounds. No murmur.  Pulmonary:     Effort: Pulmonary effort is normal.     Breath sounds:  Normal breath sounds. No rales.  Abdominal:     General: Abdomen is flat.     Tenderness: There is no abdominal tenderness.  Musculoskeletal:     Right lower leg: Edema (1+ edema at ankles only, do not appreciate significant lower extremity edema pretibial) present.     Left lower leg: Edema present.     Comments: Right knee: 1+ effusion, well-healed surgical scar, warmth right knee joint without apparent erythema noted, guarded exam with minimal range of motion.  Left knee, guarded exam, trace effusion, pain with motion.  Skin:    General: Skin is warm and dry.     Findings: No rash.  Neurological:     Mental Status: He is alert and oriented to person, place, and time.    Nontoxic, but does have some tremulous activity at times and hands during visit, not persistent, no true seizure activity noted.  Responsive to questions normally. Vitals:   02/19/19 1348  BP: 119/68  Pulse: 95  Resp: 16  Temp: 99.8 F (37.7 C)  TempSrc: Oral  SpO2: 98%  Weight: 220 lb (99.8 kg)   Results for orders placed or performed in visit on 02/19/19  POCT CBC  Result Value Ref Range   WBC 11.5 (A) 4.6 - 10.2 K/uL   Lymph, poc 1.1 0.6 - 3.4   POC LYMPH PERCENT 9.6 (A) 10 - 50 %L   MID (cbc) 0.7 0 - 0.9   POC MID % 5.8 0 - 12 %M   POC Granulocyte 9.7 (A) 2 - 6.9   Granulocyte percent 84.6 (A) 37 - 80 %G   RBC 3.80 (A) 4.69 - 6.13 M/uL   Hemoglobin 12.4 11 - 14.6 g/dL   HCT, POC 76.1 29 - 41 %   MCV 95.3 76 - 111 fL   MCH, POC 32.6 (A) 27 - 31.2 pg   MCHC 34.2 31.8 - 35.4 g/dL   RDW, POC 60.7 %   Platelet Count, POC 244 142 - 424 K/uL   MPV 6.7 0 - 99.8 fL  Assessment & Plan:   Jonathan Mcgee is a 69 y.o. 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 disease, without long-term current use of insulin, unspecified CKD stage (HCC) - Plan: Comprehensive metabolic panel, Hemoglobin A1c, metFORMIN (GLUCOPHAGE) 1000 MG tablet  Polyarthralgia - Plan: POCT CBC, Sedimentation Rate, ANA,IFA  RA Diag Pnl w/rflx Tit/Patn  Essential hypertension - Plan: POCT CBC, amLODipine (NORVASC) 5 MG tablet, hydrochlorothiazide (HYDRODIURIL) 25 MG tablet, losartan (COZAAR) 100 MG tablet  Hyperlipidemia, unspecified hyperlipidemia type - Plan: Lipid panel, atorvastatin (LIPITOR) 20 MG tablet  Episode of shaking - Plan: POCT CBC  MG (myasthenia gravis) (HCC)  Type 2 diabetes mellitus with stage 1 chronic kidney disease, without long-term current use of insulin (HCC) - Plan: metFORMIN (GLUCOPHAGE) 1000 MG tablet  Chronic medications refilled while waiting labs.  May need separate visit to review those in more detail depending on results.  Primary concern today of acute onset lower extremity weakness, bilaterally with bilateral knee pain and swelling, reported redness at home.  Also with shaking episodes which could be chills 3 days ago without fever. Currently on Imuran, with risk for immunosuppression, concern for acute infection with leukocytosis versus myasthenia gravis crisis.  Denies dyspnea or respiratory distress at this time.  -Mask placed on patient given recent diffuse body aches and shaking episodes although no true fever or cough at this time.  Further evaluation through emergency room today. Staff at [facility] advised.   Meds ordered this encounter  Medications  . amLODipine (NORVASC) 5 MG tablet    Sig: TAKE 1 & 1/2 (ONE & ONE-HALF) TABLETS BY MOUTH ONCE DAILY    Dispense:  90 tablet    Refill:  1  . atorvastatin (LIPITOR) 20 MG tablet    Sig: Take 1 tablet (20 mg total) by mouth daily.    Dispense:  90 tablet    Refill:  1  . hydrochlorothiazide (HYDRODIURIL) 25 MG tablet    Sig: Take 1 tablet (25 mg total) by mouth daily.    Dispense:  90 tablet    Refill:  1  . losartan (COZAAR) 100 MG tablet    Sig: Take 1 tablet (100 mg total) by mouth daily.    Dispense:  90 tablet    Refill:  1  . metFORMIN (GLUCOPHAGE) 1000 MG tablet    Sig: Take 1 tablet (1,000 mg total) by mouth 2  (two) times daily with a meal.    Dispense:  180 tablet    Refill:  1   Patient Instructions       If you have lab work done today you will be contacted with your lab results within the next 2 weeks.  If you have not heard from [place] then please contact us. The fastest way to get your results is to register for My Chart.   IF you received an x-ray today, you will receive an invoice from [facility]. Please contact [facility] at [phone] with questions or concerns regarding your invoice.   IF you received labwork today, you will receive an invoice from [facility]. Please contact [facility] at [phone] with questions or concerns regarding your invoice.   Our billing staff will not be able to assist you with questions regarding bills from these companies.  You will be contacted with the lab results as soon as they are available. The fastest way to get your results is to activate your My Chart account. Instructions are located on the last page of this paperwork. If you have  not heard from us regarding the results in 2 weeks, please contact this office.       Signed,   Meredith StaggersJeffrey Rashea Hoskie, MD [facility].  02/19/19 2:54 PM

## 2019-02-19 NOTE — Progress Notes (Signed)
RFV-  Patient is frustrated for not being able to get in to see you since today. It has been taking a while to get in with you. Patient stated he needed 90 day supply on medication and he shouldn't have to keep calling in to get refills on his meds. Patient stated he needs refills on all his meds.   Patient stated he is having pain and swelling in both his feet, ankles,legs and knees. Pain in joints. Difficult to walk and get around due to the swelling and pain. Using a walker at this time to get around.

## 2019-02-19 NOTE — ED Notes (Signed)
Patient had to be assisted getting out of car. Pt stated he could not put weight on legs. Upon getting pt from wheelchair to bed, 3 person assist.[place], [place] and [place].  Pt able to assist with undressing and placed in gown.

## 2019-02-19 NOTE — ED Notes (Signed)
Called [facility] and notified that bed was ready

## 2019-02-19 NOTE — Patient Instructions (Addendum)
Please call Dr. Richrd Humbles office to discuss dosing of Imuran.      If you have lab work done today you will be contacted with your lab results within the next 2 weeks.  If you have not heard from [place] then please contact us. The fastest way to get your results is to register for My Chart.   IF you received an x-ray today, you will receive an invoice from [facility]. Please contact [facility] at [phone] with questions or concerns regarding your invoice.   IF you received labwork today, you will receive an invoice from [place]. Please contact [facility] at [phone] with questions or concerns regarding your invoice.   Our billing staff will not be able to assist you with questions regarding bills from these companies.  You will be contacted with the lab results as soon as they are available. The fastest way to get your results is to activate your My Chart account. Instructions are located on the last page of this paperwork. If you have not heard from [place] regarding the results in 2 weeks, please contact this office.

## 2019-02-19 NOTE — ED Notes (Signed)
Patient transported to X-ray 

## 2019-02-19 NOTE — Patient Instructions (Addendum)
  I will refill your usual medications for now and lab work was obtained.  Likely will still need to meet with rheumatologist to discuss the joint aches as we mentioned last year.  However I am concerned about the acute onset of leg weakness and increasing pain, shaking episodes.  With history of myasthenia gravis, I am concerned that could be related to a flare of the myasthenia but also with the shaking episodes concern for possible infection.  Please proceed to the emergency room after this visit for further evaluation.  I did provide a mask in the meantime but can be discussed with provider in the ER if that needs to remain in place.   If you have lab work done today you will be contacted with your lab results within the next 2 weeks.  If you have not heard from [place] then please contact us. The fastest way to get your results is to register for My Chart.   IF you received an x-ray today, you will receive an invoice from [facility]. Please contact [facility] at [phone] with questions or concerns regarding your invoice.   IF you received labwork today, you will receive an invoice from [place]. Please contact [facility] at [phone] with questions or concerns regarding your invoice.   Our billing staff will not be able to assist you with questions regarding bills from these companies.  You will be contacted with the lab results as soon as they are available. The fastest way to get your results is to activate your My Chart account. Instructions are located on the last page of this paperwork. If you have not heard from [place] regarding the results in 2 weeks, please contact this office.

## 2019-02-19 NOTE — ED Triage Notes (Signed)
Pt was sent by his PCP for increased weakness in his legs, possibly d/t his myasthenia gravis. Pt states it started Tuesday with all over body pain, and has progressed. Pt denies any known COVID contacts, denies fevers

## 2019-02-19 NOTE — ED Provider Notes (Addendum)
[facility] EMERGENCY DEPARTMENT Provider Note   CSN: [id number] Arrival date & time: 02/19/19  1531    History   Chief Complaint Chief Complaint  Patient presents with  . Weakness    HPI Jonathan Mcgee is a 68 y.o. male.     HPI Patient is a 68 year old male with a history of diabetes and myasthenia gravis.  His myasthenia is been managed with Imuran and Mestinon.  He has been tolerating this well.  He presents the emergency department because of increasing weakness in his bilateral lower extremities worsening over the past week.  He feels like he may be a flare of his myasthenia.  He has had what sounds like Reiger's over the past 3 days.  No cough or shortness of breath.  He denies abdominal pain.  No nausea vomiting or diarrhea.  No urinary complaints.  No new rash.  No known sick contacts.  He does work in [place] and reports returning from [place] at the end of February.  He has no known exposures to COVID-19 or patients under investigation.  He states he has been trying to stay at home.  He is on Imuran.  He did have a cataract worked on 3 weeks ago.  No recent dental procedures.     Past Medical History:  Diagnosis Date  . Arthritis   . Diabetes mellitus without complication (HCC)   . Hypercholesterolemia   . Hypertension   . Myasthenia gravis (HCC)   . Rheumatoid arthritis [facility])     Patient Active Problem List   Diagnosis Date Noted  . CKD (chronic kidney disease) 04/12/2017  . Essential hypertension 01/13/2017  . Hyperlipidemia 01/13/2017  . Myasthenia gravis (HCC) 08/28/2016    Past Surgical History:  Procedure Laterality Date  . KNEE SURGERY Right    fractured patella > 20 yrs ago  . PENILE PROSTHESIS IMPLANT  2015        Home Medications    Prior to Admission medications   Medication Sig Start Date End Date Taking? Authorizing Provider  amLODipine (NORVASC) 5 MG tablet TAKE 1 & 1/2 (ONE & ONE-HALF) TABLETS BY MOUTH ONCE DAILY 02/19/19    Shade Flood, MD  atorvastatin (LIPITOR) 20 MG tablet Take 1 tablet (20 mg total) by mouth daily. 02/19/19   Shade Flood, MD  azaTHIOprine (IMURAN) 50 MG tablet Take 1 tablet (50 mg total) by mouth daily. 01/18/19   Penumalli, Glenford Bayley, MD  hydrochlorothiazide (HYDRODIURIL) 25 MG tablet Take 1 tablet (25 mg total) by mouth daily. 02/19/19   Shade Flood, MD  losartan (COZAAR) 100 MG tablet Take 1 tablet (100 mg total) by mouth daily. 02/19/19   Shade Flood, MD  metFORMIN (GLUCOPHAGE) 1000 MG tablet Take 1 tablet (1,000 mg total) by mouth 2 (two) times daily with a meal. 02/19/19   Shade Flood, MD  pyridostigmine (MESTINON) 60 MG tablet Take 1 tablet (60 mg total) by mouth 3 (three) times daily. 01/18/19   Penumalli, Glenford Bayley, MD    Family History Family History  Problem Relation Age of Onset  . Diabetes Mother   . Heart disease Mother   . Hyperlipidemia Mother   . Heart attack Mother   . Hyperlipidemia Father   . Kidney failure Father   . Drug abuse Maternal Grandmother   . Heart disease Maternal Grandmother   . Hypertension Maternal Grandmother   . Hyperlipidemia Maternal Grandmother   . Diabetes Maternal Grandmother     Social  History Social History   Tobacco Use  . Smoking status: Current Some Day Smoker    Packs/day: 1.00    Types: Cigars  . Smokeless tobacco: Never Used  Substance Use Topics  . Alcohol use: Yes    Comment: occas  . Drug use: No     Allergies   Lisinopril   Review of Systems Review of Systems  All other systems reviewed and are negative.    Physical Exam Updated Vital Signs BP (!) 162/65 (BP Location: Right Arm)   Pulse 92   Temp (!) 102 F (38.9 C) (Oral) Comment: took Pt. temp x 2 in room one  Resp 18   Ht 5\' 10"  (1.778 m)   Wt 99.8 kg   SpO2 98%   BMI 31.57 kg/m   Physical Exam Vitals signs and nursing note reviewed.  Constitutional:      Appearance: He is well-developed.  HENT:     Head: Normocephalic  and atraumatic.  Neck:     Musculoskeletal: Normal range of motion.  Cardiovascular:     Rate and Rhythm: Normal rate and regular rhythm.     Heart sounds: Normal heart sounds.  Pulmonary:     Effort: Pulmonary effort is normal. No respiratory distress.     Breath sounds: Normal breath sounds.  Abdominal:     General: There is no distension.     Palpations: Abdomen is soft.     Tenderness: There is no abdominal tenderness.  Musculoskeletal: Normal range of motion.     Comments: Unable lift the right leg off the bed despite significant effort.  Able to lift the left leg off the bed for 2 to 3 seconds  Skin:    General: Skin is warm and dry.  Neurological:     Mental Status: He is alert and oriented to person, place, and time.  Psychiatric:        Judgment: Judgment normal.      ED Treatments / Results  Labs (all labs ordered are listed, but only abnormal results are displayed) Labs Reviewed  CBC WITH DIFFERENTIAL/PLATELET - Abnormal; Notable for the following components:      Result Value   WBC 13.1 (*)    RBC 3.81 (*)    Hemoglobin 12.3 (*)    HCT 36.5 (*)    Neutro Abs 11.6 (*)    Lymphs Abs 0.3 (*)    Monocytes Absolute 1.1 (*)    All other components within normal limits  COMPREHENSIVE METABOLIC PANEL - Abnormal; Notable for the following components:   Glucose, Bld 192 (*)    BUN 26 (*)    Creatinine, Ser 1.55 (*)    Total Protein 8.4 (*)    GFR calc non Af Amer 45 (*)    GFR calc Af Amer 53 (*)    All other components within normal limits  LACTIC ACID, PLASMA - Abnormal; Notable for the following components:   Lactic Acid, Venous 2.0 (*)    All other components within normal limits  CBG MONITORING, ED - Abnormal; Notable for the following components:   Glucose-Capillary 174 (*)    All other components within normal limits  CULTURE, BLOOD (ROUTINE X 2)  CULTURE, BLOOD (ROUTINE X 2)  URINE CULTURE  URINALYSIS, ROUTINE W REFLEX MICROSCOPIC  LACTIC ACID,  PLASMA  PROCALCITONIN    EKG None  Radiology Dg Chest 2 View  Result Date: 02/19/2019 CLINICAL DATA:  Fever, pain swelling and redness in both legs, diabetes mellitus, hypertension,  myasthenia gravis EXAM: CHEST - 2 VIEW COMPARISON:  CT chest 09/03/2016 FINDINGS: Enlargement of cardiac silhouette, which may be accentuated by lordotic technique. Mediastinal contours and pulmonary vascularity normal. Lungs clear. No infiltrate, pleural effusion or pneumothorax. Scattered degenerative disc disease changes of the thoracic spine. Impression enlargement of cardiac silhouette. No acute infiltrates. IMPRESSION: No active cardiopulmonary disease. Electronically Signed   By: Ulyses SouthwardMark  Boles M.D.   On: 02/19/2019 17:18    Procedures .Critical Care Performed by: Azalia Bilisampos, Daysi Boggan, MD Authorized by: Azalia Bilisampos, Adyan Palau, MD   Critical care provider statement:    Critical care time (minutes):  32   Critical care was time spent personally by me on the following activities:  Discussions with consultants, evaluation of patient's response to treatment, examination of patient, ordering and performing treatments and interventions, ordering and review of laboratory studies, ordering and review of radiographic studies, pulse oximetry, re-evaluation of patient's condition, obtaining history from patient or surrogate and review of old charts   (including critical care time)  Medications Ordered in ED Medications  vancomycin (VANCOCIN) 2,000 mg in sodium chloride 0.9 % 500 mL IVPB (2,000 mg Intravenous New Bag/Given 02/19/19 1907)  ceFEPIme (MAXIPIME) 2 g in sodium chloride 0.9 % 100 mL IVPB (has no administration in time range)  ceFEPIme (MAXIPIME) 2 g injection (has no administration in time range)  vancomycin (VANCOCIN) 1-5 GM/200ML-% IVPB (has no administration in time range)  0.9 %  sodium chloride infusion (250 mLs Intravenous New Bag/Given 02/19/19 1905)  0.9 %  sodium chloride infusion (has no administration in time  range)  sodium chloride 0.9 % bolus 2,994 mL (3,000 mLs Intravenous New Bag/Given 02/19/19 1901)     Initial Impression / Assessment and Plan / ED Course  I have reviewed the triage vital signs and the nursing notes.  Pertinent labs & imaging results that were available during my care of the patient were reviewed by me and considered in my medical decision making (see chart for details).       I spoke with Dr Wilford CornerArora with Neurology who will see in consultation, however, he is requesting that the admitting team see and evaluate the patient first given his fever to 102 as well as recent travel to [place] to evaluate for the possibility of COVID-19 prior to his or his colleagues evaluation.  Patient has no back pain.  If no other clear etiology can be found he may benefit from MRI imaging but this is unable to be completed emergently here.  Will discuss antibiotics with admitting team.  Found to be febrile to 102 here in the emergency department.  His only COVID-19 risk factor would be his travel to [place] although that was many weeks ago.  No cough or congestion.  No pneumonia on chest x-ray.  Blood cultures obtained.  Current limited supplies limits my ability to check the patient for influenza here in the emergency department.  Patient will benefit from neurology consultation and hospitalist admission overnight given progressive lower extremity weakness and fever to 102.   Final Clinical Impressions(s) / ED Diagnoses   Final diagnoses:  Myasthenia gravis with exacerbation (HCC)  Fever, unspecified fever cause    ED Discharge Orders    None       Azalia Bilisampos, Leotis Isham, MD 02/19/19 1818    Azalia Bilisampos, Karmello Abercrombie, MD 02/19/19 Silva [id number]    Azalia Bilisampos, Baylyn Sickles, MD 02/19/19 Barry [id number]

## 2019-02-20 ENCOUNTER — Inpatient Hospital Stay (HOSPITAL_COMMUNITY): Payer: BC Managed Care – PPO

## 2019-02-20 ENCOUNTER — Encounter (HOSPITAL_COMMUNITY): Payer: Self-pay

## 2019-02-20 DIAGNOSIS — E119 Type 2 diabetes mellitus without complications: Secondary | ICD-10-CM

## 2019-02-20 DIAGNOSIS — N390 Urinary tract infection, site not specified: Secondary | ICD-10-CM

## 2019-02-20 DIAGNOSIS — G7000 Myasthenia gravis without (acute) exacerbation: Secondary | ICD-10-CM

## 2019-02-20 DIAGNOSIS — R6889 Other general symptoms and signs: Secondary | ICD-10-CM

## 2019-02-20 DIAGNOSIS — Z20822 Contact with and (suspected) exposure to covid-19: Secondary | ICD-10-CM

## 2019-02-20 DIAGNOSIS — A419 Sepsis, unspecified organism: Principal | ICD-10-CM

## 2019-02-20 LAB — CBC
HCT: 30.4 % — ABNORMAL LOW (ref 39.0–52.0)
Hemoglobin: 10.2 g/dL — ABNORMAL LOW (ref 13.0–17.0)
MCH: 31.7 pg (ref 26.0–34.0)
MCHC: 33.6 g/dL (ref 30.0–36.0)
MCV: 94.4 fL (ref 80.0–100.0)
Platelets: 193 10*3/uL (ref 150–400)
RBC: 3.22 MIL/uL — ABNORMAL LOW (ref 4.22–5.81)
RDW: 11.5 % (ref 11.5–15.5)
WBC: 8.1 10*3/uL (ref 4.0–10.5)
nRBC: 0 % (ref 0.0–0.2)

## 2019-02-20 LAB — BASIC METABOLIC PANEL
ANION GAP: 9 (ref 5–15)
BUN: 21 mg/dL (ref 8–23)
CO2: 23 mmol/L (ref 22–32)
Calcium: 8.3 mg/dL — ABNORMAL LOW (ref 8.9–10.3)
Chloride: 104 mmol/L (ref 98–111)
Creatinine, Ser: 1.73 mg/dL — ABNORMAL HIGH (ref 0.61–1.24)
GFR calc non Af Amer: 40 mL/min — ABNORMAL LOW (ref >60)
GFR, EST AFRICAN AMERICAN: 46 mL/min — AB (ref >60)
Glucose, Bld: 176 mg/dL — ABNORMAL HIGH (ref 70–99)
POTASSIUM: 3.8 mmol/L (ref 3.5–5.1)
Sodium: 136 mmol/L (ref 135–145)

## 2019-02-20 LAB — INFLUENZA PANEL BY PCR (TYPE A & B)
INFLAPCR: NEGATIVE
Influenza B By PCR: NEGATIVE

## 2019-02-20 LAB — GLUCOSE, CAPILLARY
Glucose-Capillary: 163 mg/dL — ABNORMAL HIGH (ref 70–99)
Glucose-Capillary: 156 mg/dL — ABNORMAL HIGH (ref 70–99)
Glucose-Capillary: 195 mg/dL — ABNORMAL HIGH (ref 70–99)
Glucose-Capillary: 171 mg/dL — ABNORMAL HIGH (ref 70–99)
Glucose-Capillary: 129 mg/dL — ABNORMAL HIGH (ref 70–99)

## 2019-02-20 LAB — LACTATE DEHYDROGENASE: LDH: 132 U/L (ref 98–192)

## 2019-02-20 LAB — FERRITIN: Ferritin: 146 ng/mL (ref 24–336)

## 2019-02-20 LAB — HEMOGLOBIN A1C
Hgb A1c MFr Bld: 8.4 % — ABNORMAL HIGH (ref 4.8–5.6)
Mean Plasma Glucose: 194.38 mg/dL

## 2019-02-20 LAB — SEDIMENTATION RATE: Sed Rate: 107 mm/hr — ABNORMAL HIGH (ref 0–16)

## 2019-02-20 LAB — LACTIC ACID, PLASMA: Lactic Acid, Venous: 1.5 mmol/L (ref 0.5–1.9)

## 2019-02-20 LAB — MRSA PCR SCREENING: MRSA by PCR: NEGATIVE

## 2019-02-20 LAB — C-REACTIVE PROTEIN: CRP: 9.2 mg/dL — ABNORMAL HIGH (ref <1.0)

## 2019-02-20 MED ORDER — ENOXAPARIN SODIUM 40 MG/0.4ML ~~LOC~~ SOLN
40.0000 mg | Freq: Every day | SUBCUTANEOUS | Status: DC
  Administered 2019-02-20 – 2019-02-21 (×2): 40 mg via SUBCUTANEOUS
  Filled 2019-02-20 (×2): qty 0.4

## 2019-02-20 MED ORDER — INSULIN ASPART 100 UNIT/ML ~~LOC~~ SOLN
0.0000 [IU] | Freq: Three times a day (TID) | SUBCUTANEOUS | Status: DC
  Administered 2019-02-20 – 2019-02-21 (×4): 2 [IU] via SUBCUTANEOUS

## 2019-02-20 MED ORDER — ATORVASTATIN CALCIUM 10 MG PO TABS
20.0000 mg | ORAL_TABLET | Freq: Every day | ORAL | Status: DC
  Administered 2019-02-20 – 2019-02-21 (×2): 20 mg via ORAL
  Filled 2019-02-20 (×2): qty 2

## 2019-02-20 MED ORDER — SODIUM CHLORIDE 0.9 % IV SOLN
1.0000 g | INTRAVENOUS | Status: DC
  Administered 2019-02-20 – 2019-02-21 (×2): 1 g via INTRAVENOUS
  Filled 2019-02-20 (×2): qty 10

## 2019-02-20 MED ORDER — SODIUM CHLORIDE 0.9 % IV SOLN
INTRAVENOUS | Status: DC
  Administered 2019-02-20: 05:00:00 via INTRAVENOUS

## 2019-02-20 MED ORDER — SODIUM CHLORIDE 0.9 % IV BOLUS
1000.0000 mL | Freq: Once | INTRAVENOUS | Status: AC
  Administered 2019-02-20: 1000 mL via INTRAVENOUS

## 2019-02-20 MED ORDER — PYRIDOSTIGMINE BROMIDE 60 MG PO TABS
60.0000 mg | ORAL_TABLET | Freq: Three times a day (TID) | ORAL | Status: DC
  Administered 2019-02-20 – 2019-02-21 (×5): 60 mg via ORAL
  Filled 2019-02-20 (×6): qty 1

## 2019-02-20 MED ORDER — ACETAMINOPHEN 325 MG PO TABS
650.0000 mg | ORAL_TABLET | Freq: Four times a day (QID) | ORAL | Status: DC | PRN
  Administered 2019-02-20: 650 mg via ORAL
  Filled 2019-02-20: qty 2

## 2019-02-20 NOTE — Progress Notes (Signed)
PROGRESS NOTE    Jonathan Mcgee  TFT:[id number] DOB: [birth year] DOA: 02/19/2019 PCP: Wendie Agreste, MD      Brief Narrative:  Jonathan Mcgee is a 68 y.o. M with MG recently started Imuran, HTN, and DM who presents with bilateral knee pain and warmth, hand pain and elbow pain and warmth.  Patient was in [place] until several days ago when he had insidious onset bilateral knee pain.  This pain was associated with difficulty standing from pain, and also with bilateral pain and stiffness of the small joints of both hands.  It progressed to both elbows, and so he saw his PCP who referred him to the ER.  In the ER, he had temp 102F, fever, pyuria and bacteria in the urine.  CXR clear.  No hypoxia.  He was tested for CoV and admitted on broad spectrum antibiotics.  Neurology were consulted for ?myasthenia flare.       Assessment & Plan:  Rheumatoid flare He reports a 1 year history of episodic bilateral small joint swelling, warmth and pain/stiffness.  Had had a negative RF but strongly positive anti-CCP Ab titer (185 units) last April and been referred to Rheum but never made it.  This came in waves lasting days to weeks, self-treated with OTC pain medication.    Now with symmetric polyarthralgia, warmth of bilateral knees, elbows, tenderness of all DIPs/PIPs.  ESR >100, CRP >9.  RF obtained by PCP yesterday and elevated 18 IU/ml.  Anti-CCP pending. -Follow anti-CCP -Will need radiographs of hands -Will monitor for possible sepsis and start prednisone 10 when resolved -Has follow up with [facility]; needs referral ambulatory at discharge     Possible UTI sepsis He presented with fever, leukocytosis.  Lactate 2.0 mmol/L.  CXR clear but UA with bacteria and pyuria (in diabetic). -Stop vanc/cefepime -Start ceftriaxone -Follow blood and urine cultures  Coronavirus rule out Fever is from RA, not CoV.  He has travel history >3 weeks ago, since then has been home.  No infiltrates on CXR  or hypoxia. -Follow CoV NAAT  Myasthenia gravis He has no change at all in bulbar symptoms, which effectively rules out that this is a myasthenia flare or crisis.  He is now more consistent in stating that his leg "weakness' is actually limitation from pain. -Continue Mestinon -Hold Imuran given ?sepsis  Diabetes -Hold home metformin -SSI insulin with meals -Continue atorvastatin  Hypertension -Hold home amlodipine, losartan, HCTZ for now untuil hemodynamics clearer   Normocytic anemia Hgb 10, this is new  CKD stage III  Bseline Cr 1.4-1.7, stable relative to baseline        DVT prophylaxis: Lovenox Code Status: FULL Family Communication: None MDM and disposition Plan: This is a no charge note.  For further details, please see H&P by my partner Dr. Marlowe Sax from earlier today.  The below labs and imaging reports were reviewed and summarized above.    The patient was admitted with rheumatoid flare.    Objective: Vitals:   02/20/19 0522 02/20/19 0756 02/20/19 0926 02/20/19 1100  BP: 136/61 (!) 153/68    Pulse: 74 84    Resp:  (!) 22    Temp: 99.3 F (37.4 C) 100 F (37.8 C) (!) 101.6 F (38.7 C) 99.1 F (37.3 C)  TempSrc: Oral Oral Oral Oral  SpO2: 99% 98%    Weight:      Height:        Intake/Output Summary (Last 24 hours) at 02/20/2019 1447 Last data filed  at 02/20/2019 0649 Gross per 24 hour  Intake 2741.81 ml  Output 1025 ml  Net 1716.81 ml   Filed Weights   02/19/19 1541 02/20/19 0014  Weight: 99.8 kg 98.8 kg    Examination: The patient was seen and examined.      Data Reviewed: I have personally reviewed following labs and imaging studies:  CBC: Recent Labs  Lab 02/19/19 1505 02/19/19 1721 02/20/19 0303  WBC 11.5* 13.1* 8.1  NEUTROABS  --  11.6*  --   HGB 12.4 12.3* 10.2*  HCT 36.2 36.5* 30.4*  MCV 95.3 95.8 94.4  PLT  --  202 416   Basic Metabolic Panel: Recent Labs  Lab 02/19/19 1603 02/19/19 1721 02/20/19 0303  NA 137 135  136  K 4.0 3.6 3.8  CL 95* 99 104  CO2 _0 GLUCOSE 193* 192* 176*  BUN 23 26* 21  CREATININE 1.76* 1.55* 1.73*  CALCIUM 9.3 9.0 8.3*   GFR: Estimated Creatinine Clearance: 48.2 mL/min (A) (by C-G formula based on SCr of 1.73 mg/dL (H)). Liver Function Tests: Recent Labs  Lab 02/19/19 1603 02/19/19 1721  AST 16 17  ALT 11 15  ALKPHOS 83 79  BILITOT 0.5 0.9  PROT 7.4 8.4*  ALBUMIN 4.6 4.1   No results for input(s): LIPASE, AMYLASE in the last 168 hours. No results for input(s): AMMONIA in the last 168 hours. Coagulation Profile: No results for input(s): INR, PROTIME in the last 168 hours. Cardiac Enzymes: No results for input(s): CKTOTAL, CKMB, CKMBINDEX, TROPONINI in the last 168 hours. BNP (last 3 results) No results for input(s): PROBNP in the last 8760 hours. HbA1C: Recent Labs    02/19/19 1603 02/20/19 0303  HGBA1C 8.5* 8.4*   CBG: Recent Labs  Lab 02/19/19 1711 02/20/19 0321 02/20/19 0755 02/20/19 1237  GLUCAP 174* 163* 156* 195*   Lipid Profile: Recent Labs    02/19/19 1603  CHOL 134  HDL 41  LDLCALC 67  TRIG 128  CHOLHDL 3.3   Thyroid Function Tests: No results for input(s): TSH, T4TOTAL, FREET4, T3FREE, THYROIDAB in the last 72 hours. Anemia Panel: Recent Labs    02/20/19 0303  FERRITIN 146   Urine analysis:    Component Value Date/Time   COLORURINE YELLOW 02/19/2019 2218   APPEARANCEUR CLOUDY (A) 02/19/2019 2218   LABSPEC 1.020 02/19/2019 2218   PHURINE 5.5 02/19/2019 2218   GLUCOSEU NEGATIVE 02/19/2019 2218   HGBUR LARGE (A) 02/19/2019 2218   [place] NEGATIVE 02/19/2019 [place] 02/19/2019 2218   PROTEINUR 30 (A) 02/19/2019 2218   NITRITE NEGATIVE 02/19/2019 2218   LEUKOCYTESUR TRACE (A) 02/19/2019 2218   Sepsis Labs: _1 (procalcitonin:4,lacticacidven:4)  ) Recent Results (from the past 240 hour(s))  Blood culture (routine x 2)     Status: None (Preliminary result)   Collection Time:  02/19/19  5:20 PM  Result Value Ref Range Status   Specimen Description   Final    BLOOD RIGHT ARM Performed at [facility], [address] [place], [place] [address]    Special Requests   Final    BOTTLES DRAWN AEROBIC AND ANAEROBIC Blood Culture adequate volume Performed at [facility], [address] [place], [place] [address]    Culture   Final    NO GROWTH < 24 HOURS Performed at [facility], [place] [address] [place], [place] [address]    Report Status PENDING  Incomplete  Blood culture (routine  x 2)     Status: None (Preliminary result)   Collection Time: 02/19/19  5:42 PM  Result Value Ref Range Status   Specimen Description   Final    BLOOD LEFT ARM Performed at [facility], [address] [place], [place] [address]    Special Requests   Final    BOTTLES DRAWN AEROBIC AND ANAEROBIC Blood Culture adequate volume Performed at [facility], [address] [place], [place] [address]    Culture   Final    NO GROWTH < 24 HOURS Performed at [facility], [place] [address], [place], [place] [address]    Report Status PENDING  Incomplete  MRSA PCR Screening     Status: None   Collection Time: 02/20/19  1:17 AM  Result Value Ref Range Status   MRSA by PCR NEGATIVE NEGATIVE Final    Comment:        The GeneXpert MRSA Assay (FDA approved for NASAL specimens only), is one component of a comprehensive MRSA colonization surveillance program. It is not intended to diagnose MRSA infection nor to guide or monitor treatment for MRSA infections. Performed at [facility], [place] [address], [place], [place] [address]          Radiology Studies: Dg Chest 2 View  Result Date: 02/19/2019 CLINICAL DATA:  Fever, pain swelling and redness in both legs, diabetes mellitus, hypertension, myasthenia gravis EXAM: CHEST - 2 VIEW COMPARISON:  CT chest 09/03/2016 FINDINGS: Enlargement of cardiac silhouette, which may be  accentuated by lordotic technique. Mediastinal contours and pulmonary vascularity normal. Lungs clear. No infiltrate, pleural effusion or pneumothorax. Scattered degenerative disc disease changes of the thoracic spine. Impression enlargement of cardiac silhouette. No acute infiltrates. IMPRESSION: No active cardiopulmonary disease. Electronically Signed   By: Lavonia Dana M.D.   On: 02/19/2019 17:18        Scheduled Meds: . atorvastatin  20 mg Oral Daily  . enoxaparin (LOVENOX) injection  40 mg Subcutaneous Daily  . insulin aspart  0-9 Units Subcutaneous TID WC  . pyridostigmine  60 mg Oral TID WC   Continuous Infusions: . cefTRIAXone (ROCEPHIN)  IV 1 g (02/20/19 0930)     LOS: 1 day    Time spent: [place], MD [facility] 02/20/2019, 2:47 PM     Pager [phone] --- please page though AMION:  [email or web address] If 7PM-7AM, please contact night-coverage

## 2019-02-20 NOTE — Progress Notes (Signed)
68 year old male with past medical history of myasthenia gravis, rheumatoid arthritis, diabetes mellitus, hypertension hyperlipidemia.  Patient is on Imuran and Mestinon for his myasthenia gravis.  Patient transferred from [facility] emergency room with fever and aggressive bilateral leg weakness.  Neurology initially consulted for evaluation of myasthenia gravis exacerbation. COVID 19 rule out due to fever, therefore spoke with the patient over the phone with Dr. Loney Loh in the room.    Upon speaking to the patient, patient states that he has been having bilateral knee pain and would have difficulty getting from sitting to standing position.  He denies any weakness of his legs.  Denies any shooting sensation from his back down to his feet.  He has progressively gotten worse and decided to come to the hospital.  He denies fatigue.  He has history of double vision, but denies this getting any worse.  Dr. Loney Loh who examined the patient stated the patient had no ptosis, no difficulty maintaining upward gaze and no shortness of breath.  Able to count greater than 20 and single breath count.  She also stated that he had strength in bilateral lower extremities, however it was limited because of severe pain at bilateral knee joints.  He had normal strength in bilateral upper extremities and no sensory abnormalities.  Patient also denies any difficulty in urinating.  She also noted that both his knees seemed little bit swollen.  Dr. Loney Loh and I feel based on the history and his presentation that this is not myasthenia gravis exacerbation.  Also he has not been having progressive leg weakness, rather severe pain in his knee joints limiting his walking that needs to be evaluated.  Has a urinary tract infection and will be started on antibiotics.  We can hold Imuran first few days until infection is under control, however would continue Mestinon for now.  Please call back if you have any questions.   Neurology will be available as needed.

## 2019-02-20 NOTE — H&P (Addendum)
History and Physical    Jonathan Mcgee PHX:[id number] DOB: [birth year] DOA: 02/19/2019  PCP: Jonathan Flood, MD Patient coming from: [facility] ED  Chief Complaint: Pain in knees  HPI: Jonathan Mcgee is a 68 y.o. male with medical history significant of myasthenia gravis, type 2 diabetes, hypertension, hyperlipidemia, rheumatoid arthritis presented to [facility] ED for evaluation of bilateral lower extremity weakness.  His presentation was thought to be due to an acute flare of myasthenia gravis.  Neurology was consulted and accepted the patient.  Recommended transfer to [facility] under hospitalist service for management of fever.  History was obtained from the patient while neurology was on the phone.  Patient states this past week he has had limited mobility and difficulty getting up from a sitting position due to pain in his knee joints.  Reports chronic pain in his joints from arthritis but now having worsening pain in his knees.  Denies any shooting sensation from his back down to his feet.  Denies any fatigue, shortness of breath, or new double vision.  States he has had double vision before but denies any acute worsening.  States he moved here from [place] a month ago and since then has been in his house.  Denies any recent sick contacts.  Denies having any cough, abdominal pain, nausea, vomiting, or diarrhea.  States he had burning with urination once earlier today but then subsequently had no problems with urination.  Review of Systems: As per HPI otherwise 10 point review of systems negative.  Past Medical History:  Diagnosis Date  . Arthritis   . Diabetes mellitus without complication (HCC)   . Hypercholesterolemia   . Hypertension   . Myasthenia gravis (HCC)   . Rheumatoid arthritis [facility])     Past Surgical History:  Procedure Laterality Date  . KNEE SURGERY Right    fractured patella > 20 yrs ago  . PENILE PROSTHESIS IMPLANT  2015     reports that he has  been smoking cigars. He has been smoking about 1.00 pack per day. He has never used smokeless tobacco. He reports current alcohol use. He reports that he does not use drugs.  Allergies  Allergen Reactions  . Lisinopril Swelling    Facial swelling    Family History  Problem Relation Age of Onset  . Diabetes Mother   . Heart disease Mother   . Hyperlipidemia Mother   . Heart attack Mother   . Hyperlipidemia Father   . Kidney failure Father   . Drug abuse Maternal Grandmother   . Heart disease Maternal Grandmother   . Hypertension Maternal Grandmother   . Hyperlipidemia Maternal Grandmother   . Diabetes Maternal Grandmother     Prior to Admission medications   Medication Sig Start Date End Date Taking? Authorizing Provider  amLODipine (NORVASC) 5 MG tablet TAKE 1 & 1/2 (ONE & ONE-HALF) TABLETS BY MOUTH ONCE DAILY 02/19/19   Jonathan Flood, MD  atorvastatin (LIPITOR) 20 MG tablet Take 1 tablet (20 mg total) by mouth daily. 02/19/19   Jonathan Flood, MD  azaTHIOprine (IMURAN) 50 MG tablet Take 1 tablet (50 mg total) by mouth daily. 01/18/19   Penumalli, Glenford Bayley, MD  hydrochlorothiazide (HYDRODIURIL) 25 MG tablet Take 1 tablet (25 mg total) by mouth daily. 02/19/19   Jonathan Flood, MD  losartan (COZAAR) 100 MG tablet Take 1 tablet (100 mg total) by mouth daily. 02/19/19   Jonathan Flood, MD  metFORMIN (GLUCOPHAGE) 1000 MG  tablet Take 1 tablet (1,000 mg total) by mouth 2 (two) times daily with a meal. 02/19/19   Jonathan FloodGreene, Jeffrey R, MD  pyridostigmine (MESTINON) 60 MG tablet Take 1 tablet (60 mg total) by mouth 3 (three) times daily. 01/18/19   Suanne MarkerPenumalli, Vikram R, MD    Physical Exam: Vitals:   02/19/19 2300 02/20/19 0014 02/20/19 0055 02/20/19 0522  BP:   135/66 136/61  Pulse:   89 74  Resp:      Temp: 99.6 F (37.6 C) (!) 103.5 F (39.7 C) 100.3 F (37.9 C) 99.3 F (37.4 C)  TempSrc:  Oral Oral Oral  SpO2:   97% 99%  Weight:  98.8 kg    Height:  5\' 10"  (1.778 m)       Physical Exam  Constitutional: He is oriented to person, place, and time. He appears well-developed and well-nourished. No distress.  HENT:  Head: Normocephalic.  Mouth/Throat: Oropharynx is clear and moist.  Eyes: Right eye exhibits no discharge. Left eye exhibits no discharge.  Neck: Neck supple.  Cardiovascular: Normal rate, regular rhythm and intact distal pulses.  Pulmonary/Chest: Effort normal and breath sounds normal. No respiratory distress. He has no wheezes. He has no rales.  Abdominal: Soft. Bowel sounds are normal. He exhibits no distension. There is no abdominal tenderness. There is no guarding.  Musculoskeletal:        General: No edema.     Comments: Right knee: Tender to palpation.  Appears swollen compared to the left knee.  No erythema.  Limited range of motion secondary to pain. Left knee: Limited range of motion secondary to pain. Elbow joints: Normal range of motion.  Hip flexor and extensor strength 5 out of 5.  Neurological: He is alert and oriented to person, place, and time. No cranial nerve deficit.  Speech fluent, tongue midline, no facial droop. No vertical nystagmus No ptosis seen on sustained upgaze. No hoarseness of speech. Patient able to take a deep breath and hold it in for 20 seconds without any difficulty. Strength 5 out of 5 in bilateral upper and lower extremities. Strength sensation to light touch intact throughout.  Skin: Skin is warm and dry. He is not diaphoretic.     Labs on Admission: I have personally reviewed following labs and imaging studies  CBC: Recent Labs  Lab 02/19/19 1505 02/19/19 1721 02/20/19 0303  WBC 11.5* 13.1* 8.1  NEUTROABS  --  11.6*  --   HGB 12.4 12.3* 10.2*  HCT 36.2 36.5* 30.4*  MCV 95.3 95.8 94.4  PLT  --  202 193   Basic Metabolic Panel: Recent Labs  Lab 02/19/19 1603 02/19/19 1721 02/20/19 0303  NA 137 135 136  K 4.0 3.6 3.8  CL 95* 99 104  CO2 20 25 23   GLUCOSE 193* 192* 176*  BUN 23 26* 21   CREATININE 1.76* 1.55* 1.73*  CALCIUM 9.3 9.0 8.3*   GFR: Estimated Creatinine Clearance: 48.2 mL/min (A) (by C-G formula based on SCr of 1.73 mg/dL (H)). Liver Function Tests: Recent Labs  Lab 02/19/19 1603 02/19/19 1721  AST 16 17  ALT 11 15  ALKPHOS 83 79  BILITOT 0.5 0.9  PROT 7.4 8.4*  ALBUMIN 4.6 4.1   No results for input(s): LIPASE, AMYLASE in the last 168 hours. No results for input(s): AMMONIA in the last 168 hours. Coagulation Profile: No results for input(s): INR, PROTIME in the last 168 hours. Cardiac Enzymes: No results for input(s): CKTOTAL, CKMB, CKMBINDEX, TROPONINI in the  last 168 hours. BNP (last 3 results) No results for input(s): PROBNP in the last 8760 hours. HbA1C: Recent Labs    02/19/19 1603 02/20/19 0303  HGBA1C 8.5* 8.4*   CBG: Recent Labs  Lab 02/19/19 1711 02/20/19 0321  GLUCAP 174* 163*   Lipid Profile: Recent Labs    02/19/19 1603  CHOL 134  HDL 41  LDLCALC 67  TRIG 128  CHOLHDL 3.3   Thyroid Function Tests: No results for input(s): TSH, T4TOTAL, FREET4, T3FREE, THYROIDAB in the last 72 hours. Anemia Panel: Recent Labs    02/20/19 0303  FERRITIN 146   Urine analysis:    Component Value Date/Time   COLORURINE YELLOW 02/19/2019 2218   APPEARANCEUR CLOUDY (A) 02/19/2019 2218   LABSPEC 1.020 02/19/2019 2218   PHURINE 5.5 02/19/2019 2218   GLUCOSEU NEGATIVE 02/19/2019 2218   HGBUR LARGE (A) 02/19/2019 2218   BILIRUBINUR NEGATIVE 02/19/2019 2218   KETONESUR NEGATIVE 02/19/2019 2218   PROTEINUR 30 (A) 02/19/2019 2218   NITRITE NEGATIVE 02/19/2019 2218   LEUKOCYTESUR TRACE (A) 02/19/2019 2218    Radiological Exams on Admission: Dg Chest 2 View  Result Date: 02/19/2019 CLINICAL DATA:  Fever, pain swelling and redness in both legs, diabetes mellitus, hypertension, myasthenia gravis EXAM: CHEST - 2 VIEW COMPARISON:  CT chest 09/03/2016 FINDINGS: Enlargement of cardiac silhouette, which may be accentuated by lordotic  technique. Mediastinal contours and pulmonary vascularity normal. Lungs clear. No infiltrate, pleural effusion or pneumothorax. Scattered degenerative disc disease changes of the thoracic spine. Impression enlargement of cardiac silhouette. No acute infiltrates. IMPRESSION: No active cardiopulmonary disease. Electronically Signed   By: Ulyses SouthwardMark  Boles M.D.   On: 02/19/2019 17:18    Assessment/Plan Principal Problem:   Sepsis (HCC) Active Problems:   Myasthenia gravis (HCC)   UTI (urinary tract infection)   Suspected Covid-19 Virus Infection   Type 2 diabetes mellitus (HCC)   Sepsis secondary to suspected UTI; rule out COVID-19: T-max 103.5. Not tachycardic.  Blood pressure stable.  White count 13.1 with neutrophilic predominance and lymphopenia.  Lactic acid 2.0.  UA with trace amount of leukocytes, 21-50 RBCs, 6-10 WBCs, many bacteria, and negative nitrite.  Patient reports one episode of dysuria.  Chest x-ray without infiltrates.  Procalcitonin 0.18.  LFTs normal.  Low risk for COVID-19 given no infiltrates on imaging or hypoxia.  There is also suspicion for possible septic joint as he is complaining of pain in his knees and right knee appears swollen exam. Plan: Continue vancomycin and cefepime at this time.  IV fluid.  Blood culture x2 pending.  Urine culture pending.  COVID-19 testing given fevers.  Check influenza panel.  Droplet and contact precautions.  Tylenol PRN.  Continue to monitor CBC.  Lactic acid level pending.  X-ray of knees, will need arthrocentesis if joint effusion present.  Myasthenia gravis; acute flare less likely: Patient sent here from the ED for suspicion of myasthenia gravis flare.  History obtained with neurology on the phone.  His history and presentation are not consistent with myasthenia gravis exacerbation.  He is complaining of limited mobility in his lower extremities due to pain in his knees and right knee appears swollen on exam.  He has decreased range of motion of  his knees secondary to pain.  Not complaining of lower extremity weakness, new diplopia, or shortness of breath.  Strength 5 out of 5 in bilateral upper and lower extremities on exam.  No sensory abnormalities appreciated on exam.  He has no difficulty urinating.  No signs of respiratory distress.  No ptosis or difficulty maintaining upward gaze. Able to count greater than 20 and single breath count.  Plan: X-rays of both knees.  Neurology recommending holding Imdur and for a few days until infection is under control.  Continue Mestinon.  Uncontrolled type 2 diabetes: A1c 8.4.  Sliding scale insulin and CBG checks.  CKD 3: Stable.  Creatinine 1.5, baseline 1.3-1.4. Continue to monitor BMP.  Hyperlipidemia: Continue statin  DVT prophylaxis: Lovenox Code Status: Full code Family Communication: No family available. Disposition Plan: Anticipate discharge after clinical improvement. Consults called: Neurology Admission status: Observation, telemetry  This chart was dictated using voice recognition software.  Despite best efforts to proofread, errors can occur which can change the documentation meaning.  John Giovanni MD [facility] Pager [phone]  If 7PM-7AM, please contact night-coverage [email or web address] Password [facility]  02/20/2019, 7:41 AM

## 2019-02-21 DIAGNOSIS — R6889 Other general symptoms and signs: Secondary | ICD-10-CM

## 2019-02-21 DIAGNOSIS — M0579 Rheumatoid arthritis with rheumatoid factor of multiple sites without organ or systems involvement: Secondary | ICD-10-CM

## 2019-02-21 DIAGNOSIS — E119 Type 2 diabetes mellitus without complications: Secondary | ICD-10-CM

## 2019-02-21 LAB — BASIC METABOLIC PANEL
Anion gap: 9 (ref 5–15)
BUN: 14 mg/dL (ref 8–23)
CALCIUM: 8.6 mg/dL — AB (ref 8.9–10.3)
CO2: 23 mmol/L (ref 22–32)
CREATININE: 1.42 mg/dL — AB (ref 0.61–1.24)
Chloride: 103 mmol/L (ref 98–111)
GFR calc Af Amer: 58 mL/min — ABNORMAL LOW (ref >60)
GFR calc non Af Amer: 50 mL/min — ABNORMAL LOW (ref >60)
Glucose, Bld: 113 mg/dL — ABNORMAL HIGH (ref 70–99)
Potassium: 3.5 mmol/L (ref 3.5–5.1)
SODIUM: 135 mmol/L (ref 135–145)

## 2019-02-21 LAB — URINE CULTURE: Culture: NO GROWTH

## 2019-02-21 LAB — CBC
HCT: 28.8 % — ABNORMAL LOW (ref 39.0–52.0)
Hemoglobin: 10 g/dL — ABNORMAL LOW (ref 13.0–17.0)
MCH: 32.6 pg (ref 26.0–34.0)
MCHC: 34.7 g/dL (ref 30.0–36.0)
MCV: 93.8 fL (ref 80.0–100.0)
Platelets: 191 10*3/uL (ref 150–400)
RBC: 3.07 MIL/uL — ABNORMAL LOW (ref 4.22–5.81)
RDW: 11.6 % (ref 11.5–15.5)
WBC: 8 10*3/uL (ref 4.0–10.5)
nRBC: 0 % (ref 0.0–0.2)

## 2019-02-21 LAB — GLUCOSE, CAPILLARY
Glucose-Capillary: 105 mg/dL — ABNORMAL HIGH (ref 70–99)
Glucose-Capillary: 196 mg/dL — ABNORMAL HIGH (ref 70–99)

## 2019-02-21 MED ORDER — CEPHALEXIN 500 MG PO CAPS: 500.0000 mg | ORAL_CAPSULE | Freq: Three times a day (TID) | ORAL | 0 refills | Status: AC

## 2019-02-21 MED ORDER — PREDNISONE 5 MG PO TABS: ORAL_TABLET | ORAL | 0 refills | Status: DC

## 2019-02-21 NOTE — Discharge Summary (Signed)
Physician Discharge Summary  Jonathan Mcgee SHF:[id number] DOB: 05/09/1951 DOA: 02/19/2019  PCP: Wendie Agreste, MD  Admit date: 02/19/2019 Discharge date: 02/21/2019  Admitted From: Home  Disposition:  Home   Recommendations for Outpatient Follow-up:  1. Follow up with PCP in 1-2 weeks 2. Follow up with Rheumatology in 3-4 weeks 3. Please follow up on the following pending results: Novel coronavirus NAA 4. Please obtain radiographs of hands, wrists, elbows, knees     Home Health: None  Equipment/Devices: None  Discharge Condition: Good  CODE STATUS: FULL Diet recommendation: Diabetic  Brief/Interim Summary: Jonathan Mcgee is a 68 y.o. M with MG recently started Imuran, HTN, and DM who presents with bilateral knee pain and warmth, hand pain and elbow pain and warmth.  Patient was in [place] until several days ago when he had insidious onset bilateral knee pain.  This pain was associated with difficulty standing from pain, and also with bilateral pain and stiffness of the small joints of both hands.  It progressed to both elbows, and so he saw his PCP who referred him to the ER.  In the ER, he had temp 102F, fever, pyuria and bacteria in the urine.  CXR clear.  No hypoxia.  He was tested for CoV and admitted on broad spectrum antibiotics.  Neurology were consulted for ?myasthenia flare.        PRINCIPAL HOSPITAL DIAGNOSIS: Sepsis from UTI, presumed    Discharge Diagnoses:   Rheumatoid arthritis of hands, wrists, elbows, knees He reports a 1 year history of episodic, relapsing bilateral small joint swelling, warmth and pain/stiffness.  This came in waves lasting days to weeks, self-treated with OTC pain medication.    Had had a negative RF but strongly positive anti-CCP Ab titer (185 units) last April and been referred to Rheum but never made it.  Never any DMARD.   Now, in the last week, he developed again symmetric polyarthralgia, worse than before, worst in knees  bilaterally, with warmth of bilateral knees, elbows, tenderness of all DIPs/PIPs.    Went to PCP where ESR >100, CRP >9.  RF obtained by PCP yesterday and elevated 18 IU/ml.  Anti-CCP also strongly positive.   Discussed by phone with [facility] Rheumatology, they recommend prednisone taper: 10 mg 1 week then 5 mg 1 week and outpatient Rheum follow up.       Presumed UTI sepsis Simultaneously, patient did present with fever, leukocytosis.  Lactate 2.0 mmol/L.  CXR clear but UA with bacteria and pyuria (in diabetic).  His Imuran was held.  Blood cultures negative, urine culture after Abx also unfortunately negative.  He was treated with vancomycin and cefepime x1, narrowed to ceftriaxone for 2 days, then discharged to complete 7 days total Abx with cephalexin.     Coronavirus rule out Had recent travel history and so was screened for SARS-CoV-2.  Test pending at time of discharge.  Given self-isolation instructions.     Myasthenia gravis Neurology were consulted.  Given lack of bulbar symptoms, this effectively rules out myasthenia flare or crisis.  Subsequently, patient more consistent in stating that his leg "weakness' is actually limitation from pain.  Diabetes  Hypertension   Normocytic anemia Hgb 10, this is new  CKD stage III Baseline Cr 1.4-1.7, stable relative to baseline             Discharge Instructions  Discharge Instructions    Ambulatory referral to Rheumatology   Complete by:  As directed    Refer to [facility]  [facility] Rheumatology at [phone]   Diet Carb Modified   Complete by:  As directed    Discharge instructions   Complete by:  As directed    From Dr. Loleta Books: You were admitted for fever and concern for an infection. Because your lab findings at the time of admission (an elevated white blood cell count, elevated lactic acid), indicated a possible serious bacterial infection, you were started on broad spectrum antibiotics at that  time. Your blood cultures and chest x-ray showed no signs of infection, and your only symptoms that localized the infection were to a bladder infection (a "UTI").  As we discussed, your urine culture had no growth, but I still think it reasonable and prudent for you to finish a short course of antibiotics to treat for presumed urine infection.  Take cephalexin/Keflex 500 mg three times daily for the next 5 days then stop  Until you finish antibiotics, hold off on Imuran. Call Dr. Gladstone Lighter office to ask them, when you restart the medication, whether you should restart at the previous dose or not.  I will send him a message as well.  I have prescribed a short course of prednisone. Once you have finished the antibiotics, start prednisone 10 mg once daily for one week, then prednisone 5 mg once daily for 1 week then stop. While you are on prednisone, check your blood sugar twice a day. If you sugar is over 300, call Dr. Vonna Kotyk office for instructions.  I will send him a message to watch out for this. I have sent an electronic referral to [facility] Rheumatology.  Their number (and I would ask you to follow up after Tuesday) is: [phone]  If you call [facility], you can tell them that your doctor at [facility] spoke with the Rheumatology fellow on Sat Mar 28th (I believe it was Dr. Sharlette Dense?), and that she recommended an office visit follow up    Until your coronavirus test result comes back, please be in touch at least every other day with Dr. Vonna Kotyk office to let them know how you are doing.  I will message them to reach out to you too.   If there are questions you need answered from me, the best way to reach me is [phone]. That is [facility] [facility].  If the staff there tell you that they can't reach me because I'm not in the office, you can tell them that I specifically asked that they call me.   Increase activity slowly   Complete by:  As directed       Allergies as of 02/21/2019      Reactions   Lisinopril Swelling   Facial swelling      Medication List    STOP taking these medications   azaTHIOprine 50 MG tablet Commonly known as:  IMURAN     TAKE these medications   amLODipine 5 MG tablet Commonly known as:  NORVASC TAKE 1 & 1/2 (ONE & ONE-HALF) TABLETS BY MOUTH ONCE DAILY   atorvastatin 20 MG tablet Commonly known as:  LIPITOR Take 1 tablet (20 mg total) by mouth daily.   cephALEXin 500 MG capsule Commonly known as:  KEFLEX Take 1 capsule (500 mg total) by mouth 3 (three) times daily for 5 days.   hydrochlorothiazide 25 MG tablet Commonly known as:  HYDRODIURIL Take 1 tablet (25 mg total) by mouth daily.   losartan 100 MG tablet Commonly known as:  COZAAR Take 1 tablet (100 mg total)  by mouth daily.   metFORMIN 1000 MG tablet Commonly known as:  GLUCOPHAGE Take 1 tablet (1,000 mg total) by mouth 2 (two) times daily with a meal.   predniSONE 5 MG tablet Commonly known as:  DELTASONE Take 10 mg (2 tabs) daily for 7 days then take 5 mg (1 tab) daily for 7 days then stop.   pyridostigmine 60 MG tablet Commonly known as:  MESTINON Take 1 tablet (60 mg total) by mouth 3 (three) times daily.       Allergies  Allergen Reactions  . Lisinopril Swelling    Facial swelling    Consultations:  Neurology  Rheumatology by phone   Procedures/Studies: Dg Chest 2 View  Result Date: 02/19/2019 CLINICAL DATA:  Fever, pain swelling and redness in both legs, diabetes mellitus, hypertension, myasthenia gravis EXAM: CHEST - 2 VIEW COMPARISON:  CT chest 09/03/2016 FINDINGS: Enlargement of cardiac silhouette, which may be accentuated by lordotic technique. Mediastinal contours and pulmonary vascularity normal. Lungs clear. No infiltrate, pleural effusion or pneumothorax. Scattered degenerative disc disease changes of the thoracic spine. Impression enlargement of cardiac silhouette. No acute infiltrates. IMPRESSION: No  active cardiopulmonary disease. Electronically Signed   By: Lavonia Dana M.D.   On: 02/19/2019 17:18       Subjective: Feeling well.  Joint pains stable.  No more dysuria.  No fever, hypoxia, respiratory distress, cough, malaise, myalgias.    Discharge Exam: Vitals:   02/20/19 1624 02/21/19 0848  BP: (!) 130/57 131/77  Pulse: 71 62  Resp:  16  Temp: 99 F (37.2 C) 99 F (37.2 C)  SpO2: 99% 96%   Vitals:   02/20/19 0926 02/20/19 1100 02/20/19 1624 02/21/19 0848  BP:   (!) 130/57 131/77  Pulse:   71 62  Resp:    16  Temp: (!) 101.6 F (38.7 C) 99.1 F (37.3 C) 99 F (37.2 C) 99 F (37.2 C)  TempSrc: Oral Oral Oral Oral  SpO2:   99% 96%  Weight:      Height:        General: Pt is alert, awake, not in acute distress Cardiovascular: RRR, nl S1-S2, no murmurs appreciated.   No LE edema.   Respiratory: Normal respiratory rate and rhythm.  CTAB without rales or wheezes. Abdominal: Abdomen soft and non-tender.  No distension or HSM.   Neuro/Psych: Strength symmetric in upper and lower extremities.  Judgment and insight appear normal.   The results of significant diagnostics from this hospitalization (including imaging, microbiology, ancillary and laboratory) are listed below for reference.     Microbiology: Recent Results (from the past 240 hour(s))  Blood culture (routine x 2)     Status: None (Preliminary result)   Collection Time: 02/19/19  5:20 PM  Result Value Ref Range Status   Specimen Description   Final    BLOOD RIGHT ARM Performed at [facility], [address] [place], [place] [address]    Special Requests   Final    BOTTLES DRAWN AEROBIC AND ANAEROBIC Blood Culture adequate volume Performed at [facility], [address] [place], [place] [address]    Culture   Final    NO GROWTH < 24 HOURS Performed at [facility], [place] [address] [place], [place] [address]    Report Status PENDING  Incomplete  Blood culture (routine x  2)     Status: None (Preliminary result)   Collection Time: 02/19/19  5:42 PM  Result  Value Ref Range Status   Specimen Description   Final    BLOOD LEFT ARM Performed at [facility], [address] [place], [place] [address]    Special Requests   Final    BOTTLES DRAWN AEROBIC AND ANAEROBIC Blood Culture adequate volume Performed at [facility], [address] [place], [place] [address]    Culture   Final    NO GROWTH < 24 HOURS Performed at [facility], [place] [address] [place], [place] [address]    Report Status PENDING  Incomplete  Urine culture     Status: None   Collection Time: 02/19/19 10:16 PM  Result Value Ref Range Status   Specimen Description   Final    URINE, RANDOM Performed at [facility], [place] [place], [place] [place] [address]    Special Requests   Final    NONE Performed at [facility], [address] [place], [place] [address]    Culture   Final    NO GROWTH Performed at [facility], [place] [address], [place], [place] [address]    Report Status 02/21/2019 FINAL  Final  MRSA PCR Screening     Status: None   Collection Time: 02/20/19  1:17 AM  Result Value Ref Range Status   MRSA by PCR NEGATIVE NEGATIVE Final    Comment:        The GeneXpert MRSA Assay (FDA approved for NASAL specimens only), is one component of a comprehensive MRSA colonization surveillance program. It is not intended to diagnose MRSA infection nor to guide or monitor treatment for MRSA infections. Performed at [facility], [place] [address] [place], [place] [address]      Labs: BNP (last 3 results) No results for input(s): BNP in the last 8760 hours. Basic Metabolic Panel: Recent Labs  Lab 02/19/19 1603 02/19/19 1721 02/20/19 0303 02/21/19 0244  NA 137 135 136 135  K 4.0 3.6 3.8 3.5  CL 95* 99 104 103  CO2 20 25 23 23   GLUCOSE 193* 192* 176* 113*  BUN 23 26* 21 14  CREATININE 1.76* 1.55* 1.73*  1.42*  CALCIUM 9.3 9.0 8.3* 8.6*   Liver Function Tests: Recent Labs  Lab 02/19/19 1603 02/19/19 1721  AST 16 17  ALT 11 15  ALKPHOS 83 79  BILITOT 0.5 0.9  PROT 7.4 8.4*  ALBUMIN 4.6 4.1   No results for input(s): LIPASE, AMYLASE in the last 168 hours. No results for input(s): AMMONIA in the last 168 hours. CBC: Recent Labs  Lab 02/19/19 1505 02/19/19 1721 02/20/19 0303 02/21/19 0244  WBC 11.5* 13.1* 8.1 8.0  NEUTROABS  --  11.6*  --   --   HGB 12.4 12.3* 10.2* 10.0*  HCT 36.2 36.5* 30.4* 28.8*  MCV 95.3 95.8 94.4 93.8  PLT  --  202 193 191   Cardiac Enzymes: No results for input(s): CKTOTAL, CKMB, CKMBINDEX, TROPONINI in the last 168 hours. BNP: Invalid input(s): POCBNP CBG: Recent Labs  Lab 02/20/19 1237 02/20/19 1622 02/20/19 2154 02/21/19 0841 02/21/19 1137  GLUCAP 195* 171* 129* 105* 196*   D-Dimer No results for input(s): DDIMER in the last 72 hours. Hgb A1c Recent Labs    02/19/19 1603 02/20/19 0303  HGBA1C 8.5* 8.4*   Lipid Profile Recent Labs    02/19/19 1603  CHOL 134  HDL 41  LDLCALC 67  TRIG 128  CHOLHDL 3.3  Thyroid function studies No results for input(s): TSH, T4TOTAL, T3FREE, THYROIDAB in the last 72 hours.  Invalid input(s): FREET3 Anemia work up Recent Labs    02/20/19 0303  FERRITIN 146   Urinalysis    Component Value Date/Time   COLORURINE YELLOW 02/19/2019 2218   APPEARANCEUR CLOUDY (A) 02/19/2019 2218   LABSPEC 1.020 02/19/2019 2218   PHURINE 5.5 02/19/2019 2218   GLUCOSEU NEGATIVE 02/19/2019 2218   HGBUR LARGE (A) 02/19/2019 2218   [place] NEGATIVE 02/19/2019 2218   [place] NEGATIVE 02/19/2019 2218   PROTEINUR 30 (A) 02/19/2019 2218   NITRITE NEGATIVE 02/19/2019 2218   LEUKOCYTESUR TRACE (A) 02/19/2019 2218   Sepsis Labs Invalid input(s): PROCALCITONIN,  WBC,  LACTICIDVEN Microbiology Recent Results (from the past 240 hour(s))  Blood culture (routine x 2)     Status: None (Preliminary result)    Collection Time: 02/19/19  5:20 PM  Result Value Ref Range Status   Specimen Description   Final    BLOOD RIGHT ARM Performed at [facility], [address] [place], [place] [address]    Special Requests   Final    BOTTLES DRAWN AEROBIC AND ANAEROBIC Blood Culture adequate volume Performed at [facility], [address] [place], [place] [address]    Culture   Final    NO GROWTH < 24 HOURS Performed at [facility], [place] [address] [place], [place] [address]    Report Status PENDING  Incomplete  Blood culture (routine x 2)     Status: None (Preliminary result)   Collection Time: 02/19/19  5:42 PM  Result Value Ref Range Status   Specimen Description   Final    BLOOD LEFT ARM Performed at [facility], [address] [place], [place] [address]    Special Requests   Final    BOTTLES DRAWN AEROBIC AND ANAEROBIC Blood Culture adequate volume Performed at [facility], [address] [place], [place] [address]    Culture   Final    NO GROWTH < 24 HOURS Performed at [facility], [place] [address], [place], [place] [address]    Report Status PENDING  Incomplete  Urine culture     Status: None   Collection Time: 02/19/19 10:16 PM  Result Value Ref Range Status   Specimen Description   Final    URINE, RANDOM Performed at [facility], [address] [place], [place] [address]    Special Requests   Final    NONE Performed at [facility], [address] [place], [place] [address]    Culture   Final    NO GROWTH Performed at [facility], [place] [address] [place], [place] [address]    Report Status 02/21/2019 FINAL  Final  MRSA PCR Screening     Status: None   Collection Time: 02/20/19  1:17 AM  Result Value Ref Range Status   MRSA by PCR NEGATIVE NEGATIVE Final    Comment:        The GeneXpert MRSA Assay (FDA approved for NASAL specimens only), is one component of a comprehensive  MRSA colonization surveillance program. It is not intended to diagnose MRSA infection nor to guide or monitor treatment for MRSA infections. Performed at [facility], [place] [address], [place], [place] [address]      Time coordinating discharge: 25 minutes  SIGNED:   Edwin Dada, MD  [facility] 02/21/2019, 12:44 PM

## 2019-02-22 LAB — COMPREHENSIVE METABOLIC PANEL
ALT: 11 IU/L (ref 0–44)
AST: 16 IU/L (ref 0–40)
Albumin/Globulin Ratio: 1.6 (ref 1.2–2.2)
Albumin: 4.6 g/dL (ref 3.8–4.8)
Alkaline Phosphatase: 83 IU/L (ref 39–117)
BUN / CREAT RATIO: 13 (ref 10–24)
BUN: 23 mg/dL (ref 8–27)
Bilirubin Total: 0.5 mg/dL (ref 0.0–1.2)
CO2: 20 mmol/L (ref 20–29)
Calcium: 9.3 mg/dL (ref 8.6–10.2)
Chloride: 95 mmol/L — ABNORMAL LOW (ref 96–106)
Creatinine, Ser: 1.76 mg/dL — ABNORMAL HIGH (ref 0.76–1.27)
GFR calc Af Amer: 45 mL/min/{1.73_m2} — ABNORMAL LOW (ref >59)
GFR calc non Af Amer: 39 mL/min/{1.73_m2} — ABNORMAL LOW (ref >59)
GLUCOSE: 193 mg/dL — AB (ref 65–99)
Globulin, Total: 2.8 g/dL (ref 1.5–4.5)
Potassium: 4 mmol/L (ref 3.5–5.2)
Sodium: 137 mmol/L (ref 134–144)
Total Protein: 7.4 g/dL (ref 6.0–8.5)

## 2019-02-22 LAB — LIPID PANEL
Chol/HDL Ratio: 3.3 ratio (ref 0.0–5.0)
Cholesterol, Total: 134 mg/dL (ref 100–199)
HDL: 41 mg/dL (ref >39)
LDL Calculated: 67 mg/dL (ref 0–99)
Triglycerides: 128 mg/dL (ref 0–149)
VLDL Cholesterol Cal: 26 mg/dL (ref 5–40)

## 2019-02-22 LAB — ANA,IFA RA DIAG PNL W/RFLX TIT/PATN
ANA Titer 1: NEGATIVE
CYCLIC CITRULLIN PEPTIDE AB: 147 U — AB (ref 0–19)
Rheumatoid fact SerPl-aCnc: 18.4 IU/mL — ABNORMAL HIGH (ref 0.0–13.9)

## 2019-02-22 LAB — HEMOGLOBIN A1C
Est. average glucose Bld gHb Est-mCnc: 197 mg/dL
Hgb A1c MFr Bld: 8.5 % — ABNORMAL HIGH (ref 4.8–5.6)

## 2019-02-22 LAB — SEDIMENTATION RATE: Sed Rate: 52 mm/hr — ABNORMAL HIGH (ref 0–30)

## 2019-02-23 ENCOUNTER — Telehealth: Payer: Self-pay | Admitting: *Deleted

## 2019-02-23 ENCOUNTER — Encounter: Payer: Self-pay | Admitting: *Deleted

## 2019-02-23 LAB — NOVEL CORONAVIRUS, NAA (HOSP ORDER, SEND-OUT TO REF LAB; TAT 18-24 HRS)

## 2019-02-23 LAB — NOVEL CORONAVIRUS, NAA (HOSPITAL ORDER, SEND-OUT TO REF LAB): SARS-COV-2, NAA: NOT DETECTED

## 2019-02-23 NOTE — Telephone Encounter (Signed)
Per Dr Marjory Lies, called patient and advised him Dr Marjory Lies received message from provider in hospital re: Imuran stopped, and he would like a virtual FU scheduled.  Due to current COVID 19 pandemic, our office is severely reducing in person visits in order to minimize the risk to our patients and healthcare providers.  We'll take all precautions to reduce any security or privacy concerns. This will be treated like an office visit, and we will file with your insurance. There may be a patient responsible charge related to this service." The patient stated he already has Webex on his work [employer], will send e mail via my chart.  Will wait for e mail before scheduling Webex meeting but have put him on Dr [facility] schedule for tomorrow at 3:00 pm. Patient verbalized understanding, appreciation.

## 2019-02-24 ENCOUNTER — Encounter: Payer: Self-pay | Admitting: Diagnostic Neuroimaging

## 2019-02-24 ENCOUNTER — Ambulatory Visit (INDEPENDENT_AMBULATORY_CARE_PROVIDER_SITE_OTHER): Payer: Medicare PPO | Admitting: Diagnostic Neuroimaging

## 2019-02-24 ENCOUNTER — Other Ambulatory Visit: Payer: Self-pay

## 2019-02-24 DIAGNOSIS — G7000 Myasthenia gravis without (acute) exacerbation: Secondary | ICD-10-CM

## 2019-02-24 LAB — CULTURE, BLOOD (ROUTINE X 2)
Culture: NO GROWTH
Culture: NO GROWTH
Special Requests: ADEQUATE
Special Requests: ADEQUATE

## 2019-02-24 NOTE — Telephone Encounter (Signed)
Received my chart message. Webex scheduled, e mailed to patient: [email or web address]

## 2019-02-24 NOTE — Progress Notes (Signed)
[facility]  PATIENT: Jonathan Mcgee DOB: [birth year]  REFERRING CLINICIAN: Deliah BostonMichael Clark, PA-c HISTORY FROM: patient  REASON FOR VISIT: follow up (VIA VIDEO)   HISTORICAL  CHIEF COMPLAINT:  Chief Complaint  Patient presents with  . Myasthenia Gravis    HISTORY OF PRESENT ILLNESS:   UPDATE (02/24/19, VRP): Since last visit, had started on imuran 1 month ago. Then had fevers, pyuria, and weakness. Also had significant knee pain, and admitted to hospital (March 2020). Dx'd with rheumatoid arthritis and UTI; imuran was stopped.  No focal or localized signs to suggest myasthenia gravis sedation or crisis.  Prednisone 10 mg was started and patient was referred to rheumatology outpatient.  Of note patient was tested for flu and nCoV both which were negative.  Patient now back home.  He is doing well.  No fevers.  He is completing his antibiotic course.  He is on prednisone 10 mg daily.  Pain is subsided. MG symptoms are stable since last visit. Breathing, chewing, swallowing stable.   UPDATE (01/18/19, VRP): Since last visit, doing slightly worse. Mild weakness in left leg. Slightly mild generalized fatigue and weakness. Worse with exertion. Symptoms are worse than 2019. No speech, swallow or breathing issues. Tolerating mestinon.   UPDATE (04/15/18, VRP): Since last visit, doing about the same with MG. Tolerating mestinon. No alleviating or aggravating factors. [place]  UPDATE 07/16/17: Since last visit, overall stable. Mild generalized fatigue continues. Overall stable. Notices some more muscle strain.   UPDATE 02/12/17: Since last visit, BP is better (meds have been adjusted). Now has CPAP machine (about to start). Sugars are better now (averaging ~150's). Intermittent double vision continues. Mild generalized fatigue, but no focal weakness.   UPDATE 12/31/16: Since last visit, IVIG completed, and general energy little better, but muscle  cramps worse. BP running high. He has not checked sugars lately. PSG showed mod-severe OSA, and CPAP titration study is pending. Tolerating mestinon 60mg  BID.   UPDATE 10/09/16: Since last visit, double vision is slightly better on mestinon 60mg  TID. However having more muscle cramps, excess saliva production. Fatigable weakness is persistent.   PRIOR HPI (08/28/16): 68 year old ambidextrous male with hypertension, diabetes, hypercholesterolemia here for evaluation of myasthenia gravis. For past 1-2 years patient has had mild intermittent right-sided ptosis. In May 2017 he noticed onset of double vision, blurred vision, wavy vision. This is progressively worsened. For past 1 month he has had increasing fatigue with walking. Is having difficulty with upper and lower extremity strength. He denies any speech, swallowing, breathing difficulty. His balance has been slightly off. Patient went to PCP, ophthalmology, had [facility] antibody testing which confirmed diagnosis of myasthenia gravis.   REVIEW OF SYSTEMS: Full 14 system review of systems performed and negative with exception of: as per HPI.   ALLERGIES: Allergies  Allergen Reactions  . Lisinopril Swelling    Facial swelling    HOME MEDICATIONS: Outpatient Medications Prior to Visit  Medication Sig Dispense Refill  . amLODipine (NORVASC) 5 MG tablet TAKE 1 & 1/2 (ONE & ONE-HALF) TABLETS BY MOUTH ONCE DAILY 90 tablet 1  . atorvastatin (LIPITOR) 20 MG tablet Take 1 tablet (20 mg total) by mouth daily. 90 tablet 1  . cephALEXin (KEFLEX) 500 MG capsule Take 1 capsule (500 mg total) by mouth 3 (three) times daily for 5 days. 15 capsule 0  . hydrochlorothiazide (HYDRODIURIL) 25 MG tablet Take 1 tablet (25 mg total) by mouth daily. 90 tablet 1  .  losartan (COZAAR) 100 MG tablet Take 1 tablet (100 mg total) by mouth daily. 90 tablet 1  . metFORMIN (GLUCOPHAGE) 1000 MG tablet Take 1 tablet (1,000 mg total) by mouth 2 (two) times daily with a meal. 180  tablet 1  . predniSONE (DELTASONE) 5 MG tablet Take 10 mg (2 tabs) daily for 7 days then take 5 mg (1 tab) daily for 7 days then stop. 21 tablet 0  . pyridostigmine (MESTINON) 60 MG tablet Take 1 tablet (60 mg total) by mouth 3 (three) times daily. 180 tablet 4   No facility-administered medications prior to visit.     PAST MEDICAL HISTORY: Past Medical History:  Diagnosis Date  . Arthritis   . Diabetes mellitus without complication (HCC)   . Hypercholesterolemia   . Hypertension   . Myasthenia gravis (HCC)   . Rheumatoid arthritis (HCC)     PAST SURGICAL HISTORY: Past Surgical History:  Procedure Laterality Date  . KNEE SURGERY Right    fractured patella > 20 yrs ago  . PENILE PROSTHESIS IMPLANT  2015    FAMILY HISTORY: Family History  Problem Relation Age of Onset  . Diabetes Mother   . Heart disease Mother   . Hyperlipidemia Mother   . Heart attack Mother   . Hyperlipidemia Father   . Kidney failure Father   . Drug abuse Maternal Grandmother   . Heart disease Maternal Grandmother   . Hypertension Maternal Grandmother   . Hyperlipidemia Maternal Grandmother   . Diabetes Maternal Grandmother     SOCIAL HISTORY:  Social History   Socioeconomic History  . Marital status: Single    Spouse name: Not on file  . Number of children: 1  . Years of education: 56  . Highest education level: Not on file  Occupational History    Comment: [employer]  Social Needs  . Financial resource strain: Not on file  . Food insecurity:    Worry: Not on file    Inability: Not on file  . Transportation needs:    Medical: Not on file    Non-medical: Not on file  Tobacco Use  . Smoking status: Current Some Day Smoker    Packs/day: 1.00    Types: Cigars  . Smokeless tobacco: Never Used  Substance and Sexual Activity  . Alcohol use: Yes    Comment: occas  . Drug use: No  . Sexual activity: Not on file  Lifestyle  . Physical activity:    Days per week: Not on file    Minutes per  session: Not on file  . Stress: Not on file  Relationships  . Social connections:    Talks on phone: Not on file    Gets together: Not on file    Attends religious service: Not on file    Active member of club or organization: Not on file    Attends meetings of clubs or organizations: Not on file    Relationship status: Not on file  . Intimate partner violence:    Fear of current or ex partner: Not on file    Emotionally abused: Not on file    Physically abused: Not on file    Forced sexual activity: Not on file  Other Topics Concern  . Not on file  Social History Narrative   Drinks 2-3 caffeine drinks a day      PHYSICAL EXAM  Video exam  GENERAL EXAM/CONSTITUTIONAL: Vitals:  There were no vitals filed for this visit.  Wt Readings from  Last 3 Encounters:  02/20/19 217 lb 13 oz (98.8 kg)  02/19/19 220 lb (99.8 kg)  01/18/19 218 lb 9.6 oz (99.2 kg)   There is no height or weight on file to calculate BMI. No exam data present  Patient is in no distress; well developed, nourished and groomed; neck is supple  NEUROLOGIC: MENTAL STATUS:  No flowsheet data found.  awake, alert, oriented to person, place and time  recent and remote memory intact  normal attention and concentration  language fluent, comprehension intact, naming intact,   fund of knowledge appropriate  CRANIAL NERVE:   2nd, 3rd, 4th, 6th - pupils equal and reactive to light, visual fields full to confrontation, extraocular muscles --> NO DOUBLE VISION CURRENTLY; no nystagmus  5th - facial sensation symmetric  7th - facial strength symmetric --> EXCEPT MILD LEFT EYEBROW RAISE WEAKNESS  8th - hearing intact  9th - palate elevates symmetrically, uvula midline  11th - shoulder shrug symmetric  12th - tongue protrusion midline  NO DYSARTHRIA  MOTOR:   NO DRIFT IN BUE  COORDINATION:   fine finger movements normal     DIAGNOSTIC DATA (LABS, IMAGING, TESTING) - I reviewed patient  records, labs, notes, testing and imaging myself where available.  Lab Results  Component Value Date   WBC 8.0 02/21/2019   HGB 10.0 (L) 02/21/2019   HCT 28.8 (L) 02/21/2019   MCV 93.8 02/21/2019   PLT 191 02/21/2019      Component Value Date/Time   NA 135 02/21/2019 0244   NA 137 02/19/2019 1603   K 3.5 02/21/2019 0244   CL 103 02/21/2019 0244   CO2 23 02/21/2019 0244   GLUCOSE 113 (H) 02/21/2019 0244   BUN 14 02/21/2019 0244   BUN 23 02/19/2019 1603   CREATININE 1.42 (H) 02/21/2019 0244   CREATININE 1.39 (H) 06/29/2016 1626   CALCIUM 8.6 (L) 02/21/2019 0244   PROT 8.4 (H) 02/19/2019 1721   PROT 7.4 02/19/2019 1603   ALBUMIN 4.1 02/19/2019 1721   ALBUMIN 4.6 02/19/2019 1603   AST 17 02/19/2019 1721   ALT 15 02/19/2019 1721   ALKPHOS 79 02/19/2019 1721   BILITOT 0.9 02/19/2019 1721   BILITOT 0.5 02/19/2019 1603   GFRNONAA 50 (L) 02/21/2019 0244   GFRNONAA 53 (L) 06/29/2016 1626   GFRAA 58 (L) 02/21/2019 0244   GFRAA 61 06/29/2016 1626   Lab Results  Component Value Date   CHOL 134 02/19/2019   HDL 41 02/19/2019   LDLCALC 67 02/19/2019   TRIG 128 02/19/2019   CHOLHDL 3.3 02/19/2019   Hemoglobin A1C  Date Value Ref Range Status  10/24/2018 7.5 (A) 4.0 - 5.6 % Final  03/24/2018 8.9  Final   Hgb A1c MFr Bld  Date Value Ref Range Status  02/20/2019 8.4 (H) 4.8 - 5.6 % Final    Comment:    (NOTE) Pre diabetes:          5.7%-6.4% Diabetes:              >6.4% Glycemic control for   <7.0% adults with diabetes   02/19/2019 8.5 (H) 4.8 - 5.6 % Final    Comment:             Prediabetes: 5.7 - 6.4          Diabetes: >6.4          Glycemic control for adults with diabetes: <7.0   07/19/2017 6.5 (H) 4.8 - 5.6 % Final  Comment:             Prediabetes: 5.7 - 6.4          Diabetes: >6.4          Glycemic control for adults with diabetes: <7.0     No results found for: VITAMINB12 Lab Results  Component Value Date   TSH 3.650 10/24/2018    07/04/16 CT  head [I reviewed images myself and agree with interpretation. -VRP]  - Mild microvascular disease without acute intracranial process.  09/04/16 CT chest  1. No mediastinal mass or adenopathy. 2. No infiltrate or pulmonary edema. 3. Degenerative changes thoracic spine. 4. Atherosclerotic calcifications of thoracic aorta. 5. Mild fatty infiltration of the liver.  12/31/16 LABS TPMT Activity: Units/mL RBC 31.8   Comment: Reference Range:  Normal: 15.1 - 26.4  Heterozygous for low TPMT variant: 6.3 - 15.0  Homozygous for low TPMT variant: <6.3        ASSESSMENT AND PLAN  68 y.o. year old male here with new onset diagnosis of myasthenia gravis with ocular and generalized features. Was having side effects from mestinon, but also some improvement in ocular symptoms. IVIG has helped generalized symptoms.    Dx: (ocular + generalized) myasthenia gravis  1. Myasthenia gravis [facility])     Virtual Visit via Video Note  I connected with Jonathan Mcgee on 02/24/19 at  3:00 PM EDT by a video enabled telemedicine application and verified that I am speaking with the correct person using two identifiers.   I discussed the limitations of evaluation and management by telemedicine and the availability of in person appointments. The patient expressed understanding and agreed to proceed.   I discussed the assessment and treatment plan with the patient. The patient was provided an opportunity to ask questions and all were answered. The patient agreed with the plan and demonstrated an understanding of the instructions.   The patient was advised to call back or seek an in-person evaluation if the symptoms worsen or if the condition fails to improve as anticipated.  I provided 30 minutes of non-face-to-face time during this encounter.    PLAN:   MYASTHENIA GRAVIS  - continue pyridostigmine 60mg  3x per day  - generalized myasthenia gravis symptoms are stable; will stay off imuran due to recent infx  and hospitalization; would not recommend to restart imuran at this time (due to infx within 1 month of starting; and current COVID 19 epidemic).  - complete prednisone taper; follow up with rheumatology; could consider prednisone 20mg  daily (in future; this could help with RA and MG; would need close BP and DM monitoring and control)  - continue BP and DM control  - continue CPAP usage  Return in about 3 months (around 05/26/2019).   Suanne Marker, MD 02/24/2019, 3:31 PM Certified in Neurology, Neurophysiology and Neuroimaging  [facility] [address], [address] [place], [place] [address] [phone]

## 2019-04-14 DIAGNOSIS — H25012 Cortical age-related cataract, left eye: Secondary | ICD-10-CM | POA: Diagnosis not present

## 2019-04-14 DIAGNOSIS — H25812 Combined forms of age-related cataract, left eye: Secondary | ICD-10-CM | POA: Diagnosis not present

## 2019-04-14 DIAGNOSIS — H2512 Age-related nuclear cataract, left eye: Secondary | ICD-10-CM | POA: Diagnosis not present

## 2019-07-19 ENCOUNTER — Encounter: Payer: Self-pay | Admitting: Diagnostic Neuroimaging

## 2019-07-19 ENCOUNTER — Other Ambulatory Visit: Payer: Self-pay | Admitting: Family Medicine

## 2019-07-19 ENCOUNTER — Other Ambulatory Visit: Payer: Self-pay

## 2019-07-19 ENCOUNTER — Ambulatory Visit (INDEPENDENT_AMBULATORY_CARE_PROVIDER_SITE_OTHER): Payer: BLUE CROSS/BLUE SHIELD | Admitting: Diagnostic Neuroimaging

## 2019-07-19 VITALS — BP 131/67 | HR 58 | Temp 96.2°F | Ht 69.0 in | Wt 204.9 lb

## 2019-07-19 DIAGNOSIS — M2550 Pain in unspecified joint: Secondary | ICD-10-CM | POA: Diagnosis not present

## 2019-07-19 DIAGNOSIS — G4733 Obstructive sleep apnea (adult) (pediatric): Secondary | ICD-10-CM | POA: Diagnosis not present

## 2019-07-19 DIAGNOSIS — E785 Hyperlipidemia, unspecified: Secondary | ICD-10-CM

## 2019-07-19 DIAGNOSIS — G7000 Myasthenia gravis without (acute) exacerbation: Secondary | ICD-10-CM | POA: Diagnosis not present

## 2019-07-19 DIAGNOSIS — E1122 Type 2 diabetes mellitus with diabetic chronic kidney disease: Secondary | ICD-10-CM

## 2019-07-19 DIAGNOSIS — Z9989 Dependence on other enabling machines and devices: Secondary | ICD-10-CM

## 2019-07-19 DIAGNOSIS — I10 Essential (primary) hypertension: Secondary | ICD-10-CM

## 2019-07-19 MED ORDER — AMLODIPINE BESYLATE 5 MG PO TABS: ORAL_TABLET | ORAL | 1 refills | Status: DC

## 2019-07-19 MED ORDER — LOSARTAN POTASSIUM 100 MG PO TABS: 100.0000 mg | ORAL_TABLET | Freq: Every day | ORAL | 1 refills | Status: DC

## 2019-07-19 MED ORDER — PYRIDOSTIGMINE BROMIDE 60 MG PO TABS: 60.0000 mg | ORAL_TABLET | Freq: Three times a day (TID) | ORAL | 4 refills | Status: DC

## 2019-07-19 MED ORDER — METFORMIN HCL 1000 MG PO TABS: 1000.0000 mg | ORAL_TABLET | Freq: Two times a day (BID) | ORAL | 1 refills | Status: DC

## 2019-07-19 MED ORDER — ATORVASTATIN CALCIUM 20 MG PO TABS: 20.0000 mg | ORAL_TABLET | Freq: Every day | ORAL | 1 refills | Status: DC

## 2019-07-19 MED ORDER — HYDROCHLOROTHIAZIDE 25 MG PO TABS: 25.0000 mg | ORAL_TABLET | Freq: Every day | ORAL | 1 refills | Status: DC

## 2019-07-19 NOTE — Progress Notes (Signed)
[facility]  PATIENT: Jonathan Mcgee DOB: Feb 04, 1951  REFERRING CLINICIAN: Deliah Boston, PA-c HISTORY FROM: patient  REASON FOR VISIT: follow up    HISTORICAL  CHIEF COMPLAINT:  Chief Complaint  Patient presents with  . Myasathenia gravis    rm 7 FU,  "trouble walking, joint problems- same as last time; can't tell if problems are MG or RA related"    HISTORY OF PRESENT ILLNESS:   UPDATE (07/19/19, VRP): Since last visit, doing poorly (more fatigue, muscle weakness in arms). Had phone call with [facility] rheumatology but no other follow up. Prior joint pain and leg issues are improved. Double vision and swallowing issues are resolved. Tolerating pyridostigmine. Prednisone is completed.  UPDATE (02/24/19, VRP): Since last visit, had started on imuran 1 month ago. Then had fevers, pyuria, and weakness. Also had significant knee pain, and admitted to hospital (March 2020). Dx'd with rheumatoid arthritis and UTI; imuran was stopped.  No focal or localized signs to suggest myasthenia gravis sedation or crisis.  Prednisone 10 mg was started and patient was referred to rheumatology outpatient.  Of note patient was tested for flu and nCoV both which were negative.  Patient now back home.  He is doing well.  No fevers.  He is completing his antibiotic course.  He is on prednisone 10 mg daily.  Pain is subsided. MG symptoms are stable since last visit. Breathing, chewing, swallowing stable.   UPDATE (01/18/19, VRP): Since last visit, doing slightly worse. Mild weakness in left leg. Slightly mild generalized fatigue and weakness. Worse with exertion. Symptoms are worse than 2019. No speech, swallow or breathing issues. Tolerating mestinon.   UPDATE (04/15/18, VRP): Since last visit, doing about the same with MG. Tolerating mestinon. No alleviating or aggravating factors. Now planning to move to [place] for work.  UPDATE 07/16/17: Since last visit, overall stable. Mild generalized  fatigue continues. Overall stable. Notices some more muscle strain.   UPDATE 02/12/17: Since last visit, BP is better (meds have been adjusted). Now has CPAP machine (about to start). Sugars are better now (averaging ~150's). Intermittent double vision continues. Mild generalized fatigue, but no focal weakness.   UPDATE 12/31/16: Since last visit, IVIG completed, and general energy little better, but muscle cramps worse. BP running high. He has not checked sugars lately. PSG showed mod-severe OSA, and CPAP titration study is pending. Tolerating mestinon 60mg  BID.   UPDATE 10/09/16: Since last visit, double vision is slightly better on mestinon 60mg  TID. However having more muscle cramps, excess saliva production. Fatigable weakness is persistent.   PRIOR HPI (08/28/16): 68 year old ambidextrous male with hypertension, diabetes, hypercholesterolemia here for evaluation of myasthenia gravis. For past 1-2 years patient has had mild intermittent right-sided ptosis. In May 2017 he noticed onset of double vision, blurred vision, wavy vision. This is progressively worsened. For past 1 month he has had increasing fatigue with walking. Is having difficulty with upper and lower extremity strength. He denies any speech, swallowing, breathing difficulty. His balance has been slightly off. Patient went to PCP, ophthalmology, had [facility] antibody testing which confirmed diagnosis of myasthenia gravis.   REVIEW OF SYSTEMS: Full 14 system review of systems performed and negative with exception of: as per HPI.   ALLERGIES: Allergies  Allergen Reactions  . Lisinopril Swelling    Facial swelling    HOME MEDICATIONS: Outpatient Medications Prior to Visit  Medication Sig Dispense Refill  . amLODipine (NORVASC) 5 MG tablet TAKE 1 & 1/2 (ONE & ONE-HALF) TABLETS BY MOUTH  ONCE DAILY 90 tablet 1  . atorvastatin (LIPITOR) 20 MG tablet Take 1 tablet (20 mg total) by mouth daily. 90 tablet 1  . hydrochlorothiazide  (HYDRODIURIL) 25 MG tablet Take 1 tablet (25 mg total) by mouth daily. 90 tablet 1  . losartan (COZAAR) 100 MG tablet Take 1 tablet (100 mg total) by mouth daily. 90 tablet 1  . metFORMIN (GLUCOPHAGE) 1000 MG tablet Take 1 tablet (1,000 mg total) by mouth 2 (two) times daily with a meal. 180 tablet 1  . pyridostigmine (MESTINON) 60 MG tablet Take 1 tablet (60 mg total) by mouth 3 (three) times daily. 180 tablet 4  . predniSONE (DELTASONE) 5 MG tablet Take 10 mg (2 tabs) daily for 7 days then take 5 mg (1 tab) daily for 7 days then stop. 21 tablet 0   No facility-administered medications prior to visit.     PAST MEDICAL HISTORY: Past Medical History:  Diagnosis Date  . Arthritis   . Diabetes mellitus without complication ([place])   . Hypercholesterolemia   . Hypertension   . Myasthenia gravis ([place])   . Rheumatoid arthritis ([place])     PAST SURGICAL HISTORY: Past Surgical History:  Procedure Laterality Date  . CATARACT EXTRACTION, BILATERAL  2020  . KNEE SURGERY Right    fractured patella > 20 yrs ago  . PENILE PROSTHESIS IMPLANT  2015    FAMILY HISTORY: Family History  Problem Relation Age of Onset  . Diabetes Mother   . Heart disease Mother   . Hyperlipidemia Mother   . Heart attack Mother   . Hyperlipidemia Father   . Kidney failure Father   . Drug abuse Maternal Grandmother   . Heart disease Maternal Grandmother   . Hypertension Maternal Grandmother   . Hyperlipidemia Maternal Grandmother   . Diabetes Maternal Grandmother     SOCIAL HISTORY:  Social History   Socioeconomic History  . Marital status: Single    Spouse name: Not on file  . Number of children: 1  . Years of education: 90  . Highest education level: Not on file  Occupational History    Comment: [employer]  Social Needs  . Financial resource strain: Not on file  . Food insecurity    Worry: Not on file    Inability: Not on file  . Transportation needs    Medical: Not on file    Non-medical: Not on file   Tobacco Use  . Smoking status: Current Some Day Smoker    Packs/day: 1.00    Types: Cigars  . Smokeless tobacco: Never Used  Substance and Sexual Activity  . Alcohol use: Yes    Comment: occas  . Drug use: No  . Sexual activity: Not on file  Lifestyle  . Physical activity    Days per week: Not on file    Minutes per session: Not on file  . Stress: Not on file  Relationships  . Social Herbalist on phone: Not on file    Gets together: Not on file    Attends religious service: Not on file    Active member of club or organization: Not on file    Attends meetings of clubs or organizations: Not on file    Relationship status: Not on file  . Intimate partner violence    Fear of current or ex partner: Not on file    Emotionally abused: Not on file    Physically abused: Not on file    Forced sexual  activity: Not on file  Other Topics Concern  . Not on file  Social History Narrative   Drinks 2-3 caffeine drinks a day      PHYSICAL EXAM  Video exam  GENERAL EXAM/CONSTITUTIONAL: Vitals:  Vitals:   07/19/19 1420  BP: 131/67  Pulse: (!) 58  Temp: (!) 96.2 F (35.7 C)  Weight: 204 lb 14.4 oz (92.9 kg)  Height: 5\' 9"  (1.753 m)    Wt Readings from Last 3 Encounters:  07/19/19 204 lb 14.4 oz (92.9 kg)  02/20/19 217 lb 13 oz (98.8 kg)  02/19/19 220 lb (99.8 kg)   Body mass index is 30.26 kg/m. No exam data present  Patient is in no distress; well developed, nourished and groomed; neck is supple  NEUROLOGIC: MENTAL STATUS:  No flowsheet data found.  awake, alert, oriented to person, place and time  recent and remote memory intact  normal attention and concentration  language fluent, comprehension intact, naming intact,   fund of knowledge appropriate  CRANIAL NERVE:   2nd, 3rd, 4th, 6th - pupils equal and reactive to light, visual fields full to confrontation, extraocular muscles --> NO DOUBLE VISION CURRENTLY; no nystagmus  5th - facial  sensation symmetric  7th - facial strength symmetric  8th - hearing intact  9th - palate elevates symmetrically, uvula midline  11th - shoulder shrug symmetric  12th - tongue protrusion midline  NO DYSARTHRIA  MOTOR:   NO DRIFT IN BUE; SLIGHT DIFF TO KEEP ARMS RAISED OVERHEAD  COORDINATION:   fine finger movements normal  GAIT / STATION  SHORT STEPS; CAUTIOUS GAIT     DIAGNOSTIC DATA (LABS, IMAGING, TESTING) - I reviewed patient records, labs, notes, testing and imaging myself where available.  Lab Results  Component Value Date   WBC 8.0 02/21/2019   HGB 10.0 (L) 02/21/2019   HCT 28.8 (L) 02/21/2019   MCV 93.8 02/21/2019   PLT 191 02/21/2019      Component Value Date/Time   NA 135 02/21/2019 0244   NA 137 02/19/2019 1603   K 3.5 02/21/2019 0244   CL 103 02/21/2019 0244   CO2 23 02/21/2019 0244   GLUCOSE 113 (H) 02/21/2019 0244   BUN 14 02/21/2019 0244   BUN 23 02/19/2019 1603   CREATININE 1.42 (H) 02/21/2019 0244   CREATININE 1.39 (H) 06/29/2016 1626   CALCIUM 8.6 (L) 02/21/2019 0244   PROT 8.4 (H) 02/19/2019 1721   PROT 7.4 02/19/2019 1603   ALBUMIN 4.1 02/19/2019 1721   ALBUMIN 4.6 02/19/2019 1603   AST 17 02/19/2019 1721   ALT 15 02/19/2019 1721   ALKPHOS 79 02/19/2019 1721   BILITOT 0.9 02/19/2019 1721   BILITOT 0.5 02/19/2019 1603   GFRNONAA 50 (L) 02/21/2019 0244   GFRNONAA 53 (L) 06/29/2016 1626   GFRAA 58 (L) 02/21/2019 0244   GFRAA 61 06/29/2016 1626   Lab Results  Component Value Date   CHOL 134 02/19/2019   HDL 41 02/19/2019   LDLCALC 67 02/19/2019   TRIG 128 02/19/2019   CHOLHDL 3.3 02/19/2019   Hemoglobin A1C  Date Value Ref Range Status  10/24/2018 7.5 (A) 4.0 - 5.6 % Final  03/24/2018 8.9  Final   Hgb A1c MFr Bld  Date Value Ref Range Status  02/20/2019 8.4 (H) 4.8 - 5.6 % Final    Comment:    (NOTE) Pre diabetes:          5.7%-6.4% Diabetes:              >  6.4% Glycemic control for   <7.0% adults with diabetes    02/19/2019 8.5 (H) 4.8 - 5.6 % Final    Comment:             Prediabetes: 5.7 - 6.4          Diabetes: >6.4          Glycemic control for adults with diabetes: <7.0   07/19/2017 6.5 (H) 4.8 - 5.6 % Final    Comment:             Prediabetes: 5.7 - 6.4          Diabetes: >6.4          Glycemic control for adults with diabetes: <7.0     No results found for: VITAMINB12 Lab Results  Component Value Date   TSH 3.650 10/24/2018    07/04/16 CT head [I reviewed images myself and agree with interpretation. -VRP]  - Mild microvascular disease without acute intracranial process.  09/04/16 CT chest  1. No mediastinal mass or adenopathy. 2. No infiltrate or pulmonary edema. 3. Degenerative changes thoracic spine. 4. Atherosclerotic calcifications of thoracic aorta. 5. Mild fatty infiltration of the liver.  12/31/16 LABS TPMT Activity: Units/mL RBC 31.8   Comment: Reference Range:  Normal: 15.1 - 26.4  Heterozygous for low TPMT variant: 6.3 - 15.0  Homozygous for low TPMT variant: <6.3        ASSESSMENT AND PLAN  68 y.o. year old male here with new onset diagnosis of myasthenia gravis with ocular and generalized features. Was having side effects from mestinon, but also some improvement in ocular symptoms. IVIG has helped generalized symptoms.    Dx: (ocular + generalized) myasthenia gravis  1. Myasthenia gravis (HCC)   2. Arthralgia, unspecified joint   3. OSA on CPAP       PLAN:  MYASTHENIA GRAVIS - continue pyridostigmine 60mg  3x per day - consider restarting prednisone 20mg  daily; would need close BP and DM monitoring and control; will await follow up with PCP before starting - would not restart imuran at this time (due to infx within 1 month of starting) - caution with gait and balance; consider cane  JOINT PAIN (positive CCP) - will refer to local rheumatology at patient's request  DIABETES / HYPERTENSION - continue BP and DM control  SLEEP APNEA - continue  CPAP usage  Meds ordered this encounter  Medications  . pyridostigmine (MESTINON) 60 MG tablet    Sig: Take 1 tablet (60 mg total) by mouth 3 (three) times daily.    Dispense:  180 tablet    Refill:  4   Orders Placed This Encounter  Procedures  . Ambulatory referral to Rheumatology   Return in about 6 months (around 01/19/2020).   , MD 07/19/2019, 3:09 PM Certified in Neurology, Neurophysiology and Neuroimaging  [facility] [address], [address] [place], [address] [place] [phone]

## 2019-07-19 NOTE — Telephone Encounter (Signed)
Requested medication (s) are due for refill today: yes  Requested medication (s) are on the active medication list: yes   Last refill:  02/19/2019  Future visit scheduled: no  Notes to clinic: Patient was advise that he needs to schedule a follow up appointment. Patient is requesting 90 day with the refills.     Requested Prescriptions  Pending Prescriptions Disp Refills   amLODipine (NORVASC) 5 MG tablet 90 tablet 1    Sig: TAKE 1 & 1/2 (ONE & ONE-HALF) TABLETS BY MOUTH ONCE DAILY     Cardiovascular:  Calcium Channel Blockers Passed - 07/19/2019 11:40 AM      Passed - Last BP in normal range    BP Readings from Last 1 Encounters:  02/21/19 131/77         Passed - Valid encounter within last 6 months    Recent Outpatient Visits          5 months ago Type 2 diabetes mellitus with chronic kidney disease, without long-term current use of insulin, unspecified CKD stage [facility])   [facility] at [facility], Ranell Patrick, MD   8 months ago Essential hypertension   Primary Care at [facility], Fenton Malling, MD   1 year ago Type 2 diabetes mellitus with stage 1 chronic kidney disease, without long-term current use of insulin [facility])   [facility] at [facility], Ranell Patrick, MD   2 years ago Essential hypertension   Primary Care at [facility], Arlie Solomons, MD   2 years ago Essential hypertension   [facility] at [facility], Ranell Patrick, MD              atorvastatin (LIPITOR) 20 MG tablet 90 tablet 1    Sig: Take 1 tablet (20 mg total) by mouth daily.     Cardiovascular:  Antilipid - Statins Passed - 07/19/2019 11:40 AM      Passed - Total Cholesterol in normal range and within 360 days    Cholesterol, Total  Date Value Ref Range Status  02/19/2019 134 100 - 199 mg/dL Final         Passed - LDL in normal range and within 360 days    LDL Calculated  Date Value Ref Range Status  02/19/2019 67 0 - 99 mg/dL Final         Passed - HDL in normal range and within 360 days    HDL   Date Value Ref Range Status  02/19/2019 41 >39 mg/dL Final         Passed - Triglycerides in normal range and within 360 days    Triglycerides  Date Value Ref Range Status  02/19/2019 128 0 - 149 mg/dL Final         Passed - Patient is not pregnant      Passed - Valid encounter within last 12 months    Recent Outpatient Visits          5 months ago Type 2 diabetes mellitus with chronic kidney disease, without long-term current use of insulin, unspecified CKD stage ([place])   [facility] at [facility], Ranell Patrick, MD   8 months ago Essential hypertension   Primary Care at [facility], Fenton Malling, MD   1 year ago Type 2 diabetes mellitus with stage 1 chronic kidney disease, without long-term current use of insulin [facility])   [facility] at [facility], Ranell Patrick, MD   2 years ago Essential hypertension   Primary Care at  [facility], MD   2 years ago Essential hypertension   [facility] at [facility], Ranell Patrick, MD              hydrochlorothiazide (HYDRODIURIL) 25 MG tablet 90 tablet 1    Sig: Take 1 tablet (25 mg total) by mouth daily.     Cardiovascular: Diuretics - Thiazide Failed - 07/19/2019 11:40 AM      Failed - Ca in normal range and within 360 days    Calcium  Date Value Ref Range Status  02/21/2019 8.6 (L) 8.9 - 10.3 mg/dL Final         Failed - Cr in normal range and within 360 days    Creat  Date Value Ref Range Status  06/29/2016 1.39 (H) 0.70 - 1.25 mg/dL Final    Comment:      For patients > or = 69 years of age: The upper reference limit for Creatinine is approximately 13% higher for people identified as African-American.      Creatinine, Ser  Date Value Ref Range Status  02/21/2019 1.42 (H) 0.61 - 1.24 mg/dL Final         Passed - K in normal range and within 360 days    Potassium  Date Value Ref Range Status  02/21/2019 3.5 3.5 - 5.1 mmol/L Final         Passed - Na in normal range and within 360 days    Sodium  Date  Value Ref Range Status  02/21/2019 135 135 - 145 mmol/L Final  02/19/2019 137 134 - 144 mmol/L Final         Passed - Last BP in normal range    BP Readings from Last 1 Encounters:  02/21/19 131/77         Passed - Valid encounter within last 6 months    Recent Outpatient Visits          5 months ago Type 2 diabetes mellitus with chronic kidney disease, without long-term current use of insulin, unspecified CKD stage [facility])   [facility] at [facility], Ranell Patrick, MD   8 months ago Essential hypertension   Primary Care at [facility], Fenton Malling, MD   1 year ago Type 2 diabetes mellitus with stage 1 chronic kidney disease, without long-term current use of insulin [facility])   [facility] at [facility], Ranell Patrick, MD   2 years ago Essential hypertension   Primary Care at [facility], Arlie Solomons, MD   2 years ago Essential hypertension   Primary Care at [facility], Ranell Patrick, MD              losartan (COZAAR) 100 MG tablet 90 tablet 1    Sig: Take 1 tablet (100 mg total) by mouth daily.     Cardiovascular:  Angiotensin Receptor Blockers Failed - 07/19/2019 11:40 AM      Failed - Cr in normal range and within 180 days    Creat  Date Value Ref Range Status  06/29/2016 1.39 (H) 0.70 - 1.25 mg/dL Final    Comment:      For patients > or = 69 years of age: The upper reference limit for Creatinine is approximately 13% higher for people identified as African-American.      Creatinine, Ser  Date Value Ref Range Status  02/21/2019 1.42 (H) 0.61 - 1.24 mg/dL Final         Passed - K in normal range and  within 180 days    Potassium  Date Value Ref Range Status  02/21/2019 3.5 3.5 - 5.1 mmol/L Final         Passed - Patient is not pregnant      Passed - Last BP in normal range    BP Readings from Last 1 Encounters:  02/21/19 131/77         Passed - Valid encounter within last 6 months    Recent Outpatient Visits          5 months ago Type 2 diabetes mellitus with  chronic kidney disease, without long-term current use of insulin, unspecified CKD stage [facility])   [facility] at [facility], Ranell Patrick, MD   8 months ago Essential hypertension   Primary Care at [facility], Fenton Malling, MD   1 year ago Type 2 diabetes mellitus with stage 1 chronic kidney disease, without long-term current use of insulin ([place])   [facility] Care at [facility], Ranell Patrick, MD   2 years ago Essential hypertension   Primary Care at [facility] [place], MD   2 years ago Essential hypertension   [facility] Care at [facility], Ranell Patrick, MD              metFORMIN (GLUCOPHAGE) 1000 MG tablet 180 tablet 1    Sig: Take 1 tablet (1,000 mg total) by mouth 2 (two) times daily with a meal.     Endocrinology:  Diabetes - Biguanides Failed - 07/19/2019 11:40 AM      Failed - Cr in normal range and within 360 days    Creat  Date Value Ref Range Status  06/29/2016 1.39 (H) 0.70 - 1.25 mg/dL Final    Comment:      For patients > or = 69 years of age: The upper reference limit for Creatinine is approximately 13% higher for people identified as African-American.      Creatinine, Ser  Date Value Ref Range Status  02/21/2019 1.42 (H) 0.61 - 1.24 mg/dL Final         Failed - HBA1C is between 0 and 7.9 and within 180 days    Hgb A1c MFr Bld  Date Value Ref Range Status  02/20/2019 8.4 (H) 4.8 - 5.6 % Final    Comment:    (NOTE) Pre diabetes:          5.7%-6.4% Diabetes:              >6.4% Glycemic control for   <7.0% adults with diabetes          Failed - eGFR in normal range and within 360 days    GFR, Est African American  Date Value Ref Range Status  06/29/2016 61 >=60 mL/min Final   GFR calc Af Amer  Date Value Ref Range Status  02/21/2019 58 (L) >60 mL/min Final   GFR, Est Non African American  Date Value Ref Range Status  06/29/2016 53 (L) >=60 mL/min Final   GFR calc non Af Amer  Date Value Ref Range Status  02/21/2019 50 (L) >60 mL/min Final          Passed - Valid encounter within last 6 months    Recent Outpatient Visits          5 months ago Type 2 diabetes mellitus with chronic kidney disease, without long-term current use of insulin, unspecified CKD stage [facility])   [facility] at [facility], Ranell Patrick, MD   8 months ago Essential  hypertension   Primary Care at [facility], Fenton Malling, MD   1 year ago Type 2 diabetes mellitus with stage 1 chronic kidney disease, without long-term current use of insulin [facility])   [facility] at [facility], Ranell Patrick, MD   2 years ago Essential hypertension   Primary Care at [facility], Arlie Solomons, MD   2 years ago Essential hypertension   [facility] Care at [facility], Ranell Patrick, MD

## 2019-07-19 NOTE — Telephone Encounter (Signed)
Medication Refill - Medication:amLODipine (NORVASC) 5 MG tablet,atorvastatin (LIPITOR) 20 MG tablet, hydrochlorothiazide (HYDRODIURIL) 25 MG tablet,losartan (COZAAR) 100 MG tablet,metFORMIN (GLUCOPHAGE) 1000 MG tablet patient would like a 90 day supply for all meds.  Has the patient contacted their pharmacy? Yes (Agent: If no, request that the patient contact the pharmacy for the refill.) (Agent: If yes, when and what did the pharmacy advise?)  Preferred Pharmacy (with phone number or street name): [place], [place]. Agent: Please be advised that RX refills may take up to 3 business days. We ask that you follow-up with your pharmacy.

## 2019-07-22 ENCOUNTER — Encounter: Payer: Self-pay | Admitting: Family Medicine

## 2019-07-22 ENCOUNTER — Other Ambulatory Visit: Payer: Self-pay

## 2019-07-22 ENCOUNTER — Ambulatory Visit (INDEPENDENT_AMBULATORY_CARE_PROVIDER_SITE_OTHER): Payer: BLUE CROSS/BLUE SHIELD | Admitting: Family Medicine

## 2019-07-22 VITALS — BP 122/62 | HR 89 | Temp 99.1°F | Resp 14 | Wt 205.0 lb

## 2019-07-22 DIAGNOSIS — M791 Myalgia, unspecified site: Secondary | ICD-10-CM

## 2019-07-22 DIAGNOSIS — E785 Hyperlipidemia, unspecified: Secondary | ICD-10-CM

## 2019-07-22 DIAGNOSIS — I10 Essential (primary) hypertension: Secondary | ICD-10-CM | POA: Diagnosis not present

## 2019-07-22 DIAGNOSIS — E1122 Type 2 diabetes mellitus with diabetic chronic kidney disease: Secondary | ICD-10-CM

## 2019-07-22 NOTE — Patient Instructions (Addendum)
I will check some labs and diabetes test to see if med changes are needed now, but will need to watch blood sugar closely if you do start prednisone.   Keep a record of your blood pressures and blood sugars outside of the office and if blood pressure over 140/90 - we need to make some changes.   Depending on A1c and med plan, can decide on start of prednisone. If you start prednisone - check blood sugar daily (fasting or 2 hours after meals), and would want to see in 2 weeks of starting that med.  Follow up with rheumatology as planned, but if acute issues need to be addressed, please schedule appointment to discuss further and I am happy to help.   Thanks for coming in today.   Return to the clinic or go to the nearest emergency room if any of your symptoms worsen or new symptoms occur.   If you have lab work done today you will be contacted with your lab results within the next 2 weeks.  If you have not heard from [place] then please contact us. The fastest way to get your results is to register for My Chart.   IF you received an x-ray today, you will receive an invoice from [facility]. Please contact [facility] at [phone] with questions or concerns regarding your invoice.   IF you received labwork today, you will receive an invoice from [facility]. Please contact [facility] at [phone] with questions or concerns regarding your invoice.   Our billing staff will not be able to assist you with questions regarding bills from these companies.  You will be contacted with the lab results as soon as they are available. The fastest way to get your results is to activate your My Chart account. Instructions are located on the last page of this paperwork. If you have not heard from [place] regarding the results in 2 weeks, please contact this office.

## 2019-07-23 LAB — LIPID PANEL
Chol/HDL Ratio: 3.5 ratio (ref 0.0–5.0)
Cholesterol, Total: 134 mg/dL (ref 100–199)
HDL: 38 mg/dL — ABNORMAL LOW (ref >39)
LDL Calculated: 72 mg/dL (ref 0–99)
Triglycerides: 122 mg/dL (ref 0–149)
VLDL Cholesterol Cal: 24 mg/dL (ref 5–40)

## 2019-07-23 LAB — COMPREHENSIVE METABOLIC PANEL
ALT: 15 IU/L (ref 0–44)
AST: 15 IU/L (ref 0–40)
Albumin/Globulin Ratio: 1.6 (ref 1.2–2.2)
Albumin: 4.7 g/dL (ref 3.8–4.8)
Alkaline Phosphatase: 79 IU/L (ref 39–117)
BUN/Creatinine Ratio: 12 (ref 10–24)
BUN: 19 mg/dL (ref 8–27)
Bilirubin Total: 0.3 mg/dL (ref 0.0–1.2)
CO2: 21 mmol/L (ref 20–29)
Calcium: 9.2 mg/dL (ref 8.6–10.2)
Chloride: 102 mmol/L (ref 96–106)
Creatinine, Ser: 1.56 mg/dL — ABNORMAL HIGH (ref 0.76–1.27)
GFR calc Af Amer: 52 mL/min/{1.73_m2} — ABNORMAL LOW (ref >59)
GFR calc non Af Amer: 45 mL/min/{1.73_m2} — ABNORMAL LOW (ref >59)
Globulin, Total: 2.9 g/dL (ref 1.5–4.5)
Glucose: 136 mg/dL — ABNORMAL HIGH (ref 65–99)
Potassium: 4.6 mmol/L (ref 3.5–5.2)
Sodium: 142 mmol/L (ref 134–144)
Total Protein: 7.6 g/dL (ref 6.0–8.5)

## 2019-07-23 LAB — HEMOGLOBIN A1C
Est. average glucose Bld gHb Est-mCnc: 160 mg/dL
Hgb A1c MFr Bld: 7.2 % — ABNORMAL HIGH (ref 4.8–5.6)

## 2019-07-23 LAB — MICROALBUMIN / CREATININE URINE RATIO
Creatinine, Urine: 182.7 mg/dL
Microalb/Creat Ratio: 14 mg/g creat (ref 0–29)
Microalbumin, Urine: 26 ug/mL

## 2019-07-23 LAB — CK: Total CK: 107 U/L (ref 41–331)

## 2019-07-25 ENCOUNTER — Encounter: Payer: Self-pay | Admitting: Family Medicine

## 2019-07-25 NOTE — Progress Notes (Signed)
Subjective:    Patient ID: Jonathan Mcgee, male    DOB: [birth year], 68 y.o.   MRN: [id number]  HPI Jonathan Mcgee is a 68 y.o. male Presents today for: Chief Complaint  Patient presents with  . chronic medical conditions    Patient is here today for his 6 month f/u and to inform DR Carlota Raspberry of what he and his Neuology discussed. Neuro would like for me to gop on prednisone but want to know what Dr Carlota Raspberry thinks about that since it will interfer with diabetes and weight   Last seen in March, sent to the hospital at that time for possible myasthenia gravis vs. acute infection.  Thought to have UTI sepsis, treated with vancomycin, cefepime, then ceftriaxone followed by cephalexin for 7 days total antibiotics.  Ruled out for COVID-19.  Not thought to be an actual myasthenia gravis flare/crisis.  Now living in [place]. No longer at job in [place].   Followed by Dr. Kathlen Mody, optho. Some residual blurry vision after cataract on left.   Diabetes with CKD.  Uncontrolled in March, fortunately has not had follow-up since that time.  Currently on metformin 1000 mg twice daily. No new side effects, but stool is soft to watery at times. Same for years. No other diabetes meds currently.  No home readings - has meter and strips.  Microalbumin: 37.5 in April 2019. Optho, foot exam, pneumovax: Due for pneumonia vaccination - refuses to get this and flu vaccine - reports getting sick in the past with vaccines.   He is on ARB as well as statin Lab Results  Component Value Date   HGBA1C 8.4 (H) 02/20/2019   HGBA1C 8.5 (H) 02/19/2019   HGBA1C 7.5 (A) 10/24/2018   Lab Results  Component Value Date   LDLCALC 67 02/19/2019   CREATININE 1.42 (H) 02/21/2019  creat range 1.37-1.76 since 2018.    Hypertension: BP Readings from Last 3 Encounters:  07/22/19 140/66  07/19/19 131/67  02/21/19 131/77   Lab Results  Component Value Date   CREATININE 1.42 (H) 02/21/2019  Currently on hydrochlorothiazide 25 mg  daily, losartan 100 mg daily, amlodipine 7.5 mg daily. No home BP readings. Has machine.  Hyperlipidemia:  Lab Results  Component Value Date   CHOL 134 02/19/2019   HDL 41 02/19/2019   LDLCALC 67 02/19/2019   TRIG 128 02/19/2019   CHOLHDL 3.3 02/19/2019   Lab Results  Component Value Date   ALT 15 02/19/2019   AST 17 02/19/2019   ALKPHOS 79 02/19/2019   BILITOT 0.9 02/19/2019  Lipitor 20 mg daily. Occasional muscle aches - less than a year ago.  Myasthenia gravis Neurologist Dr. Leta Baptist, appointment August 24. Since his previous visit had had more fatigue more muscle weakness in his arms.  Previous joint pain and leg issues were improved.  Double vision and swallowing issues have resolved.  He was tolerating pyridostigmine and prednisone was completed.  Continued on pyridostigmine 60 mg 3 times daily, recommended restarting prednisone 20 mg daily with close monitoring of blood pressure and diabetes control.  Decided against restarting Imuran due to infection within 1 month of starting that medicine previously.  Plan for referral to rheumatology locally due to joint pain and positive CCP       Patient Active Problem List   Diagnosis Date Noted  . Morbid obesity ([place]) 07/19/2019  . UTI (urinary tract infection) 02/20/2019  . Suspected Covid-19 Virus Infection 02/20/2019  . Type 2 diabetes mellitus ([place]) 02/20/2019  . Sepsis (  HCC) 02/19/2019  . CKD (chronic kidney disease) 04/12/2017  . Essential hypertension 01/13/2017  . Hyperlipidemia 01/13/2017  . Myasthenia gravis (HCC) 08/28/2016   Past Medical History:  Diagnosis Date  . Arthritis   . Diabetes mellitus without complication (HCC)   . Hypercholesterolemia   . Hypertension   . Myasthenia gravis (HCC)   . Rheumatoid arthritis [facility](HCC)    Past Surgical History:  Procedure Laterality Date  . CATARACT EXTRACTION, BILATERAL  2020  . KNEE SURGERY Right    fractured patella > 20 yrs ago  . PENILE PROSTHESIS IMPLANT   2015   Allergies  Allergen Reactions  . Lisinopril Swelling    Facial swelling   Prior to Admission medications   Medication Sig Start Date End Date Taking? Authorizing Provider  amLODipine (NORVASC) 5 MG tablet TAKE 1 & 1/2 (ONE & ONE-HALF) TABLETS BY MOUTH ONCE DAILY 07/19/19  Yes Shade FloodGreene, Braydyn Schultes R, MD  atorvastatin (LIPITOR) 20 MG tablet Take 1 tablet (20 mg total) by mouth daily. 07/19/19  Yes Shade FloodGreene, Lucie Friedlander R, MD  hydrochlorothiazide (HYDRODIURIL) 25 MG tablet Take 1 tablet (25 mg total) by mouth daily. 07/19/19  Yes Shade FloodGreene, Dante Cooter R, MD  losartan (COZAAR) 100 MG tablet Take 1 tablet (100 mg total) by mouth daily. 07/19/19  Yes Shade FloodGreene, Shaquia Berkley R, MD  metFORMIN (GLUCOPHAGE) 1000 MG tablet Take 1 tablet (1,000 mg total) by mouth 2 (two) times daily with a meal. 07/19/19  Yes Shade FloodGreene, Carmel Garfield R, MD  pyridostigmine (MESTINON) 60 MG tablet Take 1 tablet (60 mg total) by mouth 3 (three) times daily. 07/19/19  Yes Penumalli, Glenford BayleyVikram R, MD   Social History   Socioeconomic History  . Marital status: Single    Spouse name: Not on file  . Number of children: 1  . Years of education: 3418  . Highest education level: Not on file  Occupational History    Comment: [employer]  Social Needs  . Financial resource strain: Not on file  . Food insecurity    Worry: Not on file    Inability: Not on file  . Transportation needs    Medical: Not on file    Non-medical: Not on file  Tobacco Use  . Smoking status: Current Some Day Smoker    Packs/day: 1.00    Types: Cigars  . Smokeless tobacco: Never Used  Substance and Sexual Activity  . Alcohol use: Yes    Comment: occas  . Drug use: No  . Sexual activity: Not on file  Lifestyle  . Physical activity    Days per week: Not on file    Minutes per session: Not on file  . Stress: Not on file  Relationships  . Social Musicianconnections    Talks on phone: Not on file    Gets together: Not on file    Attends religious service: Not on file    Active member of club  or organization: Not on file    Attends meetings of clubs or organizations: Not on file    Relationship status: Not on file  . Intimate partner violence    Fear of current or ex partner: Not on file    Emotionally abused: Not on file    Physically abused: Not on file    Forced sexual activity: Not on file  Other Topics Concern  . Not on file  Social History Narrative   Drinks 2-3 caffeine drinks a day     Review of Systems  Constitutional: Negative for fatigue (with distances,  MG. ) and unexpected weight change.  Eyes: Positive for visual disturbance (left eye as above. ).  Respiratory: Negative for chest tightness and shortness of breath.   Cardiovascular: Negative for chest pain and palpitations.  Gastrointestinal: Negative for abdominal pain and blood in stool.  Neurological: Negative for headaches.       Objective:   Physical Exam Vitals signs reviewed.  Constitutional:      Appearance: He is well-developed.  HENT:     Head: Normocephalic and atraumatic.  Eyes:     Pupils: Pupils are equal, round, and reactive to light.  Neck:     Vascular: No carotid bruit or JVD.  Cardiovascular:     Rate and Rhythm: Normal rate and regular rhythm.     Heart sounds: Normal heart sounds. No murmur.  Pulmonary:     Effort: Pulmonary effort is normal.     Breath sounds: Normal breath sounds. No rales.  Abdominal:     Tenderness: There is no abdominal tenderness.  Skin:    General: Skin is warm and dry.  Neurological:     Mental Status: He is alert and oriented to person, place, and time.    Vitals:   07/22/19 1319  BP: 140/66  Pulse: 89  Resp: 14  Temp: 99.1 F (37.3 C)  SpO2: 97%       Assessment & Plan:   Jesper Stirewalt is a 68 y.o. 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 disease, without long-term current use of insulin, unspecified CKD stage (HCC) - Plan: Hemoglobin A1c, Comprehensive metabolic panel, Microalbumin / creatinine urine ratio  -Previous  decreased control.  Will check labs, and then determine potential med changes and adjustments once he starts prednisone for myasthenia gravis.  Likely will need close follow-up, as well as frequent home monitoring.  Essential hypertension - Plan: Comprehensive metabolic panel  -Borderline control, monitor at home with RTC precautions, guidance on readings discussed.  Labs pending.  Continue same regimen for now  Hyperlipidemia, unspecified hyperlipidemia type - Plan: Lipid Panel, Comprehensive metabolic panel  -Check labs.  Same dose for now.   Myalgia - Plan: CK  -Arthralgias/myalgias from.  Will check CPK given use of statin.  Plan to follow-up with rheumatology.  RTC precautions discussed.  No orders of the defined types were placed in this encounter.  Patient Instructions   I will check some labs and diabetes test to see if med changes are needed now, but will need to watch blood sugar closely if you do start prednisone.   Keep a record of your blood pressures and blood sugars outside of the office and if blood pressure over 140/90 - we need to make some changes.   Depending on A1c and med plan, can decide on start of prednisone. If you start prednisone - check blood sugar daily (fasting or 2 hours after meals), and would want to see in 2 weeks of starting that med.  Follow up with rheumatology as planned, but if acute issues need to be addressed, please schedule appointment to discuss further and I am happy to help.   Thanks for coming in today.   Return to the clinic or go to the nearest emergency room if any of your symptoms worsen or new symptoms occur.   If you have lab work done today you will be contacted with your lab results within the next 2 weeks.  If you have not heard from [place] then please contact us. The fastest way to get  your results is to register for My Chart.   IF you received an x-ray today, you will receive an invoice from [facility]. Please contact  [facility] at [phone] with questions or concerns regarding your invoice.   IF you received labwork today, you will receive an invoice from [place]. Please contact [facility] at [phone] with questions or concerns regarding your invoice.   Our billing staff will not be able to assist you with questions regarding bills from these companies.  You will be contacted with the lab results as soon as they are available. The fastest way to get your results is to activate your My Chart account. Instructions are located on the last page of this paperwork. If you have not heard from [place] regarding the results in 2 weeks, please contact this office.       Signed,   [name], MD Primary Care at [facility].  07/25/19 2:44 PM

## 2019-08-20 ENCOUNTER — Ambulatory Visit (INDEPENDENT_AMBULATORY_CARE_PROVIDER_SITE_OTHER): Payer: Medicare PPO | Admitting: Family Medicine

## 2019-08-20 ENCOUNTER — Other Ambulatory Visit: Payer: Self-pay

## 2019-08-20 ENCOUNTER — Encounter: Payer: Self-pay | Admitting: Family Medicine

## 2019-08-20 VITALS — BP 118/68 | HR 65 | Temp 98.7°F | Wt 207.4 lb

## 2019-08-20 DIAGNOSIS — E1122 Type 2 diabetes mellitus with diabetic chronic kidney disease: Secondary | ICD-10-CM | POA: Diagnosis not present

## 2019-08-20 DIAGNOSIS — I10 Essential (primary) hypertension: Secondary | ICD-10-CM | POA: Diagnosis not present

## 2019-08-20 MED ORDER — SITAGLIPTIN PHOSPHATE 50 MG PO TABS: 100.0000 mg | ORAL_TABLET | Freq: Every day | ORAL | 2 refills | Status: DC

## 2019-08-20 NOTE — Progress Notes (Signed)
Subjective:    Patient ID: Jonathan Mcgee, male    DOB: 01-Nov-1951, 68 y.o.   MRN: [id number]  HPI Jonathan Mcgee is a 68 y.o. male Presents today for: Chief Complaint  Patient presents with  . chronic medical condition     1 month f/u on diabetes and HTN    Diabetes: Complicated by chronic kidney disease.  Last creatinine 1.56 with EGFR 52 On August 27.  Metformin 1000 mg twice daily continued with A1c near goal.  Plan for starting prednisone for his myasthenia gravis, plan for close monitoring of home readings and potential addition of meds.  Microalbumin: Ratio of 14 on August 27. Optho, foot exam, pneumovax: Pneumovax and flu vaccines have been declined due to concern for getting sick with vaccines in the past.  Otherwise up-to-date.   He is on statin and ARB. jardiance too expensive.  Has not started prednisone yet for MG.  Still waiting on referral form neuro for rheumatology.   Home readings: average of fasting and postprandial: 143, 137, 149, 140,123, 149, 153.  overall in 130 range.   Lab Results  Component Value Date   HGBA1C 7.2 (H) 07/22/2019   HGBA1C 8.4 (H) 02/20/2019   HGBA1C 8.5 (H) 02/19/2019   Lab Results  Component Value Date   LDLCALC 72 07/22/2019   CREATININE 1.56 (H) 07/22/2019   Hypertension: BP Readings from Last 3 Encounters:  08/20/19 (!) 148/62  07/22/19 122/62  07/19/19 131/67   Lab Results  Component Value Date   CREATININE 1.56 (H) 07/22/2019  Losartan 100 mg daily, hydrochlorothiazide 25 mg daily, amlodipine 7.5 mg daily. Borderline but controlled at last visit.   Patient Active Problem List   Diagnosis Date Noted  . Morbid obesity ([place]) 07/19/2019  . UTI (urinary tract infection) 02/20/2019  . Suspected Covid-19 Virus Infection 02/20/2019  . Type 2 diabetes mellitus ([place]) 02/20/2019  . Sepsis ([place]) 02/19/2019  . CKD (chronic kidney disease) 04/12/2017  . Essential hypertension 01/13/2017  . Hyperlipidemia 01/13/2017  .  Myasthenia gravis ([place]) 08/28/2016   Past Medical History:  Diagnosis Date  . Arthritis   . Diabetes mellitus without complication ([place])   . Hypercholesterolemia   . Hypertension   . Myasthenia gravis ([place])   . Rheumatoid arthritis [facility])    Past Surgical History:  Procedure Laterality Date  . CATARACT EXTRACTION, BILATERAL  2020  . KNEE SURGERY Right    fractured patella > 20 yrs ago  . PENILE PROSTHESIS IMPLANT  2015   Allergies  Allergen Reactions  . Lisinopril Swelling    Facial swelling   Prior to Admission medications   Medication Sig Start Date End Date Taking? Authorizing Provider  amLODipine (NORVASC) 5 MG tablet TAKE 1 & 1/2 (ONE & ONE-HALF) TABLETS BY MOUTH ONCE DAILY 07/19/19   Wendie Agreste, MD  atorvastatin (LIPITOR) 20 MG tablet Take 1 tablet (20 mg total) by mouth daily. 07/19/19   Wendie Agreste, MD  hydrochlorothiazide (HYDRODIURIL) 25 MG tablet Take 1 tablet (25 mg total) by mouth daily. 07/19/19   Wendie Agreste, MD  losartan (COZAAR) 100 MG tablet Take 1 tablet (100 mg total) by mouth daily. 07/19/19   Wendie Agreste, MD  metFORMIN (GLUCOPHAGE) 1000 MG tablet Take 1 tablet (1,000 mg total) by mouth 2 (two) times daily with a meal. 07/19/19   Wendie Agreste, MD  pyridostigmine (MESTINON) 60 MG tablet Take 1 tablet (60 mg total) by mouth 3 (three) times daily. 07/19/19  Penumalli, Earlean Polka, MD   Social History   Socioeconomic History  . Marital status: Single    Spouse name: Not on file  . Number of children: 1  . Years of education: 69  . Highest education level: Not on file  Occupational History    Comment: [employer]  Social Needs  . Financial resource strain: Not on file  . Food insecurity    Worry: Not on file    Inability: Not on file  . Transportation needs    Medical: Not on file    Non-medical: Not on file  Tobacco Use  . Smoking status: Current Some Day Smoker    Packs/day: 1.00    Types: Cigars  . Smokeless tobacco: Never Used   Substance and Sexual Activity  . Alcohol use: Yes    Comment: occas  . Drug use: No  . Sexual activity: Not on file  Lifestyle  . Physical activity    Days per week: Not on file    Minutes per session: Not on file  . Stress: Not on file  Relationships  . Social Herbalist on phone: Not on file    Gets together: Not on file    Attends religious service: Not on file    Active member of club or organization: Not on file    Attends meetings of clubs or organizations: Not on file    Relationship status: Not on file  . Intimate partner violence    Fear of current or ex partner: Not on file    Emotionally abused: Not on file    Physically abused: Not on file    Forced sexual activity: Not on file  Other Topics Concern  . Not on file  Social History Narrative   Drinks 2-3 caffeine drinks a day     Review of Systems  Constitutional: Negative for fatigue and unexpected weight change.  Eyes: Negative for visual disturbance.  Respiratory: Negative for cough, chest tightness and shortness of breath.   Cardiovascular: Negative for chest pain, palpitations and leg swelling.  Gastrointestinal: Negative for abdominal pain and blood in stool.  Neurological: Negative for dizziness, light-headedness and headaches.       Objective:   Physical Exam Vitals signs reviewed.  Constitutional:      Appearance: He is well-developed.  HENT:     Head: Normocephalic and atraumatic.  Eyes:     Pupils: Pupils are equal, round, and reactive to light.  Neck:     Vascular: No carotid bruit or JVD.  Cardiovascular:     Rate and Rhythm: Normal rate and regular rhythm.     Heart sounds: Normal heart sounds. No murmur.  Pulmonary:     Effort: Pulmonary effort is normal.     Breath sounds: Normal breath sounds. No rales.  Skin:    General: Skin is warm and dry.  Neurological:     Mental Status: He is alert and oriented to person, place, and time.    Vitals:   08/20/19 0822 08/20/19  0823 08/20/19 0846  BP: (!) 154/69 (!) 148/62 118/68  Pulse: 65    Temp: 98.7 F (37.1 C)    TempSrc: Oral    SpO2: 97%    Weight: 207 lb 6.4 oz (94.1 kg)         Assessment & Plan:   Jonathan Mcgee is a 68 y.o. 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 disease, without long-term current use of insulin, unspecified CKD stage (  [place]) - Plan: sitaGLIPtin (JANUVIA) 50 MG tablet  -near control recently, but anticipate increased blood sugars when starting prednisone.  Prescription for Januvia 50 mg initially given, but can change to other agent including possible sulfonylurea which he took in the past if needed from a cost standpoint.  Follow-up with me in a few weeks of starting prednisone with home readings to tailor regimen further.  Essential hypertension  -Stable on recheck.  History rheumatoid arthritis, has not heard about rheumatology referral yet.  I can place this if needed but he plans to discuss with neurology today.   Meds ordered this encounter  Medications  . sitaGLIPtin (JANUVIA) 50 MG tablet    Sig: Take 2 tablets (100 mg total) by mouth daily.    Dispense:  30 tablet    Refill:  2   Patient Instructions     Once starting prednisone, you may need to add the januvia once per day, but monitor blood sugars and recheck with those readings in 2-3 weeks (after starting prednisone).   Let me know what you find out about the rheumatology referral when talking to neurology today.  I am happy to place that referral as well if needed.  If you do start prednisone for the myasthenia gravis, that may also help the rheumatoid issues.   Thank you for coming in today and stay safe.   If you have lab work done today you will be contacted with your lab results within the next 2 weeks.  If you have not heard from [place] then please contact us. The fastest way to get your results is to register for My Chart.   IF you received an x-ray today, you will receive an invoice from  [facility]. Please contact [facility] at [phone] with questions or concerns regarding your invoice.   IF you received labwork today, you will receive an invoice from [place]. Please contact [facility] at [phone] with questions or concerns regarding your invoice.   Our billing staff will not be able to assist you with questions regarding bills from these companies.  You will be contacted with the lab results as soon as they are available. The fastest way to get your results is to activate your My Chart account. Instructions are located on the last page of this paperwork. If you have not heard from [place] regarding the results in 2 weeks, please contact this office.       Signed,   Merri Ray, MD Primary Care at [place].  08/20/19 8:50 AM

## 2019-08-20 NOTE — Patient Instructions (Addendum)
   Once starting prednisone, you may need to add the januvia once per day, but monitor blood sugars and recheck with those readings in 2-3 weeks (after starting prednisone).   Let me know what you find out about the rheumatology referral when talking to neurology today.  I am happy to place that referral as well if needed.  If you do start prednisone for the myasthenia gravis, that may also help the rheumatoid issues.   Thank you for coming in today and stay safe.   If you have lab work done today you will be contacted with your lab results within the next 2 weeks.  If you have not heard from [place] then please contact us. The fastest way to get your results is to register for My Chart.   IF you received an x-ray today, you will receive an invoice from [facility]. Please contact [facility] at [phone] with questions or concerns regarding your invoice.   IF you received labwork today, you will receive an invoice from [place]. Please contact [facility] at [phone] with questions or concerns regarding your invoice.   Our billing staff will not be able to assist you with questions regarding bills from these companies.  You will be contacted with the lab results as soon as they are available. The fastest way to get your results is to activate your My Chart account. Instructions are located on the last page of this paperwork. If you have not heard from [place] regarding the results in 2 weeks, please contact this office.

## 2019-08-31 ENCOUNTER — Telehealth: Payer: Self-pay | Admitting: Diagnostic Neuroimaging

## 2019-08-31 NOTE — Telephone Encounter (Signed)
Patient returned call and stated last year the dr at hospital refered him to [facility] rheumatologist, but he was never seen. He did have telephone conversation with [facility] rheumatologist, and he stated the rheumatologist didn't think he had symptoms of RA. The patient never pursued FU at [facility]. He has never seen rheumatologist. I advised him of Dr Gladstone Lighter plan per Aug note re: Prednisone. Patient stated he saw PCP x 2 following FU with Dr Leta Baptist. A1c improved. He is ready for prednisone Rx to be sent. He understands he is to continue monitoring BS.   I advised will let Dr Leta Baptist know, and will call Dr Melissa Noon office to fax lab result and let referral dept know he never saw rheumatologist. Patient verbalized understanding, appreciation. Called [place] [facility], spoke with Anderson Malta, referrals and advised her I will fax lab result, and the patient has never seen rheum before. Anderson Malta verbalized understanding, appreciation, stated she will get all information together for MD to review. f [phone] CCP lab result faxed to [facility].

## 2019-08-31 NOTE — Telephone Encounter (Signed)
Pt called in and stated he was suppose to start Prednisone 20mg  will awaiting appt with Rheumatology , he states he hasnt heard anything from Rheumatology nor has he received the script for the Prednisone 20mg 

## 2019-08-31 NOTE — Telephone Encounter (Signed)
See [facility] lab 02/19/19. Prior rheumatology notes from [facility] medical. -[facility]P

## 2019-08-31 NOTE — Telephone Encounter (Signed)
Called [facility], spoke with Lattie Haw, med records who stated he was never seen. On 04/27/2018 a note stated patient would call back to schedule an appointment, however he never called back. Per care everywhere pt requested appt with rheumatology April 2020, was to have a telephone appt. Unsure if this ever occurred.  Called patient and LVM requesting he call back to discuss whether he ever saw a rheumatologist and the prednisone.

## 2019-08-31 NOTE — Telephone Encounter (Signed)
[facility], spoke with Cecille Rubin to check on August referral status. She  stated that they sent a letter to [facility] requesting the CCP labs and previous rheumatology notes. Will check on these requests.

## 2019-09-02 DIAGNOSIS — R5383 Other fatigue: Secondary | ICD-10-CM | POA: Diagnosis not present

## 2019-09-02 DIAGNOSIS — R0602 Shortness of breath: Secondary | ICD-10-CM | POA: Diagnosis not present

## 2019-09-02 DIAGNOSIS — M2550 Pain in unspecified joint: Secondary | ICD-10-CM | POA: Diagnosis not present

## 2019-09-02 DIAGNOSIS — M545 Low back pain: Secondary | ICD-10-CM | POA: Diagnosis not present

## 2019-09-02 DIAGNOSIS — M542 Cervicalgia: Secondary | ICD-10-CM | POA: Diagnosis not present

## 2019-09-02 DIAGNOSIS — Z6830 Body mass index (BMI) 30.0-30.9, adult: Secondary | ICD-10-CM | POA: Diagnosis not present

## 2019-09-02 DIAGNOSIS — E669 Obesity, unspecified: Secondary | ICD-10-CM | POA: Diagnosis not present

## 2019-09-02 DIAGNOSIS — G7000 Myasthenia gravis without (acute) exacerbation: Secondary | ICD-10-CM | POA: Diagnosis not present

## 2019-09-02 DIAGNOSIS — M064 Inflammatory polyarthropathy: Secondary | ICD-10-CM | POA: Diagnosis not present

## 2019-09-02 MED ORDER — PREDNISONE 10 MG PO TABS: 20.0000 mg | ORAL_TABLET | Freq: Every day | ORAL | 12 refills | Status: DC

## 2019-09-02 NOTE — Telephone Encounter (Signed)
Start prednisone 20mg  daily. Take calcium + vitamin D, prevacid (other other PPI antacid OTC).  Meds ordered this encounter  Medications  . predniSONE (DELTASONE) 10 MG tablet    Sig: Take 2 tablets (20 mg total) by mouth daily with breakfast.    Dispense:  60 tablet    Refill:  12    Penni Bombard, MD 53/04/6439, 3:47 PM Certified in Neurology, Neurophysiology and Neuroimaging  [facility] [address], [place], [place] [address] [phone]

## 2019-09-02 NOTE — Telephone Encounter (Signed)
LVM informing patient Dr Leta Baptist has prescribed prednisone, take 20 mg daily. I advised he also wants patient to take calcium +vit D , OTC and prevacid or similar OTC antacid. Left number for questions.

## 2019-09-07 DIAGNOSIS — H04123 Dry eye syndrome of bilateral lacrimal glands: Secondary | ICD-10-CM | POA: Diagnosis not present

## 2019-09-07 DIAGNOSIS — H532 Diplopia: Secondary | ICD-10-CM | POA: Diagnosis not present

## 2019-09-07 DIAGNOSIS — Z961 Presence of intraocular lens: Secondary | ICD-10-CM | POA: Diagnosis not present

## 2019-09-07 DIAGNOSIS — E119 Type 2 diabetes mellitus without complications: Secondary | ICD-10-CM | POA: Diagnosis not present

## 2019-09-07 LAB — HM DIABETES EYE EXAM

## 2019-09-10 ENCOUNTER — Encounter: Payer: Self-pay | Admitting: Family Medicine

## 2019-09-20 DIAGNOSIS — M0579 Rheumatoid arthritis with rheumatoid factor of multiple sites without organ or systems involvement: Secondary | ICD-10-CM | POA: Diagnosis not present

## 2019-09-20 DIAGNOSIS — G7000 Myasthenia gravis without (acute) exacerbation: Secondary | ICD-10-CM | POA: Diagnosis not present

## 2019-09-20 DIAGNOSIS — M150 Primary generalized (osteo)arthritis: Secondary | ICD-10-CM | POA: Diagnosis not present

## 2019-09-20 DIAGNOSIS — M4810 Ankylosing hyperostosis [Forestier], site unspecified: Secondary | ICD-10-CM | POA: Diagnosis not present

## 2019-09-20 DIAGNOSIS — M2550 Pain in unspecified joint: Secondary | ICD-10-CM | POA: Diagnosis not present

## 2019-09-20 DIAGNOSIS — E790 Hyperuricemia without signs of inflammatory arthritis and tophaceous disease: Secondary | ICD-10-CM | POA: Diagnosis not present

## 2019-09-23 ENCOUNTER — Encounter: Payer: Self-pay | Admitting: Family Medicine

## 2019-09-23 DIAGNOSIS — M069 Rheumatoid arthritis, unspecified: Secondary | ICD-10-CM | POA: Insufficient documentation

## 2019-09-30 ENCOUNTER — Telehealth: Payer: Self-pay | Admitting: *Deleted

## 2019-09-30 NOTE — Telephone Encounter (Signed)
Schedule AWV.  

## 2019-10-18 ENCOUNTER — Other Ambulatory Visit: Payer: Self-pay

## 2019-10-18 ENCOUNTER — Ambulatory Visit (INDEPENDENT_AMBULATORY_CARE_PROVIDER_SITE_OTHER): Payer: Medicare PPO | Admitting: Family Medicine

## 2019-10-18 VITALS — BP 128/78 | HR 93 | Temp 99.1°F | Wt 201.0 lb

## 2019-10-18 DIAGNOSIS — E1122 Type 2 diabetes mellitus with diabetic chronic kidney disease: Secondary | ICD-10-CM

## 2019-10-18 DIAGNOSIS — G7000 Myasthenia gravis without (acute) exacerbation: Secondary | ICD-10-CM | POA: Diagnosis not present

## 2019-10-18 DIAGNOSIS — R011 Cardiac murmur, unspecified: Secondary | ICD-10-CM | POA: Diagnosis not present

## 2019-10-18 DIAGNOSIS — M0579 Rheumatoid arthritis with rheumatoid factor of multiple sites without organ or systems involvement: Secondary | ICD-10-CM | POA: Diagnosis not present

## 2019-10-18 LAB — GLUCOSE, POCT (MANUAL RESULT ENTRY): POC Glucose: 196 mg/dl — AB (ref 70–99)

## 2019-10-18 LAB — POCT GLYCOSYLATED HEMOGLOBIN (HGB A1C): Hemoglobin A1C: 7 % — AB (ref 4.0–5.6)

## 2019-10-18 NOTE — Progress Notes (Signed)
Subjective:  Patient ID: Jonathan Mcgee, male    DOB: 09-11-1951  Age: 68 y.o. MRN: [id number]  CC:  Chief Complaint  Patient presents with  . Follow-up    f/u from 08/20/19 visit also need less expensive med for diabetes beside junuvia. Have been doubling up on his metformin to cover not being able to afford the other med. if blood sugar is appove 200  would take 2000mg  if below would take 1000mg     HPI Jonathan Mcgee presents for   Diabetes: Complicated by chronic kidney disease.  Also with recent start of prednisone for myasthenia gravis. Option of Januvia 50 mg daily at last visit versus self-monitoring if needed for cost.  He was continued on metformin 1000 mg twice daily. Initially started on prednisone 20 mg daily by neurology on October 8.  januvia was cost prohibitive, so if blood sugars over 200 - will take 2 additional metformin (currently on 1000mg  BID, adding additonal 2000mg  on 6 occasional only). Had some more diarrhea at higher dosing. Some diarrhea on twice per day metformin at 1000mg  BID.    Home readings:  Fastings: 97-130, single 202.  2-3hr PP: 85-251.  Overall average 148 past month.   Lab Results  Component Value Date   HGBA1C 7.2 (H) 07/22/2019   HGBA1C 8.4 (H) 02/20/2019   HGBA1C 8.5 (H) 02/19/2019   Lab Results  Component Value Date   LDLCALC 72 07/22/2019   CREATININE 1.56 (H) 07/22/2019   Heart murmur  Noted at rheumatologist visit.  Longstanding DOE, thought ot be due to myasthenia gravis - better with prednisone. More stamina.    History Patient Active Problem List   Diagnosis Date Noted  . Rheumatoid arthritis (HCC) 09/23/2019  . Morbid obesity (HCC) 07/19/2019  . UTI (urinary tract infection) 02/20/2019  . Suspected COVID-19 virus infection 02/20/2019  . Type 2 diabetes mellitus (HCC) 02/20/2019  . Sepsis (HCC) 02/19/2019  . CKD (chronic kidney disease) 04/12/2017  . Essential hypertension 01/13/2017  . Hyperlipidemia 01/13/2017  .  Myasthenia gravis (HCC) 08/28/2016   Past Medical History:  Diagnosis Date  . Arthritis   . Diabetes mellitus without complication (HCC)   . Hypercholesterolemia   . Hypertension   . Myasthenia gravis (HCC)   . Rheumatoid arthritis [facility](HCC)    Past Surgical History:  Procedure Laterality Date  . CATARACT EXTRACTION, BILATERAL  2020  . KNEE SURGERY Right    fractured patella > 20 yrs ago  . PENILE PROSTHESIS IMPLANT  2015   Allergies  Allergen Reactions  . Lisinopril Swelling    Facial swelling   Prior to Admission medications   Medication Sig Start Date End Date Taking? Authorizing Provider  amLODipine (NORVASC) 5 MG tablet TAKE 1 & 1/2 (ONE & ONE-HALF) TABLETS BY MOUTH ONCE DAILY 07/19/19  Yes Shade FloodGreene, Avenly Roberge R, MD  atorvastatin (LIPITOR) 20 MG tablet Take 1 tablet (20 mg total) by mouth daily. 07/19/19  Yes Shade FloodGreene, Melecio Cueto R, MD  hydrochlorothiazide (HYDRODIURIL) 25 MG tablet Take 1 tablet (25 mg total) by mouth daily. 07/19/19  Yes Shade FloodGreene, Rodarius Kichline R, MD  losartan (COZAAR) 100 MG tablet Take 1 tablet (100 mg total) by mouth daily. 07/19/19  Yes Shade FloodGreene, Xavyer Steenson R, MD  metFORMIN (GLUCOPHAGE) 1000 MG tablet Take 1 tablet (1,000 mg total) by mouth 2 (two) times daily with a meal. 07/19/19  Yes Shade FloodGreene, Daviana Haymaker R, MD  Methotrexate 2.5 MG/ML SOLN  09/24/19  Yes [provider]  predniSONE (DELTASONE) 10 MG tablet Take 2  tablets (20 mg total) by mouth daily with breakfast. 09/02/19  Yes Penumalli, Glenford Bayley, MD  pyridostigmine (MESTINON) 60 MG tablet Take 1 tablet (60 mg total) by mouth 3 (three) times daily. 07/19/19  Yes Penumalli, Glenford Bayley, MD  folic acid (FOLVITE) 1 MG tablet     [provider]  sitaGLIPtin (JANUVIA) 50 MG tablet Take 2 tablets (100 mg total) by mouth daily. Patient not taking: Reported on 10/18/2019 08/20/19   Shade Flood, MD   Social History   Socioeconomic History  . Marital status: Single    Spouse name: Not on file  . Number of children: 1   . Years of education: 32  . Highest education level: Not on file  Occupational History    Comment: [employer]  Social Needs  . Financial resource strain: Not on file  . Food insecurity    Worry: Not on file    Inability: Not on file  . Transportation needs    Medical: Not on file    Non-medical: Not on file  Tobacco Use  . Smoking status: Current Some Day Smoker    Packs/day: 1.00    Types: Cigars  . Smokeless tobacco: Never Used  Substance and Sexual Activity  . Alcohol use: Yes    Comment: occas  . Drug use: No  . Sexual activity: Not on file  Lifestyle  . Physical activity    Days per week: Not on file    Minutes per session: Not on file  . Stress: Not on file  Relationships  . Social Musician on phone: Not on file    Gets together: Not on file    Attends religious service: Not on file    Active member of club or organization: Not on file    Attends meetings of clubs or organizations: Not on file    Relationship status: Not on file  . Intimate partner violence    Fear of current or ex partner: Not on file    Emotionally abused: Not on file    Physically abused: Not on file    Forced sexual activity: Not on file  Other Topics Concern  . Not on file  Social History Narrative   Drinks 2-3 caffeine drinks a day     Review of Systems   Objective:   Vitals:   10/18/19 1141  BP: 128/78  Pulse: 93  Temp: 99.1 F (37.3 C)  TempSrc: Oral  SpO2: 96%  Weight: 201 lb (91.2 kg)     Physical Exam Vitals signs reviewed.  Constitutional:      Appearance: He is well-developed.  HENT:     Head: Normocephalic and atraumatic.  Eyes:     Pupils: Pupils are equal, round, and reactive to light.  Neck:     Vascular: No carotid bruit or JVD.  Cardiovascular:     Rate and Rhythm: Normal rate and regular rhythm.     Heart sounds: Murmur (1-2/6 at LLSB. ) present.  Pulmonary:     Effort: Pulmonary effort is normal.     Breath sounds: Normal breath sounds. No  rales.  Skin:    General: Skin is warm and dry.  Neurological:     Mental Status: He is alert and oriented to person, place, and time.       Assessment & Plan:  Marvel Mcphillips is a 68 y.o. male . Type 2 diabetes mellitus with chronic kidney disease, without long-term current use of insulin, unspecified  CKD stage (HCC) - Plan: POCT glucose (manual entry), POCT glycosylated hemoglobin (Hb A1C) MG (myasthenia gravis) (HCC)  -Overall stable control based on current A1c, even on prednisone.  Decided against adding additional medication to Metformin, and cautioned against any higher doses than 1000 mg twice daily.  Potentially could lower dose if persistent diarrhea and add DPP4, or SGLT2.  Home monitoring with RTC precautions given  Heart murmur - Plan: Ambulatory referral to Cardiology  -Faint heart murmur, will have evaluated with cardiology, asymptomatic.  No orders of the defined types were placed in this encounter.  Patient Instructions     Avoid any higher doses of metformin than 1000mg  twice per day. Variation in readings may be due to diet. Continue the good work with watching what you eat, but see info below for more guidance. If continued diarrhea at current dose of metformin, we can try lower dose with other med.  No changes for now, but if PERSISTENT readings in 200's, let me know.  recheck in 3 months.   I will refer you to cardiologist for the heart murmur.    Return to the clinic or go to the nearest emergency room if any of your symptoms worsen or new symptoms occur.    Diabetes Mellitus and Nutrition, Adult When you have diabetes (diabetes mellitus), it is very important to have healthy eating habits because your blood sugar (glucose) levels are greatly affected by what you eat and drink. Eating healthy foods in the appropriate amounts, at about the same times every day, can help you:  Control your blood glucose.  Lower your risk of heart disease.  Improve your  blood pressure.  Reach or maintain a healthy weight. Every person with diabetes is different, and each person has different needs for a meal plan. Your health care provider may recommend that you work with a diet and nutrition specialist (dietitian) to make a meal plan that is best for you. Your meal plan may vary depending on factors such as:  The calories you need.  The medicines you take.  Your weight.  Your blood glucose, blood pressure, and cholesterol levels.  Your activity level.  Other health conditions you have, such as heart or kidney disease. How do carbohydrates affect me? Carbohydrates, also called carbs, affect your blood glucose level more than any other type of food. Eating carbs naturally raises the amount of glucose in your blood. Carb counting is a method for keeping track of how many carbs you eat. Counting carbs is important to keep your blood glucose at a healthy level, especially if you use insulin or take certain oral diabetes medicines. It is important to know how many carbs you can safely have in each meal. This is different for every person. Your dietitian can help you calculate how many carbs you should have at each meal and for each snack. Foods that contain carbs include:  Bread, cereal, rice, pasta, and crackers.  Potatoes and corn.  Peas, beans, and lentils.  Milk and yogurt.  Fruit and juice.  Desserts, such as cakes, cookies, ice cream, and candy. How does alcohol affect me? Alcohol can cause a sudden decrease in blood glucose (hypoglycemia), especially if you use insulin or take certain oral diabetes medicines. Hypoglycemia can be a life-threatening condition. Symptoms of hypoglycemia (sleepiness, dizziness, and confusion) are similar to symptoms of having too much alcohol. If your health care provider says that alcohol is safe for you, follow these guidelines:  Limit alcohol intake to no  more than 1 drink per day for nonpregnant women and 2  drinks per day for men. One drink equals 12 oz of beer, 5 oz of wine, or 1 oz of hard liquor.  Do not drink on an empty stomach.  Keep yourself hydrated with water, diet soda, or unsweetened iced tea.  Keep in mind that regular soda, juice, and other mixers may contain a lot of sugar and must be counted as carbs. What are tips for following this plan?  Reading food labels  Start by checking the serving size on the "Nutrition Facts" label of packaged foods and drinks. The amount of calories, carbs, fats, and other nutrients listed on the label is based on one serving of the item. Many items contain more than one serving per package.  Check the total grams (g) of carbs in one serving. You can calculate the number of servings of carbs in one serving by dividing the total carbs by 15. For example, if a food has 30 g of total carbs, it would be equal to 2 servings of carbs.  Check the number of grams (g) of saturated and trans fats in one serving. Choose foods that have low or no amount of these fats.  Check the number of milligrams (mg) of salt (sodium) in one serving. Most people should limit total sodium intake to less than 2,300 mg per day.  Always check the nutrition information of foods labeled as "low-fat" or "nonfat". These foods may be higher in added sugar or refined carbs and should be avoided.  Talk to your dietitian to identify your daily goals for nutrients listed on the label. Shopping  Avoid buying canned, premade, or processed foods. These foods tend to be high in fat, sodium, and added sugar.  Shop around the outside edge of the grocery store. This includes fresh fruits and vegetables, bulk grains, fresh meats, and fresh dairy. Cooking  Use low-heat cooking methods, such as baking, instead of high-heat cooking methods like deep frying.  Cook using healthy oils, such as olive, canola, or sunflower oil.  Avoid cooking with butter, cream, or high-fat meats. Meal planning   Eat meals and snacks regularly, preferably at the same times every day. Avoid going long periods of time without eating.  Eat foods high in fiber, such as fresh fruits, vegetables, beans, and whole grains. Talk to your dietitian about how many servings of carbs you can eat at each meal.  Eat 4-6 ounces (oz) of lean protein each day, such as lean meat, chicken, fish, eggs, or tofu. One oz of lean protein is equal to: ? 1 oz of meat, chicken, or fish. ? 1 egg. ?  cup of tofu.  Eat some foods each day that contain healthy fats, such as avocado, nuts, seeds, and fish. Lifestyle  Check your blood glucose regularly.  Exercise regularly as told by your health care provider. This may include: ? 150 minutes of moderate-intensity or vigorous-intensity exercise each week. This could be brisk walking, biking, or water aerobics. ? Stretching and doing strength exercises, such as yoga or weightlifting, at least 2 times a week.  Take medicines as told by your health care provider.  Do not use any products that contain nicotine or tobacco, such as cigarettes and e-cigarettes. If you need help quitting, ask your health care provider.  Work with a Veterinary surgeoncounselor or diabetes educator to identify strategies to manage stress and any emotional and social challenges. Questions to ask a health care provider  Do  I need to meet with a diabetes educator?  Do I need to meet with a dietitian?  What number can I call if I have questions?  When are the best times to check my blood glucose? Where to find more information:  American Diabetes Association: diabetes.org  Academy of Nutrition and Dietetics: www.eatright.[employer] of Diabetes and Digestive and Kidney Diseases (NIH): [employer].tn Summary  A healthy meal plan will help you control your blood glucose and maintain a healthy lifestyle.  Working with a diet and nutrition specialist (dietitian) can help you make a meal plan that is  best for you.  Keep in mind that carbohydrates (carbs) and alcohol have immediate effects on your blood glucose levels. It is important to count carbs and to use alcohol carefully. This information is not intended to replace advice given to you by your health care provider. Make sure you discuss any questions you have with your health care provider. Document Released: 08/08/2005 Document Revised: 10/24/2017 Document Reviewed: 12/16/2016 Elsevier Patient Education  [employer].   If you have lab work done today you will be contacted with your lab results within the next 2 weeks.  If you have not heard from [place] then please contact us. The fastest way to get your results is to register for My Chart.   IF you received an x-ray today, you will receive an invoice from [facility]. Please contact [facility] at [phone] with questions or concerns regarding your invoice.   IF you received labwork today, you will receive an invoice from [place]. Please contact [facility] at [phone] with questions or concerns regarding your invoice.   Our billing staff will not be able to assist you with questions regarding bills from these companies.  You will be contacted with the lab results as soon as they are available. The fastest way to get your results is to activate your My Chart account. Instructions are located on the last page of this paperwork. If you have not heard from [place] regarding the results in 2 weeks, please contact this office.          Signed, [name], MD [facility]

## 2019-10-18 NOTE — Patient Instructions (Addendum)
Avoid any higher doses of metformin than 1000mg  twice per day. Variation in readings may be due to diet. Continue the good work with watching what you eat, but see info below for more guidance. If continued diarrhea at current dose of metformin, we can try lower dose with other med.  No changes for now, but if PERSISTENT readings in 200's, let me know.  recheck in 3 months.   I will refer you to cardiologist for the heart murmur.    Return to the clinic or go to the nearest emergency room if any of your symptoms worsen or new symptoms occur.    Diabetes Mellitus and Nutrition, Adult When you have diabetes (diabetes mellitus), it is very important to have healthy eating habits because your blood sugar (glucose) levels are greatly affected by what you eat and drink. Eating healthy foods in the appropriate amounts, at about the same times every day, can help you:  Control your blood glucose.  Lower your risk of heart disease.  Improve your blood pressure.  Reach or maintain a healthy weight. Every person with diabetes is different, and each person has different needs for a meal plan. Your health care provider may recommend that you work with a diet and nutrition specialist (dietitian) to make a meal plan that is best for you. Your meal plan may vary depending on factors such as:  The calories you need.  The medicines you take.  Your weight.  Your blood glucose, blood pressure, and cholesterol levels.  Your activity level.  Other health conditions you have, such as heart or kidney disease. How do carbohydrates affect me? Carbohydrates, also called carbs, affect your blood glucose level more than any other type of food. Eating carbs naturally raises the amount of glucose in your blood. Carb counting is a method for keeping track of how many carbs you eat. Counting carbs is important to keep your blood glucose at a healthy level, especially if you use insulin or take certain oral  diabetes medicines. It is important to know how many carbs you can safely have in each meal. This is different for every person. Your dietitian can help you calculate how many carbs you should have at each meal and for each snack. Foods that contain carbs include:  Bread, cereal, rice, pasta, and crackers.  Potatoes and corn.  Peas, beans, and lentils.  Milk and yogurt.  Fruit and juice.  Desserts, such as cakes, cookies, ice cream, and candy. How does alcohol affect me? Alcohol can cause a sudden decrease in blood glucose (hypoglycemia), especially if you use insulin or take certain oral diabetes medicines. Hypoglycemia can be a life-threatening condition. Symptoms of hypoglycemia (sleepiness, dizziness, and confusion) are similar to symptoms of having too much alcohol. If your health care provider says that alcohol is safe for you, follow these guidelines:  Limit alcohol intake to no more than 1 drink per day for nonpregnant women and 2 drinks per day for men. One drink equals 12 oz of beer, 5 oz of wine, or 1 oz of hard liquor.  Do not drink on an empty stomach.  Keep yourself hydrated with water, diet soda, or unsweetened iced tea.  Keep in mind that regular soda, juice, and other mixers may contain a lot of sugar and must be counted as carbs. What are tips for following this plan?  Reading food labels  Start by checking the serving size on the "Nutrition Facts" label of packaged foods and drinks. The  amount of calories, carbs, fats, and other nutrients listed on the label is based on one serving of the item. Many items contain more than one serving per package.  Check the total grams (g) of carbs in one serving. You can calculate the number of servings of carbs in one serving by dividing the total carbs by 15. For example, if a food has 30 g of total carbs, it would be equal to 2 servings of carbs.  Check the number of grams (g) of saturated and trans fats in one serving.  Choose foods that have low or no amount of these fats.  Check the number of milligrams (mg) of salt (sodium) in one serving. Most people should limit total sodium intake to less than 2,300 mg per day.  Always check the nutrition information of foods labeled as "low-fat" or "nonfat". These foods may be higher in added sugar or refined carbs and should be avoided.  Talk to your dietitian to identify your daily goals for nutrients listed on the label. Shopping  Avoid buying canned, premade, or processed foods. These foods tend to be high in fat, sodium, and added sugar.  Shop around the outside edge of the grocery store. This includes fresh fruits and vegetables, bulk grains, fresh meats, and fresh dairy. Cooking  Use low-heat cooking methods, such as baking, instead of high-heat cooking methods like deep frying.  Cook using healthy oils, such as olive, canola, or sunflower oil.  Avoid cooking with butter, cream, or high-fat meats. Meal planning  Eat meals and snacks regularly, preferably at the same times every day. Avoid going long periods of time without eating.  Eat foods high in fiber, such as fresh fruits, vegetables, beans, and whole grains. Talk to your dietitian about how many servings of carbs you can eat at each meal.  Eat 4-6 ounces (oz) of lean protein each day, such as lean meat, chicken, fish, eggs, or tofu. One oz of lean protein is equal to: ? 1 oz of meat, chicken, or fish. ? 1 egg. ?  cup of tofu.  Eat some foods each day that contain healthy fats, such as avocado, nuts, seeds, and fish. Lifestyle  Check your blood glucose regularly.  Exercise regularly as told by your health care provider. This may include: ? 150 minutes of moderate-intensity or vigorous-intensity exercise each week. This could be brisk walking, biking, or water aerobics. ? Stretching and doing strength exercises, such as yoga or weightlifting, at least 2 times a week.  Take medicines as told  by your health care provider.  Do not use any products that contain nicotine or tobacco, such as cigarettes and e-cigarettes. If you need help quitting, ask your health care provider.  Work with a Social worker or diabetes educator to identify strategies to manage stress and any emotional and social challenges. Questions to ask a health care provider  Do I need to meet with a diabetes educator?  Do I need to meet with a dietitian?  What number can I call if I have questions?  When are the best times to check my blood glucose? Where to find more information:  American Diabetes Association: diabetes.org  Academy of Nutrition and Dietetics: www.eatright.[employer] of Diabetes and Digestive and Kidney Diseases (NIH): [employer] Summary  A healthy meal plan will help you control your blood glucose and maintain a healthy lifestyle.  Working with a diet and nutrition specialist (dietitian) can help you make a meal plan that is best for  you.  Keep in mind that carbohydrates (carbs) and alcohol have immediate effects on your blood glucose levels. It is important to count carbs and to use alcohol carefully. This information is not intended to replace advice given to you by your health care provider. Make sure you discuss any questions you have with your health care provider. Document Released: 08/08/2005 Document Revised: 10/24/2017 Document Reviewed: 12/16/2016 Elsevier Patient Education  [employer].   If you have lab work done today you will be contacted with your lab results within the next 2 weeks.  If you have not heard from [place] then please contact us. The fastest way to get your results is to register for My Chart.   IF you received an x-ray today, you will receive an invoice from [facility]. Please contact [facility] Radiology at [phone] with questions or concerns regarding your invoice.   IF you received labwork today, you will receive an  invoice from [place]. Please contact [facility] at [phone] with questions or concerns regarding your invoice.   Our billing staff will not be able to assist you with questions regarding bills from these companies.  You will be contacted with the lab results as soon as they are available. The fastest way to get your results is to activate your My Chart account. Instructions are located on the last page of this paperwork. If you have not heard from [place] regarding the results in 2 weeks, please contact this office.

## 2019-10-19 ENCOUNTER — Encounter: Payer: Self-pay | Admitting: Family Medicine

## 2019-11-09 ENCOUNTER — Encounter: Payer: Self-pay | Admitting: Family Medicine

## 2019-11-13 ENCOUNTER — Other Ambulatory Visit: Payer: Self-pay | Admitting: Family Medicine

## 2019-11-13 MED ORDER — GLIPIZIDE 5 MG PO TABS: 2.5000 mg | ORAL_TABLET | Freq: Every day | ORAL | 1 refills | Status: DC

## 2019-11-24 ENCOUNTER — Other Ambulatory Visit: Payer: Self-pay | Admitting: Family Medicine

## 2019-11-24 DIAGNOSIS — I10 Essential (primary) hypertension: Secondary | ICD-10-CM

## 2019-11-30 DIAGNOSIS — E790 Hyperuricemia without signs of inflammatory arthritis and tophaceous disease: Secondary | ICD-10-CM | POA: Diagnosis not present

## 2019-11-30 DIAGNOSIS — M150 Primary generalized (osteo)arthritis: Secondary | ICD-10-CM | POA: Diagnosis not present

## 2019-11-30 DIAGNOSIS — M0579 Rheumatoid arthritis with rheumatoid factor of multiple sites without organ or systems involvement: Secondary | ICD-10-CM | POA: Diagnosis not present

## 2019-11-30 DIAGNOSIS — G7000 Myasthenia gravis without (acute) exacerbation: Secondary | ICD-10-CM | POA: Diagnosis not present

## 2019-11-30 DIAGNOSIS — E669 Obesity, unspecified: Secondary | ICD-10-CM | POA: Diagnosis not present

## 2019-11-30 DIAGNOSIS — M4810 Ankylosing hyperostosis [Forestier], site unspecified: Secondary | ICD-10-CM | POA: Diagnosis not present

## 2019-11-30 DIAGNOSIS — Z6830 Body mass index (BMI) 30.0-30.9, adult: Secondary | ICD-10-CM | POA: Diagnosis not present

## 2019-11-30 DIAGNOSIS — M2550 Pain in unspecified joint: Secondary | ICD-10-CM | POA: Diagnosis not present

## 2019-12-28 DIAGNOSIS — M0579 Rheumatoid arthritis with rheumatoid factor of multiple sites without organ or systems involvement: Secondary | ICD-10-CM | POA: Diagnosis not present

## 2019-12-30 ENCOUNTER — Ambulatory Visit (INDEPENDENT_AMBULATORY_CARE_PROVIDER_SITE_OTHER): Payer: Medicare PPO | Admitting: Family Medicine

## 2019-12-30 VITALS — BP 128/78 | Ht 69.0 in | Wt 201.0 lb

## 2019-12-30 DIAGNOSIS — Z0000 Encounter for general adult medical examination without abnormal findings: Secondary | ICD-10-CM

## 2019-12-30 NOTE — Patient Instructions (Signed)
Thank you for taking time to come for your Medicare Wellness Visit. I appreciate your ongoing commitment to your health goals. Please review the following plan we discussed and let me know if I can assist you in the future.  Jonathan Kennedy LPN  Preventive Care 64 Years and Older, Male Preventive care refers to lifestyle choices and visits with your health care provider that can promote health and wellness. This includes:  A yearly physical exam. This is also called an annual well check.  Regular dental and eye exams.  Immunizations.  Screening for certain conditions.  Healthy lifestyle choices, such as diet and exercise. What can I expect for my preventive care visit? Physical exam Your health care provider will check:  Height and weight. These may be used to calculate body mass index (BMI), which is a measurement that tells if you are at a healthy weight.  Heart rate and blood pressure.  Your skin for abnormal spots. Counseling Your health care provider may ask you questions about:  Alcohol, tobacco, and drug use.  Emotional well-being.  Home and relationship well-being.  Sexual activity.  Eating habits.  History of falls.  Memory and ability to understand (cognition).  Work and work Statistician. What immunizations do I need?  Influenza (flu) vaccine  This is recommended every year. Tetanus, diphtheria, and pertussis (Tdap) vaccine  You may need a Td booster every 10 years. Varicella (chickenpox) vaccine  You may need this vaccine if you have not already been vaccinated. Zoster (shingles) vaccine  You may need this after age 66. Pneumococcal conjugate (PCV13) vaccine  One dose is recommended after age 84. Pneumococcal polysaccharide (PPSV23) vaccine  One dose is recommended after age 88. Measles, mumps, and rubella (MMR) vaccine  You may need at least one dose of MMR if you were born in 1957 or later. You may also need a second dose. Meningococcal  conjugate (MenACWY) vaccine  You may need this if you have certain conditions. Hepatitis A vaccine  You may need this if you have certain conditions or if you travel or work in places where you may be exposed to hepatitis A. Hepatitis B vaccine  You may need this if you have certain conditions or if you travel or work in places where you may be exposed to hepatitis B. Haemophilus influenzae type b (Hib) vaccine  You may need this if you have certain conditions. You may receive vaccines as individual doses or as more than one vaccine together in one shot (combination vaccines). Talk with your health care provider about the risks and benefits of combination vaccines. What tests do I need? Blood tests  Lipid and cholesterol levels. These may be checked every 5 years, or more frequently depending on your overall health.  Hepatitis C test.  Hepatitis B test. Screening  Lung cancer screening. You may have this screening every year starting at age 24 if you have a 30-pack-year history of smoking and currently smoke or have quit within the past 15 years.  Colorectal cancer screening. All adults should have this screening starting at age 52 and continuing until age 61. Your health care provider may recommend screening at age 57 if you are at increased risk. You will have tests every 1-10 years, depending on your results and the type of screening test.  Prostate cancer screening. Recommendations will vary depending on your family history and other risks.  Diabetes screening. This is done by checking your blood sugar (glucose) after you have not eaten for  a while (fasting). You may have this done every 1-3 years.  Abdominal aortic aneurysm (AAA) screening. You may need this if you are a current or former smoker.  Sexually transmitted disease (STD) testing. Follow these instructions at home: Eating and drinking  Eat a diet that includes fresh fruits and vegetables, whole grains, lean  protein, and low-fat dairy products. Limit your intake of foods with high amounts of sugar, saturated fats, and salt.  Take vitamin and mineral supplements as recommended by your health care provider.  Do not drink alcohol if your health care provider tells you not to drink.  If you drink alcohol: ? Limit how much you have to 0-2 drinks a day. ? Be aware of how much alcohol is in your drink. In the [place] one drink equals one 12 oz bottle of beer (355 mL), one 5 oz glass of wine (148 mL), or one 1 oz glass of hard liquor (44 mL). Lifestyle  Take daily care of your teeth and gums.  Stay active. Exercise for at least 30 minutes on 5 or more days each week.  Do not use any products that contain nicotine or tobacco, such as cigarettes, e-cigarettes, and chewing tobacco. If you need help quitting, ask your health care provider.  If you are sexually active, practice safe sex. Use a condom or other form of protection to prevent STIs (sexually transmitted infections).  Talk with your health care provider about taking a low-dose aspirin or statin. What's next?  Visit your health care provider once a year for a well check visit.  Ask your health care provider how often you should have your eyes and teeth checked.  Stay up to date on all vaccines. This information is not intended to replace advice given to you by your health care provider. Make sure you discuss any questions you have with your health care provider. Document Revised: 11/05/2018 Document Reviewed: 11/05/2018 Elsevier Patient Education  2020 Elsevier Inc.  

## 2019-12-30 NOTE — Progress Notes (Signed)
Presents today for [employer] Visit   Date of last exam:10/18/2019  Interpreter used for this visit?   No I connected with  Jonathan Mcgee on 12/30/19 by a telephone and verified that I am speaking with the correct person using two identifiers.   I discussed the limitations of evaluation and management by telemedicine. The patient expressed understanding and agreed to proceed.    Patient Care Team: Shade Flood, MD as PCP - General (Family Medicine)   Other items to address today:   Discussed immunizations Discussed Eye/Dental Follow up scheduled Dr. Neva Seat 2-24 @ 8:40 am Patient is on wait list for the vaccine    Other Screening: Last screening for diabetes: 07/22/2019 Last lipid screening: 07/22/19  ADVANCE DIRECTIVES: Discussed: yes  On File: no Materials Provided: yes  Immunization status:  Immunization History  Administered Date(s) Administered  . Hepatitis B, adult 07/18/2016     Health Maintenance Due  Topic Date Due  . Jonathan Mcgee  07/17/1970     Functional Status Survey: Is the patient deaf or have difficulty hearing?: No Does the patient have difficulty seeing, even when wearing glasses/contacts?: No Does the patient have difficulty concentrating, remembering, or making decisions?: No Does the patient have difficulty walking or climbing stairs?: Yes Does the patient have difficulty dressing or bathing?: No Does the patient have difficulty doing errands alone such as visiting a doctor's office or shopping?: No   6CIT Screen 12/30/2019  What Year? 0 points  What month? 0 points  What time? 0 points  Count back from 20 0 points  Months in reverse 0 points  Repeat phrase 2 points  Total Score 2        Clinical Support from 12/30/2019 in [facility]  AUDIT-C Score  0       Home Environment:   Lives in one story home Yes trouble climbing stairs No scattered rugs No grabs Adequate lighting/ no  clutter   Patient Active Problem List   Diagnosis Date Noted  . Rheumatoid arthritis (HCC) 09/23/2019  . Morbid obesity (HCC) 07/19/2019  . UTI (urinary tract infection) 02/20/2019  . Suspected COVID-19 virus infection 02/20/2019  . Type 2 diabetes mellitus (HCC) 02/20/2019  . Sepsis (HCC) 02/19/2019  . CKD (chronic kidney disease) 04/12/2017  . Essential hypertension 01/13/2017  . Hyperlipidemia 01/13/2017  . Myasthenia gravis (HCC) 08/28/2016     Past Medical History:  Diagnosis Date  . Arthritis   . Diabetes mellitus without complication (HCC)   . Hypercholesterolemia   . Hypertension   . Myasthenia gravis (HCC)   . Rheumatoid arthritis [facility])      Past Surgical History:  Procedure Laterality Date  . CATARACT EXTRACTION, BILATERAL  2020  . KNEE SURGERY Right    fractured patella > 20 yrs ago  . PENILE PROSTHESIS IMPLANT  2015     Family History  Problem Relation Age of Onset  . Diabetes Mother   . Heart disease Mother   . Hyperlipidemia Mother   . Heart attack Mother   . Hyperlipidemia Father   . Kidney failure Father   . Drug abuse Maternal Grandmother   . Heart disease Maternal Grandmother   . Hypertension Maternal Grandmother   . Hyperlipidemia Maternal Grandmother   . Diabetes Maternal Grandmother      Social History   Socioeconomic History  . Marital status: Single    Spouse name: Not on file  . Number of children: 1  .  Years of education: 84  . Highest education level: Not on file  Occupational History    Comment: [employer]  Tobacco Use  . Smoking status: Current Some Day Smoker    Packs/day: 1.00    Types: Cigars  . Smokeless tobacco: Never Used  Substance and Sexual Activity  . Alcohol use: Yes    Comment: occas  . Drug use: No  . Sexual activity: Not on file  Other Topics Concern  . Not on file  Social History Narrative   Drinks 2-3 caffeine drinks a day    Social Determinants of Health   Financial Resource Strain:   . Difficulty  of Paying Living Expenses: Not on file  Food Insecurity:   . Worried About Charity fundraiser in the Last Year: Not on file  . Ran Out of Food in the Last Year: Not on file  Transportation Needs:   . Lack of Transportation (Medical): Not on file  . Lack of Transportation (Non-Medical): Not on file  Physical Activity:   . Days of Exercise per Week: Not on file  . Minutes of Exercise per Session: Not on file  Stress:   . Feeling of Stress : Not on file  Social Connections:   . Frequency of Communication with Friends and Family: Not on file  . Frequency of Social Gatherings with Friends and Family: Not on file  . Attends Religious Services: Not on file  . Active Member of Clubs or Organizations: Not on file  . Attends Archivist Meetings: Not on file  . Marital Status: Not on file  Intimate Partner Violence:   . Fear of Current or Ex-Partner: Not on file  . Emotionally Abused: Not on file  . Physically Abused: Not on file  . Sexually Abused: Not on file     Allergies  Allergen Reactions  . Lisinopril Swelling    Facial swelling     Prior to Admission medications   Medication Sig Start Date End Date Taking? Authorizing Provider  amLODipine (NORVASC) 5 MG tablet TAKE 1 & 1/2 (ONE & ONE-HALF) TABLETS BY MOUTH ONCE DAILY 11/24/19  Yes Wendie Agreste, MD  atorvastatin (LIPITOR) 20 MG tablet Take 1 tablet (20 mg total) by mouth daily. 07/19/19  Yes Wendie Agreste, MD  folic acid (FOLVITE) 1 MG tablet    Yes [provider]  glipiZIDE (GLUCOTROL) 5 MG tablet Take 0.5 tablets (2.5 mg total) by mouth daily before breakfast. 11/13/19  Yes Wendie Agreste, MD  hydrochlorothiazide (HYDRODIURIL) 25 MG tablet Take 1 tablet (25 mg total) by mouth daily. 07/19/19  Yes Wendie Agreste, MD  losartan (COZAAR) 100 MG tablet Take 1 tablet (100 mg total) by mouth daily. 07/19/19  Yes Wendie Agreste, MD  metFORMIN (GLUCOPHAGE) 1000 MG tablet Take 1 tablet (1,000 mg  total) by mouth 2 (two) times daily with a meal. 07/19/19  Yes Wendie Agreste, MD  Methotrexate 2.5 MG/ML SOLN  09/24/19  Yes [provider]  predniSONE (DELTASONE) 10 MG tablet Take 2 tablets (20 mg total) by mouth daily with breakfast. 09/02/19  Yes Penumalli, Earlean Polka, MD  pyridostigmine (MESTINON) 60 MG tablet Take 1 tablet (60 mg total) by mouth 3 (three) times daily. 07/19/19  Yes Penni Bombard, MD     Depression screen [facility] 2/9 12/30/2019 10/18/2019 08/20/2019 07/22/2019 07/22/2019  Decreased Interest 0 0 0 0 0  Down, Depressed, Hopeless 0 0 0 0 0  PHQ - 2 Score  0 0 0 0 0     Fall Risk  12/30/2019 10/18/2019 08/20/2019 07/22/2019 07/22/2019  Falls in the past year? 0 0 0 0 0  Number falls in past yr: 0 0 0 0 0  Injury with Fall? 0 0 0 0 -  Follow up Falls evaluation completed;Education provided Falls evaluation completed Falls evaluation completed Falls evaluation completed Falls evaluation completed      PHYSICAL EXAM: BP 128/78 Comment: taken from a previous visit  Ht 5\' 9"  (1.753 m)   Wt 201 lb (91.2 kg)   BMI 29.68 kg/m    Wt Readings from Last 3 Encounters:  12/30/19 201 lb (91.2 kg)  10/18/19 201 lb (91.2 kg)  08/20/19 207 lb 6.4 oz (94.1 kg)       Education/Counseling provided regarding diet and exercise, prevention of chronic diseases, smoking/tobacco cessation, if applicable, and reviewed "Covered Medicare Preventive Services."

## 2020-01-09 ENCOUNTER — Ambulatory Visit: Payer: Medicare PPO | Attending: Internal Medicine

## 2020-01-09 DIAGNOSIS — Z23 Encounter for immunization: Secondary | ICD-10-CM | POA: Insufficient documentation

## 2020-01-09 NOTE — Progress Notes (Signed)
   Covid-19 Vaccination Clinic  Name:  Jonathan Mcgee    MRN: [id number] DOB: October 23, 1951  01/09/2020  Jonathan Mcgee was observed post Covid-19 immunization for 15 minutes without incidence. He was provided with Vaccine Information Sheet and instruction to access the V-Safe system.   Jonathan Mcgee was instructed to call 911 with any severe reactions post vaccine: Marland Kitchen Difficulty breathing  . Swelling of your face and throat  . A fast heartbeat  . A bad rash all over your body  . Dizziness and weakness    Immunizations Administered    Name Date Dose VIS Date Route   Pfizer COVID-19 Vaccine 01/09/2020  2:42 PM 0.3 mL 11/05/2019 Intramuscular   Manufacturer: [employer], [employer]   Lot: [id number]   NDC: [address]-7

## 2020-01-18 ENCOUNTER — Encounter: Payer: Self-pay | Admitting: Diagnostic Neuroimaging

## 2020-01-18 ENCOUNTER — Ambulatory Visit: Payer: Medicare PPO | Admitting: Diagnostic Neuroimaging

## 2020-01-18 ENCOUNTER — Other Ambulatory Visit: Payer: Self-pay

## 2020-01-18 VITALS — BP 130/58 | HR 72 | Temp 96.5°F | Ht 69.0 in | Wt 205.0 lb

## 2020-01-18 DIAGNOSIS — G7000 Myasthenia gravis without (acute) exacerbation: Secondary | ICD-10-CM | POA: Diagnosis not present

## 2020-01-18 MED ORDER — PREDNISONE 5 MG PO TABS: 15.0000 mg | ORAL_TABLET | Freq: Every day | ORAL | 12 refills | Status: DC

## 2020-01-18 NOTE — Patient Instructions (Signed)
-   reduce prednisone to 15mg  daily

## 2020-01-18 NOTE — Progress Notes (Signed)
[facility]  PATIENT: Jonathan Mcgee DOB: Dec 02, 1950  REFERRING CLINICIAN: Deliah Boston, PA-c HISTORY FROM: patient  REASON FOR VISIT: follow up    HISTORICAL  CHIEF COMPLAINT:  Chief Complaint  Patient presents with  . Myasthenia Gravis    rm 6, 6 month FU, "my RA dr wants me to dicuss reducing prednisone; I don't feel any differnt; still have walking diffculty, extremely tired"    HISTORY OF PRESENT ILLNESS:   UPDATE (07/19/19, VRP): Since last visit, doing poorly (more fatigue, muscle weakness in arms). Had phone call with [facility] rheumatology but no other follow up. Prior joint pain and leg issues are improved. Double vision and swallowing issues are resolved. Tolerating pyridostigmine. Prednisone is completed.  UPDATE (02/24/19, VRP): Since last visit, had started on imuran 1 month ago. Then had fevers, pyuria, and weakness. Also had significant knee pain, and admitted to hospital (March 2020). Dx'd with rheumatoid arthritis and UTI; imuran was stopped.  No focal or localized signs to suggest myasthenia gravis sedation or crisis.  Prednisone 10 mg was started and patient was referred to rheumatology outpatient.  Of note patient was tested for flu and nCoV both which were negative.  Patient now back home.  He is doing well.  No fevers.  He is completing his antibiotic course.  He is on prednisone 10 mg daily.  Pain is subsided. MG symptoms are stable since last visit. Breathing, chewing, swallowing stable.   UPDATE (01/18/19, VRP): Since last visit, doing slightly worse. Mild weakness in left leg. Slightly mild generalized fatigue and weakness. Worse with exertion. Symptoms are worse than 2019. No speech, swallow or breathing issues. Tolerating mestinon.   UPDATE (04/15/18, VRP): Since last visit, doing about the same with MG. Tolerating mestinon. No alleviating or aggravating factors. Now planning to move to [place] for work.  UPDATE 07/16/17: Since last visit,  overall stable. Mild generalized fatigue continues. Overall stable. Notices some more muscle strain.   UPDATE 02/12/17: Since last visit, BP is better (meds have been adjusted). Now has CPAP machine (about to start). Sugars are better now (averaging ~150's). Intermittent double vision continues. Mild generalized fatigue, but no focal weakness.   UPDATE 12/31/16: Since last visit, IVIG completed, and general energy little better, but muscle cramps worse. BP running high. He has not checked sugars lately. PSG showed mod-severe OSA, and CPAP titration study is pending. Tolerating mestinon 60mg  BID.   UPDATE 10/09/16: Since last visit, double vision is slightly better on mestinon 60mg  TID. However having more muscle cramps, excess saliva production. Fatigable weakness is persistent.   PRIOR HPI (08/28/16): 69 year old ambidextrous male with hypertension, diabetes, hypercholesterolemia here for evaluation of myasthenia gravis. For past 1-2 years patient has had mild intermittent right-sided ptosis. In May 2017 he noticed onset of double vision, blurred vision, wavy vision. This is progressively worsened. For past 1 month he has had increasing fatigue with walking. Is having difficulty with upper and lower extremity strength. He denies any speech, swallowing, breathing difficulty. His balance has been slightly off. Patient went to PCP, ophthalmology, had [facility] antibody testing which confirmed diagnosis of myasthenia gravis.   REVIEW OF SYSTEMS: Full 14 system review of systems performed and negative with exception of: as per HPI.   ALLERGIES: Allergies  Allergen Reactions  . Lisinopril Swelling    Facial swelling    HOME MEDICATIONS: Outpatient Medications Prior to Visit  Medication Sig Dispense Refill  . amLODipine (NORVASC) 5 MG tablet TAKE 1 & 1/2 (ONE &  ONE-HALF) TABLETS BY MOUTH ONCE DAILY 90 tablet 0  . atorvastatin (LIPITOR) 20 MG tablet Take 1 tablet (20 mg total) by mouth daily. 90 tablet 1    . folic acid (FOLVITE) 1 MG tablet     . glipiZIDE (GLUCOTROL) 5 MG tablet Take 0.5 tablets (2.5 mg total) by mouth daily before breakfast. 30 tablet 1  . hydrochlorothiazide (HYDRODIURIL) 25 MG tablet Take 1 tablet (25 mg total) by mouth daily. 90 tablet 1  . losartan (COZAAR) 100 MG tablet Take 1 tablet (100 mg total) by mouth daily. 90 tablet 1  . metFORMIN (GLUCOPHAGE) 1000 MG tablet Take 1 tablet (1,000 mg total) by mouth 2 (two) times daily with a meal. 180 tablet 1  . Methotrexate 2.5 MG/ML SOLN     . predniSONE (DELTASONE) 10 MG tablet Take 2 tablets (20 mg total) by mouth daily with breakfast. 60 tablet 12  . pyridostigmine (MESTINON) 60 MG tablet Take 1 tablet (60 mg total) by mouth 3 (three) times daily. 180 tablet 4   No facility-administered medications prior to visit.    PAST MEDICAL HISTORY: Past Medical History:  Diagnosis Date  . Arthritis   . Diabetes mellitus without complication ([place])   . Hypercholesterolemia   . Hypertension   . Myasthenia gravis ([place])   . Rheumatoid arthritis ([place])     PAST SURGICAL HISTORY: Past Surgical History:  Procedure Laterality Date  . CATARACT EXTRACTION, BILATERAL  2020  . KNEE SURGERY Right    fractured patella > 20 yrs ago  . PENILE PROSTHESIS IMPLANT  2015    FAMILY HISTORY: Family History  Problem Relation Age of Onset  . Diabetes Mother   . Heart disease Mother   . Hyperlipidemia Mother   . Heart attack Mother   . Hyperlipidemia Father   . Kidney failure Father   . Drug abuse Maternal Grandmother   . Heart disease Maternal Grandmother   . Hypertension Maternal Grandmother   . Hyperlipidemia Maternal Grandmother   . Diabetes Maternal Grandmother     SOCIAL HISTORY:  Social History   Socioeconomic History  . Marital status: Single    Spouse name: Not on file  . Number of children: 1  . Years of education: 110  . Highest education level: Not on file  Occupational History    Comment: [employer]  Tobacco Use  .  Smoking status: Current Some Day Smoker    Packs/day: 1.00    Types: Cigars  . Smokeless tobacco: Never Used  Substance and Sexual Activity  . Alcohol use: Yes    Comment: occas  . Drug use: No  . Sexual activity: Not on file  Other Topics Concern  . Not on file  Social History Narrative   Drinks 2-3 caffeine drinks a day    Social Determinants of Health   Financial Resource Strain:   . Difficulty of Paying Living Expenses: Not on file  Food Insecurity:   . Worried About Charity fundraiser in the Last Year: Not on file  . Ran Out of Food in the Last Year: Not on file  Transportation Needs:   . Lack of Transportation (Medical): Not on file  . Lack of Transportation (Non-Medical): Not on file  Physical Activity:   . Days of Exercise per Week: Not on file  . Minutes of Exercise per Session: Not on file  Stress:   . Feeling of Stress : Not on file  Social Connections:   . Frequency of Communication with  Friends and Family: Not on file  . Frequency of Social Gatherings with Friends and Family: Not on file  . Attends Religious Services: Not on file  . Active Member of Clubs or Organizations: Not on file  . Attends Banker Meetings: Not on file  . Marital Status: Not on file  Intimate Partner Violence:   . Fear of Current or Ex-Partner: Not on file  . Emotionally Abused: Not on file  . Physically Abused: Not on file  . Sexually Abused: Not on file     PHYSICAL EXAM  GENERAL EXAM/CONSTITUTIONAL: Vitals:  Vitals:   01/18/20 0834  BP: (!) 130/58  Pulse: 72  Temp: (!) 96.5 F (35.8 C)  Weight: 205 lb (93 kg)  Height: 5\' 9"  (1.753 m)    Wt Readings from Last 3 Encounters:  01/18/20 205 lb (93 kg)  12/30/19 201 lb (91.2 kg)  10/18/19 201 lb (91.2 kg)   Body mass index is 30.27 kg/m. No exam data present  Patient is in no distress; well developed, nourished and groomed; neck is supple  NEUROLOGIC: MENTAL STATUS:  No flowsheet data  found.  awake, alert, oriented to person, place and time  recent and remote memory intact  normal attention and concentration  language fluent, comprehension intact, naming intact,   fund of knowledge appropriate  CRANIAL NERVE:   2nd, 3rd, 4th, 6th - pupils equal and reactive to light, visual fields full to confrontation, extraocular muscles --> NO DOUBLE VISION CURRENTLY; no nystagmus; MILD INTERMITTENT RIGHT PTOSIS  5th - facial sensation symmetric  7th - facial strength symmetric  8th - hearing intact  9th - palate elevates symmetrically, uvula midline  11th - shoulder shrug symmetric  12th - tongue protrusion midline  NO DYSARTHRIA  MOTOR:   full strength in BUE / BLE  COORDINATION:   fine finger movements normal  GAIT / STATION  SHORT STEPS; CAUTIOUS GAIT     DIAGNOSTIC DATA (LABS, IMAGING, TESTING) - I reviewed patient records, labs, notes, testing and imaging myself where available.  Lab Results  Component Value Date   WBC 8.0 02/21/2019   HGB 10.0 (L) 02/21/2019   HCT 28.8 (L) 02/21/2019   MCV 93.8 02/21/2019   PLT 191 02/21/2019      Component Value Date/Time   NA 142 07/22/2019 1517   K 4.6 07/22/2019 1517   CL 102 07/22/2019 1517   CO2 21 07/22/2019 1517   GLUCOSE 136 (H) 07/22/2019 1517   GLUCOSE 113 (H) 02/21/2019 0244   BUN 19 07/22/2019 1517   CREATININE 1.56 (H) 07/22/2019 1517   CREATININE 1.39 (H) 06/29/2016 1626   CALCIUM 9.2 07/22/2019 1517   PROT 7.6 07/22/2019 1517   ALBUMIN 4.7 07/22/2019 1517   AST 15 07/22/2019 1517   ALT 15 07/22/2019 1517   ALKPHOS 79 07/22/2019 1517   BILITOT 0.3 07/22/2019 1517   GFRNONAA 45 (L) 07/22/2019 1517   GFRNONAA 53 (L) 06/29/2016 1626   GFRAA 52 (L) 07/22/2019 1517   GFRAA 61 06/29/2016 1626   Lab Results  Component Value Date   CHOL 134 07/22/2019   HDL 38 (L) 07/22/2019   LDLCALC 72 07/22/2019   TRIG 122 07/22/2019   CHOLHDL 3.5 07/22/2019   Hemoglobin A1C  Date Value Ref  Range Status  10/18/2019 7.0 (A) 4.0 - 5.6 % Final   Hgb A1c MFr Bld  Date Value Ref Range Status  07/22/2019 7.2 (H) 4.8 - 5.6 % Final    Comment:  Prediabetes: 5.7 - 6.4          Diabetes: >6.4          Glycemic control for adults with diabetes: <7.0   02/20/2019 8.4 (H) 4.8 - 5.6 % Final    Comment:    (NOTE) Pre diabetes:          5.7%-6.4% Diabetes:              >6.4% Glycemic control for   <7.0% adults with diabetes   02/19/2019 8.5 (H) 4.8 - 5.6 % Final    Comment:             Prediabetes: 5.7 - 6.4          Diabetes: >6.4          Glycemic control for adults with diabetes: <7.0     No results found for: VITAMINB12 Lab Results  Component Value Date   TSH 3.650 10/24/2018    07/04/16 CT head [I reviewed images myself and agree with interpretation. -VRP]  - Mild microvascular disease without acute intracranial process.  09/04/16 CT chest  1. No mediastinal mass or adenopathy. 2. No infiltrate or pulmonary edema. 3. Degenerative changes thoracic spine. 4. Atherosclerotic calcifications of thoracic aorta. 5. Mild fatty infiltration of the liver.  12/31/16 LABS TPMT Activity: Units/mL RBC 31.8   Comment: Reference Range:  Normal: 15.1 - 26.4  Heterozygous for low TPMT variant: 6.3 - 15.0  Homozygous for low TPMT variant: <6.3        ASSESSMENT AND PLAN  69 y.o. year old male here with new onset diagnosis of myasthenia gravis with ocular and generalized features. Was having side effects from mestinon, but also some improvement in ocular symptoms. IVIG has helped generalized symptoms.    Dx: (ocular + generalized) myasthenia gravis  1. Myasthenia gravis (HCC)      PLAN:  MYASTHENIA GRAVIS - continue pyridostigmine 60mg  3x per day - slightly reduce prednisone to 15mg  daily; continue close BP and DM monitoring and control - would not restart imuran at this time (due to infx within 1 month of starting) - caution with gait and balance;  consider cane  JOINT PAIN (positive CCP) - per rheumatology; continue methotrexate  DIABETES / HYPERTENSION - continue BP and DM control  SLEEP APNEA - continue CPAP usage  Meds ordered this encounter  Medications  . predniSONE (DELTASONE) 5 MG tablet    Sig: Take 3 tablets (15 mg total) by mouth daily with breakfast.    Dispense:  90 tablet    Refill:  12   Return in about 6 months (around 07/17/2020).   , MD 01/18/2020, 9:13 AM Certified in Neurology, Neurophysiology and Neuroimaging  [facility] [address], [address] [place], [address] [place] [phone]

## 2020-01-19 ENCOUNTER — Encounter: Payer: Self-pay | Admitting: Family Medicine

## 2020-01-19 ENCOUNTER — Ambulatory Visit (INDEPENDENT_AMBULATORY_CARE_PROVIDER_SITE_OTHER): Payer: Medicare PPO | Admitting: Family Medicine

## 2020-01-19 ENCOUNTER — Other Ambulatory Visit: Payer: Self-pay

## 2020-01-19 VITALS — BP 135/63 | HR 66 | Temp 97.8°F | Ht 69.0 in | Wt 207.0 lb

## 2020-01-19 DIAGNOSIS — N181 Chronic kidney disease, stage 1: Secondary | ICD-10-CM | POA: Diagnosis not present

## 2020-01-19 DIAGNOSIS — E1122 Type 2 diabetes mellitus with diabetic chronic kidney disease: Secondary | ICD-10-CM | POA: Diagnosis not present

## 2020-01-19 DIAGNOSIS — M069 Rheumatoid arthritis, unspecified: Secondary | ICD-10-CM

## 2020-01-19 DIAGNOSIS — I10 Essential (primary) hypertension: Secondary | ICD-10-CM | POA: Diagnosis not present

## 2020-01-19 DIAGNOSIS — E785 Hyperlipidemia, unspecified: Secondary | ICD-10-CM | POA: Diagnosis not present

## 2020-01-19 MED ORDER — METFORMIN HCL 1000 MG PO TABS: 1000.0000 mg | ORAL_TABLET | Freq: Two times a day (BID) | ORAL | 1 refills | Status: DC

## 2020-01-19 MED ORDER — AMLODIPINE BESYLATE 5 MG PO TABS: ORAL_TABLET | ORAL | 1 refills | Status: DC

## 2020-01-19 MED ORDER — HYDROCHLOROTHIAZIDE 25 MG PO TABS: 25.0000 mg | ORAL_TABLET | Freq: Every day | ORAL | 1 refills | Status: DC

## 2020-01-19 MED ORDER — LOSARTAN POTASSIUM 100 MG PO TABS: 100.0000 mg | ORAL_TABLET | Freq: Every day | ORAL | 1 refills | Status: DC

## 2020-01-19 MED ORDER — ATORVASTATIN CALCIUM 20 MG PO TABS: 20.0000 mg | ORAL_TABLET | Freq: Every day | ORAL | 1 refills | Status: DC

## 2020-01-19 MED ORDER — GLIPIZIDE 5 MG PO TABS: 2.5000 mg | ORAL_TABLET | Freq: Every day | ORAL | 1 refills | Status: DC

## 2020-01-19 NOTE — Progress Notes (Signed)
Subjective:  Patient ID: Jonathan Mcgee, male    DOB: 01/16/1951  Age: 69 y.o. MRN: [id number]  CC:  Chief Complaint  Patient presents with  . Follow-up    of Diabetes. pt startes that after resent med change his BC reading have been alot better. pt check his BS 2 times daily. pt reports feeling a little tiered, but other than that feels good.  . fingers locking up    pt states his fingerson both hands will lock up and pt wont be able to move them unless he physicaly moves them with other hand.. pt states this has been going on for some time now. pt reports no pain when this happens just some tension in pt's arms    HPI Leyton Magoon presents for     Diabetes: With chronic kidney disease, thought to be due to HTN as well.  Last visit with nephrology over a year ago.  Complicated by prednisone use 20mg  daily, with history of myasthenia gravis.  Followed by neurology, Dr.Penumalli. appt yesterday. Decreased to 15mg .  Currently taking Metformin 1000 mg twice daily, glipizide 2.5 mg daily.  He is on statin with atorvastatin 20 mg daily, ARB with losartan 100 mg daily. Home readings: 90 day avg 129.  Fasting: 83, 100, 105, 111. No symptomatic lows other than 83 -improved with snack.  2hr postprandial: low 100's.  Has been watching diet.  New puppy at home.   Microalbumin: Normal ratio 07/22/2019 Optho, foot exam, pneumovax: Due for pneumonia vaccine, otherwise up-to-date.  Lab Results  Component Value Date   HGBA1C 7.0 (A) 10/18/2019   HGBA1C 7.2 (H) 07/22/2019   HGBA1C 8.4 (H) 02/20/2019   Lab Results  Component Value Date   LDLCALC 72 07/22/2019   CREATININE 1.56 (H) 07/22/2019   Hypertension: norvasc 7.5mg , hctz 25mg , losartan 100mg  all QD.  No new side effects. Rarely feels slight off balance if walking for awhile. Has not discussed with neuro.  Home readings: no recent readings - had been controlled. 130's usually.  BP Readings from Last 3 Encounters:  01/19/20 135/63    01/18/20 (!) 130/58  12/30/19 128/78   Lab Results  Component Value Date   CREATININE 1.56 (H) 07/22/2019   Hyperlipidemia: lipitor 20 mg qd. Some increased walking. No new side effects.   Lab Results  Component Value Date   CHOL 134 07/22/2019   HDL 38 (L) 07/22/2019   LDLCALC 72 07/22/2019   TRIG 122 07/22/2019   CHOLHDL 3.5 07/22/2019   Lab Results  Component Value Date   ALT 15 07/22/2019   AST 15 07/22/2019   ALKPHOS 79 07/22/2019   BILITOT 0.3 07/22/2019     Rheumatoid arthritis: Followed by Dr. Trudie Reed, last appointment 11/30/2019 reviewed.  Takes methotrexate 2.5 mg, 6 tabs once per week.  Some residual swelling noted at that time, was on 20 mg of prednisone per day at that time.  Was increased to 6 tablets weekly of methotrexate at that time, continued folic acid daily.  Plan for repeat labs in 4 weeks.   Has noticed fingers locking on both hands intermittently - thumb, middle, and index finger. Stiffness at times.discussed with neuro - not sure if MG related. Tension in arms at times, then feels like thumb moves in, or fingers, contract/twist and he has to move them.    History Patient Active Problem List   Diagnosis Date Noted  . Rheumatoid arthritis ([place]) 09/23/2019  . Morbid obesity ([place]) 07/19/2019  . UTI (urinary  tract infection) 02/20/2019  . Suspected COVID-19 virus infection 02/20/2019  . Type 2 diabetes mellitus (HCC) 02/20/2019  . Sepsis (HCC) 02/19/2019  . CKD (chronic kidney disease) 04/12/2017  . Essential hypertension 01/13/2017  . Hyperlipidemia 01/13/2017  . Myasthenia gravis (HCC) 08/28/2016   Past Medical History:  Diagnosis Date  . Arthritis   . Diabetes mellitus without complication (HCC)   . Hypercholesterolemia   . Hypertension   . Myasthenia gravis (HCC)   . Rheumatoid arthritis [facility])    Past Surgical History:  Procedure Laterality Date  . CATARACT EXTRACTION, BILATERAL  2020  . KNEE SURGERY Right    fractured patella > 20 yrs  ago  . PENILE PROSTHESIS IMPLANT  2015   Allergies  Allergen Reactions  . Lisinopril Swelling    Facial swelling   Prior to Admission medications   Medication Sig Start Date End Date Taking? Authorizing Provider  amLODipine (NORVASC) 5 MG tablet TAKE 1 & 1/2 (ONE & ONE-HALF) TABLETS BY MOUTH ONCE DAILY 11/24/19  Yes Shade Flood, MD  atorvastatin (LIPITOR) 20 MG tablet Take 1 tablet (20 mg total) by mouth daily. 07/19/19  Yes Shade Flood, MD  folic acid (FOLVITE) 1 MG tablet    Yes [provider]  glipiZIDE (GLUCOTROL) 5 MG tablet Take 0.5 tablets (2.5 mg total) by mouth daily before breakfast. 11/13/19  Yes Shade Flood, MD  hydrochlorothiazide (HYDRODIURIL) 25 MG tablet Take 1 tablet (25 mg total) by mouth daily. 07/19/19  Yes Shade Flood, MD  losartan (COZAAR) 100 MG tablet Take 1 tablet (100 mg total) by mouth daily. 07/19/19  Yes Shade Flood, MD  metFORMIN (GLUCOPHAGE) 1000 MG tablet Take 1 tablet (1,000 mg total) by mouth 2 (two) times daily with a meal. 07/19/19  Yes Shade Flood, MD  Methotrexate 2.5 MG/ML SOLN  09/24/19  Yes [provider]  predniSONE (DELTASONE) 5 MG tablet Take 3 tablets (15 mg total) by mouth daily with breakfast. 01/18/20  Yes Penumalli, Glenford Bayley, MD  pyridostigmine (MESTINON) 60 MG tablet Take 1 tablet (60 mg total) by mouth 3 (three) times daily. 07/19/19  Yes Penumalli, Glenford Bayley, MD   Social History   Socioeconomic History  . Marital status: Single    Spouse name: Not on file  . Number of children: 1  . Years of education: 65  . Highest education level: Not on file  Occupational History    Comment: [employer]  Tobacco Use  . Smoking status: Current Some Day Smoker    Packs/day: 1.00    Types: Cigars  . Smokeless tobacco: Never Used  Substance and Sexual Activity  . Alcohol use: Yes    Comment: occas  . Drug use: No  . Sexual activity: Not on file  Other Topics Concern  . Not on file  Social History  Narrative   Drinks 2-3 caffeine drinks a day    Social Determinants of Health   Financial Resource Strain:   . Difficulty of Paying Living Expenses: Not on file  Food Insecurity:   . Worried About Programme researcher, broadcasting/film/video in the Last Year: Not on file  . Ran Out of Food in the Last Year: Not on file  Transportation Needs:   . Lack of Transportation (Medical): Not on file  . Lack of Transportation (Non-Medical): Not on file  Physical Activity:   . Days of Exercise per Week: Not on file  . Minutes of Exercise per Session: Not on file  Stress:   . Feeling of Stress : Not on file  Social Connections:   . Frequency of Communication with Friends and Family: Not on file  . Frequency of Social Gatherings with Friends and Family: Not on file  . Attends Religious Services: Not on file  . Active Member of Clubs or Organizations: Not on file  . Attends Banker Meetings: Not on file  . Marital Status: Not on file  Intimate Partner Violence:   . Fear of Current or Ex-Partner: Not on file  . Emotionally Abused: Not on file  . Physically Abused: Not on file  . Sexually Abused: Not on file    Review of Systems  Constitutional: Negative for fatigue and unexpected weight change.  Eyes: Negative for visual disturbance.  Respiratory: Negative for cough, chest tightness and shortness of breath.   Cardiovascular: Negative for chest pain, palpitations and leg swelling.  Gastrointestinal: Negative for abdominal pain and blood in stool.  Neurological: Negative for dizziness, light-headedness and headaches.     Objective:   Vitals:   01/19/20 0834  BP: 135/63  Pulse: 66  Temp: 97.8 F (36.6 C)  TempSrc: Temporal  SpO2: 97%  Weight: 207 lb (93.9 kg)  Height: 5\' 9"  (1.753 m)     Physical Exam Vitals reviewed.  Constitutional:      Appearance: He is well-developed.  HENT:     Head: Normocephalic and atraumatic.  Eyes:     Pupils: Pupils are equal, round, and reactive to  light.  Neck:     Vascular: No carotid bruit or JVD.  Cardiovascular:     Rate and Rhythm: Normal rate and regular rhythm.     Heart sounds: Normal heart sounds. No murmur.  Pulmonary:     Effort: Pulmonary effort is normal.     Breath sounds: Normal breath sounds. No rales.  Musculoskeletal:     Comments: Appears to have overall intact range of motion at hands, fingers bilaterally.  No triggering.  Strength intact.  Skin:    General: Skin is warm and dry.  Neurological:     Mental Status: He is alert and oriented to person, place, and time.        Assessment & Plan:  Chris Cripps is a 69 y.o. male . Type 2 diabetes mellitus with chronic kidney disease, without long-term current use of insulin, unspecified CKD stage (HCC) - Plan: Hemoglobin A1c, metFORMIN (GLUCOPHAGE) 1000 MG tablet, CANCELED: POCT glucose (manual entry), CANCELED: POCT glycosylated hemoglobin (Hb A1C)  -Well-controlled on home readings, check A1c.  May need to discontinue glipizide to minimize risk of hypoglycemia, precautions given.  Essential hypertension - Plan: Comprehensive metabolic panel, amLODipine (NORVASC) 5 MG tablet, losartan (COZAAR) 100 MG tablet, hydrochlorothiazide (HYDRODIURIL) 25 MG tablet  -Stable, continue same regimen.  Hyperlipidemia, unspecified hyperlipidemia type - Plan: Comprehensive metabolic panel, Lipid panel, atorvastatin (LIPITOR) 20 MG tablet  -Tolerating Lipitor, continue same, labs pending  Rheumatoid arthritis, involving unspecified site, unspecified whether rheumatoid factor present (HCC)  -Continue follow-up with rheumatologist.  Hand symptoms do not sound like typical trigger finger, possibly related to myasthenia gravis or medication.  Recommend he discuss with neurology.  Type 2 diabetes mellitus with stage 1 chronic kidney disease, without long-term current use of insulin (HCC) - Plan: metFORMIN (GLUCOPHAGE) 1000 MG tablet As above  Meds ordered this encounter    Medications  . amLODipine (NORVASC) 5 MG tablet    Sig: TAKE 1 & 1/2 (ONE & ONE-HALF) TABLETS BY MOUTH ONCE  DAILY    Dispense:  135 tablet    Refill:  1  . metFORMIN (GLUCOPHAGE) 1000 MG tablet    Sig: Take 1 tablet (1,000 mg total) by mouth 2 (two) times daily with a meal.    Dispense:  180 tablet    Refill:  1  . losartan (COZAAR) 100 MG tablet    Sig: Take 1 tablet (100 mg total) by mouth daily.    Dispense:  90 tablet    Refill:  1  . glipiZIDE (GLUCOTROL) 5 MG tablet    Sig: Take 0.5 tablets (2.5 mg total) by mouth daily before breakfast.    Dispense:  90 tablet    Refill:  1  . atorvastatin (LIPITOR) 20 MG tablet    Sig: Take 1 tablet (20 mg total) by mouth daily.    Dispense:  90 tablet    Refill:  1  . hydrochlorothiazide (HYDRODIURIL) 25 MG tablet    Sig: Take 1 tablet (25 mg total) by mouth daily.    Dispense:  90 tablet    Refill:  1   Patient Instructions    I would discuss finger and thumb symptoms with Dr. Marjory Lies. It does not sound like trigger finger or rheumatologic cause at this point.   Home readings look great!  If any further low readings under 100, I would recommend stopping glipizide for now.  I will let you know your results of the A1c test in the next week or 2.  Recheck in 6 months.  Let me know if there are questions sooner and take care.    If you have lab work done today you will be contacted with your lab results within the next 2 weeks.  If you have not heard from [place] then please contact us. The fastest way to get your results is to register for My Chart.   IF you received an x-ray today, you will receive an invoice from [facility]. Please contact [facility] at [phone] with questions or concerns regarding your invoice.   IF you received labwork today, you will receive an invoice from [place]. Please contact [facility] at [phone] with questions or concerns regarding your invoice.   Our billing staff will not be  able to assist you with questions regarding bills from these companies.  You will be contacted with the lab results as soon as they are available. The fastest way to get your results is to activate your My Chart account. Instructions are located on the last page of this paperwork. If you have not heard from [place] regarding the results in 2 weeks, please contact this office.         Signed, [name], MD [facility]

## 2020-01-19 NOTE — Patient Instructions (Addendum)
  I would discuss finger and thumb symptoms with Dr. Marjory Lies. It does not sound like trigger finger or rheumatologic cause at this point.   Home readings look great!  If any further low readings under 100, I would recommend stopping glipizide for now.  I will let you know your results of the A1c test in the next week or 2.  Recheck in 6 months.  Let me know if there are questions sooner and take care.    If you have lab work done today you will be contacted with your lab results within the next 2 weeks.  If you have not heard from [place] then please contact us. The fastest way to get your results is to register for My Chart.   IF you received an x-ray today, you will receive an invoice from [facility]. Please contact [facility] at [phone] with questions or concerns regarding your invoice.   IF you received labwork today, you will receive an invoice from [place]. Please contact [facility] at [phone] with questions or concerns regarding your invoice.   Our billing staff will not be able to assist you with questions regarding bills from these companies.  You will be contacted with the lab results as soon as they are available. The fastest way to get your results is to activate your My Chart account. Instructions are located on the last page of this paperwork. If you have not heard from [place] regarding the results in 2 weeks, please contact this office.

## 2020-01-20 LAB — COMPREHENSIVE METABOLIC PANEL
ALT: 12 IU/L (ref 0–44)
AST: 14 IU/L (ref 0–40)
Albumin/Globulin Ratio: 1.8 (ref 1.2–2.2)
Albumin: 4.3 g/dL (ref 3.8–4.8)
Alkaline Phosphatase: 54 IU/L (ref 39–117)
BUN/Creatinine Ratio: 13 (ref 10–24)
BUN: 23 mg/dL (ref 8–27)
Bilirubin Total: 0.2 mg/dL (ref 0.0–1.2)
CO2: 18 mmol/L — ABNORMAL LOW (ref 20–29)
Calcium: 9 mg/dL (ref 8.6–10.2)
Chloride: 107 mmol/L — ABNORMAL HIGH (ref 96–106)
Creatinine, Ser: 1.71 mg/dL — ABNORMAL HIGH (ref 0.76–1.27)
GFR calc Af Amer: 46 mL/min/{1.73_m2} — ABNORMAL LOW (ref >59)
GFR calc non Af Amer: 40 mL/min/{1.73_m2} — ABNORMAL LOW (ref >59)
Globulin, Total: 2.4 g/dL (ref 1.5–4.5)
Glucose: 78 mg/dL (ref 65–99)
Potassium: 4 mmol/L (ref 3.5–5.2)
Sodium: 142 mmol/L (ref 134–144)
Total Protein: 6.7 g/dL (ref 6.0–8.5)

## 2020-01-20 LAB — LIPID PANEL
Chol/HDL Ratio: 2 ratio (ref 0.0–5.0)
Cholesterol, Total: 118 mg/dL (ref 100–199)
HDL: 59 mg/dL (ref >39)
LDL Chol Calc (NIH): 47 mg/dL (ref 0–99)
Triglycerides: 54 mg/dL (ref 0–149)
VLDL Cholesterol Cal: 12 mg/dL (ref 5–40)

## 2020-01-20 LAB — HEMOGLOBIN A1C
Est. average glucose Bld gHb Est-mCnc: 123 mg/dL
Hgb A1c MFr Bld: 5.9 % — ABNORMAL HIGH (ref 4.8–5.6)

## 2020-02-01 ENCOUNTER — Ambulatory Visit: Payer: Medicare PPO | Attending: Internal Medicine

## 2020-02-01 DIAGNOSIS — Z23 Encounter for immunization: Secondary | ICD-10-CM

## 2020-02-01 NOTE — Progress Notes (Signed)
   Covid-19 Vaccination Clinic  Name:  Jonathan Mcgee    MRN: [id number] DOB: Mar 28, 1951  02/01/2020  Mr. [name] was observed post Covid-19 immunization for 15 minutes without incident. He was provided with Vaccine Information Sheet and instruction to access the V-Safe system.   Mr. [name] was instructed to call 911 with any severe reactions post vaccine: Marland Kitchen Difficulty breathing  . Swelling of face and throat  . A fast heartbeat  . A bad rash all over body  . Dizziness and weakness   Immunizations Administered    Name Date Dose VIS Date Route   Pfizer COVID-19 Vaccine 02/01/2020  3:06 PM 0.3 mL 11/05/2019 Intramuscular   Manufacturer: [employer], [employer]   Lot: [id number]   NDC: [address]-5

## 2020-02-02 ENCOUNTER — Ambulatory Visit: Payer: Medicare PPO

## 2020-02-02 NOTE — Progress Notes (Signed)
CARDIOLOGY CONSULT NOTE       Patient ID: Selso Mannor MRN: [id number] DOB/AGE: 70-Oct-1952 70 y.o.  Admit date: (Not on file) Referring Physician: Neva Seat Primary Physician: Shade Flood, MD Primary Cardiologist: New Reason for Consultation: Murmur   Active Problems:   * No active hospital problems. *   HPI:  69 y.o. referred by Dr Neva Seat for murmur She has a history of DM, HTN, HLD, CRF, Myasthenia, RA on methotrexate She has significant muscle weakness in arms and fatigue from myasthenia Now off steroids Still smokes less than a ppd   She received COVID vaccine 02/01/20   Office visit 10/18/19 primary noted 1-2/6 SEM LLSB  No history of RF, radiation or fen phen use   He has lived in [place]. Has son in [place]. Does commercial contracting Activity limited by arthritis Recently methotrexate added and steroid dose Decreased 69 He describes having nuclear stress test years ago but it Was stopped due to HTN response and does not appear to be cath f/u  He has white coat HTN in addition to his baseline. BP after my interview and sitting for 20 minutes was 130/palp    Myesthenia has been well controlled Initially diagnosed with double vision   ROS All other systems reviewed and negative except as noted above  Past Medical History:  Diagnosis Date  . Arthritis   . Diabetes mellitus without complication (HCC)   . Hypercholesterolemia   . Hypertension   . Myasthenia gravis (HCC)   . Rheumatoid arthritis (HCC)     Family History  Problem Relation Age of Onset  . Diabetes Mother   . Heart disease Mother   . Hyperlipidemia Mother   . Heart attack Mother   . Hyperlipidemia Father   . Kidney failure Father   . Drug abuse Maternal Grandmother   . Heart disease Maternal Grandmother   . Hypertension Maternal Grandmother   . Hyperlipidemia Maternal Grandmother   . Diabetes Maternal Grandmother     Social History   Socioeconomic History  . Marital status: Single     Spouse name: Not on file  . Number of children: 1  . Years of education: 61  . Highest education level: Not on file  Occupational History    Comment: [employer]  Tobacco Use  . Smoking status: Current Some Day Smoker    Packs/day: 1.00    Types: Cigars  . Smokeless tobacco: Never Used  Substance and Sexual Activity  . Alcohol use: Yes    Comment: occas  . Drug use: No  . Sexual activity: Not on file  Other Topics Concern  . Not on file  Social History Narrative   Drinks 2-3 caffeine drinks a day    Social Determinants of Health   Financial Resource Strain:   . Difficulty of Paying Living Expenses: Not on file  Food Insecurity:   . Worried About Programme researcher, broadcasting/film/video in the Last Year: Not on file  . Ran Out of Food in the Last Year: Not on file  Transportation Needs:   . Lack of Transportation (Medical): Not on file  . Lack of Transportation (Non-Medical): Not on file  Physical Activity:   . Days of Exercise per Week: Not on file  . Minutes of Exercise per Session: Not on file  Stress:   . Feeling of Stress : Not on file  Social Connections:   . Frequency of Communication with Friends and Family: Not on file  . Frequency of Social Gatherings with Friends  and Family: Not on file  . Attends Religious Services: Not on file  . Active Member of Clubs or Organizations: Not on file  . Attends Archivist Meetings: Not on file  . Marital Status: Not on file  Intimate Partner Violence:   . Fear of Current or Ex-Partner: Not on file  . Emotionally Abused: Not on file  . Physically Abused: Not on file  . Sexually Abused: Not on file    Past Surgical History:  Procedure Laterality Date  . CATARACT EXTRACTION, BILATERAL  2020  . KNEE SURGERY Right    fractured patella > 20 yrs ago  . PENILE PROSTHESIS IMPLANT  2015      Current Outpatient Medications:  .  amLODipine (NORVASC) 5 MG tablet, TAKE 1 & 1/2 (ONE & ONE-HALF) TABLETS BY MOUTH ONCE DAILY, Disp: 135 tablet, Rfl:  1 .  atorvastatin (LIPITOR) 20 MG tablet, Take 1 tablet (20 mg total) by mouth daily., Disp: 90 tablet, Rfl: 1 .  folic acid (FOLVITE) 1 MG tablet, , Disp: , Rfl:  .  glipiZIDE (GLUCOTROL) 5 MG tablet, Take 0.5 tablets (2.5 mg total) by mouth daily before breakfast., Disp: 90 tablet, Rfl: 1 .  hydrochlorothiazide (HYDRODIURIL) 25 MG tablet, Take 1 tablet (25 mg total) by mouth daily., Disp: 90 tablet, Rfl: 1 .  losartan (COZAAR) 100 MG tablet, Take 1 tablet (100 mg total) by mouth daily., Disp: 90 tablet, Rfl: 1 .  metFORMIN (GLUCOPHAGE) 1000 MG tablet, Take 1 tablet (1,000 mg total) by mouth 2 (two) times daily with a meal., Disp: 180 tablet, Rfl: 1 .  Methotrexate 2.5 MG/ML SOLN, , Disp: , Rfl:  .  predniSONE (DELTASONE) 5 MG tablet, Take 3 tablets (15 mg total) by mouth daily with breakfast., Disp: 90 tablet, Rfl: 12 .  pyridostigmine (MESTINON) 60 MG tablet, Take 1 tablet (60 mg total) by mouth 3 (three) times daily., Disp: 180 tablet, Rfl: 4    Physical Exam: There were no vitals taken for this visit.    Affect appropriate Chronically ill male  HEENT: normal Neck supple with no adenopathy JVP normal no bruits no thyromegaly Lungs clear with no wheezing and good diaphragmatic motion Heart:  S1/S2 2/6 SEM  murmur, no rub, gallop or click PMI normal Abdomen: benighn, BS positve, no tenderness, no AAA no bruit.  No HSM or HJR Distal pulses intact with no bruits No edema Neuro non-focal Skin warm and dry No muscular weakness  Labs:   Lab Results  Component Value Date   WBC 8.0 02/21/2019   HGB 10.0 (L) 02/21/2019   HCT 28.8 (L) 02/21/2019   MCV 93.8 02/21/2019   PLT 191 02/21/2019   No results for input(s): NA, K, CL, CO2, BUN, CREATININE, CALCIUM, PROT, BILITOT, ALKPHOS, ALT, AST, GLUCOSE in the last 168 hours.  Invalid input(s): LABALBU Lab Results  Component Value Date   CKTOTAL 107 07/22/2019    Lab Results  Component Value Date   CHOL 118 01/19/2020   CHOL  134 07/22/2019   CHOL 134 02/19/2019   Lab Results  Component Value Date   HDL 59 01/19/2020   HDL 38 (L) 07/22/2019   HDL 41 02/19/2019   Lab Results  Component Value Date   LDLCALC 47 01/19/2020   LDLCALC 72 07/22/2019   LDLCALC 67 02/19/2019   Lab Results  Component Value Date   TRIG 54 01/19/2020   TRIG 122 07/22/2019   TRIG 128 02/19/2019   Lab Results  Component  Value Date   CHOLHDL 2.0 01/19/2020   CHOLHDL 3.5 07/22/2019   CHOLHDL 3.3 02/19/2019   No results found for: LDLDIRECT    Radiology: No results found.  EKG: SR rate 90 PVC otherwise normal     ASSESSMENT AND PLAN:   1. 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 HTN:  Well controlled.  Continue current medications and low sodium Dash type diet.    3. DM:  Discussed low carb diet.  Target hemoglobin A1c is 6.5 or less.  Continue current medications.  4. Myesthenia:  F/u neurology on mestinon and low dose steroids  5. RA:  Arthritis stable on methotrexate   6. CRF:  Cr 1.71 likely due to HTN/DM f/u primary   7. CAD:  Risk with poorly controlled DM, cigar smoking , HTN will order lexiscan myovue since he has not had stress test in years and activity limited by arthritis  Signed: Charlton Haws 02/02/2020, 11:56 AM

## 2020-02-08 DIAGNOSIS — M4810 Ankylosing hyperostosis [Forestier], site unspecified: Secondary | ICD-10-CM | POA: Diagnosis not present

## 2020-02-08 DIAGNOSIS — M2550 Pain in unspecified joint: Secondary | ICD-10-CM | POA: Diagnosis not present

## 2020-02-08 DIAGNOSIS — M0579 Rheumatoid arthritis with rheumatoid factor of multiple sites without organ or systems involvement: Secondary | ICD-10-CM | POA: Diagnosis not present

## 2020-02-08 DIAGNOSIS — G7000 Myasthenia gravis without (acute) exacerbation: Secondary | ICD-10-CM | POA: Diagnosis not present

## 2020-02-08 DIAGNOSIS — Z6829 Body mass index (BMI) 29.0-29.9, adult: Secondary | ICD-10-CM | POA: Diagnosis not present

## 2020-02-08 DIAGNOSIS — E663 Overweight: Secondary | ICD-10-CM | POA: Diagnosis not present

## 2020-02-08 DIAGNOSIS — M150 Primary generalized (osteo)arthritis: Secondary | ICD-10-CM | POA: Diagnosis not present

## 2020-02-15 ENCOUNTER — Ambulatory Visit: Payer: Medicare PPO | Admitting: Cardiovascular Disease

## 2020-02-15 ENCOUNTER — Other Ambulatory Visit: Payer: Self-pay

## 2020-02-15 ENCOUNTER — Encounter: Payer: Self-pay | Admitting: Cardiovascular Disease

## 2020-02-15 VITALS — HR 90 | Ht 69.0 in | Wt 207.0 lb

## 2020-02-15 DIAGNOSIS — R9431 Abnormal electrocardiogram [ECG] [EKG]: Secondary | ICD-10-CM | POA: Diagnosis not present

## 2020-02-15 DIAGNOSIS — R079 Chest pain, unspecified: Secondary | ICD-10-CM | POA: Diagnosis not present

## 2020-02-15 DIAGNOSIS — R011 Cardiac murmur, unspecified: Secondary | ICD-10-CM | POA: Diagnosis not present

## 2020-02-15 NOTE — Patient Instructions (Addendum)
Medication Instructions:  *If you need a refill on your cardiac medications before your next appointment, please call your pharmacy*  Lab Work: If you have labs (blood work) drawn today and your tests are completely normal, you will receive your results only by: Marland Kitchen MyChart Message (if you have MyChart) OR . A paper copy in the mail If you have any lab test that is abnormal or we need to change your treatment, we will call you to review the results.  Testing/Procedures: Your physician has requested that you have an echocardiogram. Echocardiography is a painless test that uses sound waves to create images of your heart. It provides your doctor with information about the size and shape of your heart and how well your heart's chambers and valves are working. This procedure takes approximately one hour. There are no restrictions for this procedure.  Your physician has requested that you have a lexiscan myoview. For further information please visit [email or web address] Please follow instruction sheet, as given.  Follow-Up: At [facility], you and your health needs are our priority.  As part of our continuing mission to provide you with exceptional heart care, we have created designated Provider Care Teams.  These Care Teams include your primary Cardiologist (physician) and Advanced Practice Providers (APPs -  Physician Assistants and Nurse Practitioners) who all work together to provide you with the care you need, when you need it.  We recommend signing up for the patient portal called "MyChart".  Sign up information is provided on this After Visit Summary.  MyChart is used to connect with patients for Virtual Visits (Telemedicine).  Patients are able to view lab/test results, encounter notes, upcoming appointments, etc.  Non-urgent messages can be sent to your provider as well.   To learn more about what you can do with MyChart, go to [email or web address].    Your next appointment:   12  month(s)  The format for your next appointment:   In Person  Provider:   You may see Dr. Eden Emms or one of the following Advanced Practice Providers on your designated Care Team:    Norma Fredrickson, NP  Nada Boozer, NP  Georgie Chard, NP

## 2020-02-24 ENCOUNTER — Telehealth (HOSPITAL_COMMUNITY): Payer: Self-pay

## 2020-02-24 NOTE — Telephone Encounter (Signed)
Detailed instructions left on the patient's answering machine. Asked if he could call back with any questions. S.Ravyn Nikkel EMTP

## 2020-02-29 ENCOUNTER — Telehealth: Payer: Self-pay

## 2020-02-29 ENCOUNTER — Other Ambulatory Visit: Payer: Self-pay

## 2020-02-29 ENCOUNTER — Ambulatory Visit (HOSPITAL_BASED_OUTPATIENT_CLINIC_OR_DEPARTMENT_OTHER): Payer: Medicare PPO

## 2020-02-29 ENCOUNTER — Ambulatory Visit (HOSPITAL_COMMUNITY): Payer: Medicare PPO | Attending: Cardiovascular Disease

## 2020-02-29 DIAGNOSIS — R011 Cardiac murmur, unspecified: Secondary | ICD-10-CM

## 2020-02-29 DIAGNOSIS — R9431 Abnormal electrocardiogram [ECG] [EKG]: Secondary | ICD-10-CM | POA: Diagnosis not present

## 2020-02-29 DIAGNOSIS — R079 Chest pain, unspecified: Secondary | ICD-10-CM | POA: Insufficient documentation

## 2020-02-29 LAB — MYOCARDIAL PERFUSION IMAGING
LV dias vol: 135 mL (ref 62–150)
LV sys vol: 55 mL
Peak HR: 76 {beats}/min
Rest HR: 61 {beats}/min
SDS: 1
SRS: 0
SSS: 1
TID: 0.98

## 2020-02-29 LAB — ECHOCARDIOGRAM COMPLETE
Height: 69 in
Weight: 3312 oz

## 2020-02-29 MED ORDER — TECHNETIUM TC 99M TETROFOSMIN IV KIT
10.5000 | PACK | Freq: Once | INTRAVENOUS | Status: AC | PRN
  Administered 2020-02-29: 10.5 via INTRAVENOUS
  Filled 2020-02-29: qty 11

## 2020-02-29 MED ORDER — TECHNETIUM TC 99M TETROFOSMIN IV KIT
31.4000 | PACK | Freq: Once | INTRAVENOUS | Status: AC | PRN
  Administered 2020-02-29: 31.4 via INTRAVENOUS
  Filled 2020-02-29: qty 32

## 2020-02-29 MED ORDER — REGADENOSON 0.4 MG/5ML IV SOLN
0.4000 mg | Freq: Once | INTRAVENOUS | Status: AC
  Administered 2020-02-29: 0.4 mg via INTRAVENOUS

## 2020-02-29 NOTE — Telephone Encounter (Signed)
-----   Message from Wendall Stade, MD sent at 02/29/2020 12:01 PM EDT ----- Trivial MR mild AR should not be clinically significant

## 2020-02-29 NOTE — Telephone Encounter (Signed)
Called pt., no answer. No voicemail available.  

## 2020-04-12 DIAGNOSIS — M0579 Rheumatoid arthritis with rheumatoid factor of multiple sites without organ or systems involvement: Secondary | ICD-10-CM | POA: Diagnosis not present

## 2020-04-12 DIAGNOSIS — Z6830 Body mass index (BMI) 30.0-30.9, adult: Secondary | ICD-10-CM | POA: Diagnosis not present

## 2020-04-12 DIAGNOSIS — M2550 Pain in unspecified joint: Secondary | ICD-10-CM | POA: Diagnosis not present

## 2020-04-12 DIAGNOSIS — Z79899 Other long term (current) drug therapy: Secondary | ICD-10-CM | POA: Diagnosis not present

## 2020-04-12 DIAGNOSIS — G7000 Myasthenia gravis without (acute) exacerbation: Secondary | ICD-10-CM | POA: Diagnosis not present

## 2020-04-12 DIAGNOSIS — M150 Primary generalized (osteo)arthritis: Secondary | ICD-10-CM | POA: Diagnosis not present

## 2020-04-12 DIAGNOSIS — M4810 Ankylosing hyperostosis [Forestier], site unspecified: Secondary | ICD-10-CM | POA: Diagnosis not present

## 2020-04-12 DIAGNOSIS — Z7952 Long term (current) use of systemic steroids: Secondary | ICD-10-CM | POA: Diagnosis not present

## 2020-04-12 DIAGNOSIS — E669 Obesity, unspecified: Secondary | ICD-10-CM | POA: Diagnosis not present

## 2020-04-18 ENCOUNTER — Other Ambulatory Visit: Payer: Self-pay | Admitting: Rheumatology

## 2020-04-18 DIAGNOSIS — Z7952 Long term (current) use of systemic steroids: Secondary | ICD-10-CM

## 2020-05-09 DIAGNOSIS — D649 Anemia, unspecified: Secondary | ICD-10-CM | POA: Diagnosis not present

## 2020-07-05 ENCOUNTER — Other Ambulatory Visit: Payer: Self-pay

## 2020-07-05 ENCOUNTER — Ambulatory Visit
Admission: RE | Admit: 2020-07-05 | Discharge: 2020-07-05 | Disposition: A | Discharge status: Home / Self Care | Payer: Medicare PPO | Source: Ambulatory Visit | Attending: Rheumatology | Admitting: Rheumatology

## 2020-07-05 ENCOUNTER — Other Ambulatory Visit: Payer: Medicare PPO

## 2020-07-05 DIAGNOSIS — Z7952 Long term (current) use of systemic steroids: Secondary | ICD-10-CM

## 2020-07-19 ENCOUNTER — Encounter: Payer: Self-pay | Admitting: Family Medicine

## 2020-07-19 ENCOUNTER — Ambulatory Visit: Payer: Medicare PPO | Admitting: Family Medicine

## 2020-07-19 ENCOUNTER — Other Ambulatory Visit: Payer: Self-pay

## 2020-07-19 VITALS — BP 138/66 | HR 86 | Temp 98.0°F | Ht 69.0 in | Wt 203.0 lb

## 2020-07-19 DIAGNOSIS — R0609 Other forms of dyspnea: Secondary | ICD-10-CM

## 2020-07-19 DIAGNOSIS — M791 Myalgia, unspecified site: Secondary | ICD-10-CM

## 2020-07-19 DIAGNOSIS — M2550 Pain in unspecified joint: Secondary | ICD-10-CM

## 2020-07-19 DIAGNOSIS — E785 Hyperlipidemia, unspecified: Secondary | ICD-10-CM | POA: Diagnosis not present

## 2020-07-19 DIAGNOSIS — M069 Rheumatoid arthritis, unspecified: Secondary | ICD-10-CM

## 2020-07-19 DIAGNOSIS — G7000 Myasthenia gravis without (acute) exacerbation: Secondary | ICD-10-CM

## 2020-07-19 DIAGNOSIS — E1122 Type 2 diabetes mellitus with diabetic chronic kidney disease: Secondary | ICD-10-CM | POA: Diagnosis not present

## 2020-07-19 DIAGNOSIS — M7989 Other specified soft tissue disorders: Secondary | ICD-10-CM

## 2020-07-19 DIAGNOSIS — R0600 Dyspnea, unspecified: Secondary | ICD-10-CM

## 2020-07-19 DIAGNOSIS — I10 Essential (primary) hypertension: Secondary | ICD-10-CM | POA: Diagnosis not present

## 2020-07-19 NOTE — Progress Notes (Signed)
Subjective:  Patient ID: Jonathan Mcgee, male    DOB: June 10, 1951  Age: 69 y.o. MRN: [id number]  CC:  Chief Complaint  Patient presents with  . Follow-up    on diabetes. pt reports no issues with this condition.  . pt discussion    Pt would like to have a talk with provider about unknown topic. pt states he has other issues that none of his doctors want to address. pt reports frustration with providers and states their seems to be no team work. Pt reports weak legs abd feeling off balance when he walks pt reports he is worried about falling.    HPI Jonathan Mcgee presents for   Frustrated that his quality of life is not improving, and isand concerned that he has discussed these symptoms with his other providers and does not feel like getting to the bottom of the problem. Feels like some issues are getting worse. Question of meds causing issues before,   Diabetes: With chronic kidney disease, also thought to be component of hypertension with CKD.  Has been seen by nephrology previously.  Also been complicated by use of prednisone for myasthenia gravis, neurologist Dr. Marjory Lies.  Dosage has been decreased to 15 mg at his visit in February.  He was taking 1000 mg Metformin twice daily, glipizide 2.5 mg daily at that time and was on ARB and statin. At last visit recommended to stop glipizide if any further home readings under 100, rate controlled with A1c 5.9 in February.  No recent home readings.  Off glipizide. Still some diarhea with metformin at times - not daily, but most days than not, symptoms for years, not changed recently. Other meds cost prohibitive in past.   Microalbumin: Normal microalbumin 07/22/2019.  Optho, foot exam, pneumovax: Due for Pneumovax and foot exam, declined today.  Ophthalmology 09/07/2019.   Lab Results  Component Value Date   HGBA1C 5.9 (H) 01/19/2020   HGBA1C 7.0 (A) 10/18/2019   HGBA1C 7.2 (H) 07/22/2019   Lab Results  Component Value Date   LDLCALC 47  01/19/2020   CREATININE 1.71 (H) 01/19/2020   Hypertension: Norvasc 7.5 mg, HCTZ 25 mg, losartan 100 mg daily. Home readings: BP Readings from Last 3 Encounters:  07/19/20 138/66  01/19/20 135/63  01/18/20 (!) 130/58   Lab Results  Component Value Date   CREATININE 1.71 (H) 01/19/2020    Hyperlipidemia: Lipitor 20 mg daily. Some soreness in joints, cramps in hands, lower extremities at times. Achy all around at times.  Has discussed with rheumatology and neuro.   Lab Results  Component Value Date   CHOL 118 01/19/2020   HDL 59 01/19/2020   LDLCALC 47 01/19/2020   TRIG 54 01/19/2020   CHOLHDL 2.0 01/19/2020   Lab Results  Component Value Date   ALT 12 01/19/2020   AST 14 01/19/2020   ALKPHOS 54 01/19/2020   BILITOT 0.2 01/19/2020   Rheumatoid arthritis Rheumatologist Dr. Nickola Major.  Methotrexate 6 tabs weekly and folic acid daily.  He did discuss some stiffness at times and locking of the joints at times in his fingers at last visit.  Unsure that time if it was related to his myasthenia gravis, but did initially recommend discussing this finger and some symptoms with Dr. Marjory Lies.  Did not sound like true trigger finger or rheumatologic cause at February visit. appt in May noted - continued on mtx 6 per week, but had to decrease dose to 3 or 4 per week due to blood counts  decreasing.  appt with Dr. Nickola Major tomorrow. Still pains in hands.  Myasthenia Gravis: Treated with Mestinon and Prednisone 15mg   appt with Dr. Marjory Lies on 8/30. Having some balance issues, legs trembling. Does not feel comfortable with prolonged standing. Pain in R leg/annoyance to R thigh/hip at times past few months. Guarded on that leg. No fall/injury. Has been using a cane - has a walker if needed at home. No bowel or bladder incontinence, no saddle anesthesia, no new lower extremity weakness. Shaking feeling.   Trouble walking long distances - short of breath with distances and sweating. Some pressure  in chest with walking distances.  Cardiology eval in March -trivial mitral regurgitation, mild aortic regurgitation that was not thought to be clinically significant.  Lexiscan Myoview February 29, 2020 nuclear stress EF 59%.  No ST segment deviation noted during stress, low risk study.  Normal study.   Swelling and burning in feet for past year.   Isolated with pandemic.  Denies depression. Anxious at times. Tired of same routine. Denies SI.  Has friend in [place] - few times per week.  Declines counseling at this time.   Depression screen [facility] 2/9 01/19/2020 12/30/2019 10/18/2019 08/20/2019 07/22/2019  Decreased Interest 0 0 0 0 0  Down, Depressed, Hopeless 0 0 0 0 0  PHQ - 2 Score 0 0 0 0 0     History Patient Active Problem List   Diagnosis Date Noted  . Rheumatoid arthritis (HCC) 09/23/2019  . Morbid obesity (HCC) 07/19/2019  . UTI (urinary tract infection) 02/20/2019  . Suspected COVID-19 virus infection 02/20/2019  . Type 2 diabetes mellitus (HCC) 02/20/2019  . Sepsis (HCC) 02/19/2019  . CKD (chronic kidney disease) 04/12/2017  . Essential hypertension 01/13/2017  . Hyperlipidemia 01/13/2017  . Myasthenia gravis (HCC) 08/28/2016   Past Medical History:  Diagnosis Date  . Arthritis   . Diabetes mellitus without complication (HCC)   . Hypercholesterolemia   . Hypertension   . Myasthenia gravis (HCC)   . Rheumatoid arthritis [facility])    Past Surgical History:  Procedure Laterality Date  . CATARACT EXTRACTION, BILATERAL  2020  . KNEE SURGERY Right    fractured patella > 20 yrs ago  . PENILE PROSTHESIS IMPLANT  2015   Allergies  Allergen Reactions  . Lisinopril Swelling    Facial swelling   Prior to Admission medications   Medication Sig Start Date End Date Taking? Authorizing Provider  amLODipine (NORVASC) 5 MG tablet TAKE 1 & 1/2 (ONE & ONE-HALF) TABLETS BY MOUTH ONCE DAILY 01/19/20  Yes Shade Flood, MD  atorvastatin (LIPITOR) 20 MG tablet Take 1 tablet (20 mg  total) by mouth daily. 01/19/20  Yes Shade Flood, MD  folic acid (FOLVITE) 1 MG tablet    Yes [provider]  glipiZIDE (GLUCOTROL) 5 MG tablet Take 0.5 tablets (2.5 mg total) by mouth daily before breakfast. 01/19/20  Yes Shade Flood, MD  hydrochlorothiazide (HYDRODIURIL) 25 MG tablet Take 1 tablet (25 mg total) by mouth daily. 01/19/20  Yes Shade Flood, MD  losartan (COZAAR) 100 MG tablet Take 1 tablet (100 mg total) by mouth daily. 01/19/20  Yes Shade Flood, MD  metFORMIN (GLUCOPHAGE) 1000 MG tablet Take 1 tablet (1,000 mg total) by mouth 2 (two) times daily with a meal. 01/19/20  Yes Shade Flood, MD  Methotrexate 2.5 MG/ML SOLN  09/24/19  Yes [provider]  predniSONE (DELTASONE) 5 MG tablet Take 3 tablets (15 mg total) by mouth  daily with breakfast. 01/18/20  Yes Penumalli, Glenford Bayley, MD  pyridostigmine (MESTINON) 60 MG tablet Take 1 tablet (60 mg total) by mouth 3 (three) times daily. 07/19/19  Yes Penumalli, Glenford Bayley, MD   Social History   Socioeconomic History  . Marital status: Single    Spouse name: Not on file  . Number of children: 1  . Years of education: 56  . Highest education level: Not on file  Occupational History    Comment: [employer]  Tobacco Use  . Smoking status: Current Some Day Smoker    Packs/day: 1.00    Types: Cigars  . Smokeless tobacco: Never Used  Substance and Sexual Activity  . Alcohol use: Yes    Comment: occas  . Drug use: No  . Sexual activity: Not on file  Other Topics Concern  . Not on file  Social History Narrative   Drinks 2-3 caffeine drinks a day    Social Determinants of Health   Financial Resource Strain:   . Difficulty of Paying Living Expenses: Not on file  Food Insecurity:   . Worried About Programme researcher, broadcasting/film/video in the Last Year: Not on file  . Ran Out of Food in the Last Year: Not on file  Transportation Needs:   . Lack of Transportation (Medical): Not on file  . Lack of Transportation  (Non-Medical): Not on file  Physical Activity:   . Days of Exercise per Week: Not on file  . Minutes of Exercise per Session: Not on file  Stress:   . Feeling of Stress : Not on file  Social Connections:   . Frequency of Communication with Friends and Family: Not on file  . Frequency of Social Gatherings with Friends and Family: Not on file  . Attends Religious Services: Not on file  . Active Member of Clubs or Organizations: Not on file  . Attends Banker Meetings: Not on file  . Marital Status: Not on file  Intimate Partner Violence:   . Fear of Current or Ex-Partner: Not on file  . Emotionally Abused: Not on file  . Physically Abused: Not on file  . Sexually Abused: Not on file    Review of Systems Per HPI;   Objective:   Vitals:   07/19/20 1607 07/19/20 1617  BP: (!) 163/72 138/66  Pulse: 86   Temp: 98 F (36.7 C)   TempSrc: Temporal   SpO2: 95%   Weight: 203 lb (92.1 kg)   Height: 5\' 9"  (1.753 m)      Physical Exam Vitals reviewed.  Constitutional:      Appearance: He is well-developed.  HENT:     Head: Normocephalic and atraumatic.  Eyes:     Pupils: Pupils are equal, round, and reactive to light.  Neck:     Vascular: No carotid bruit or JVD.  Cardiovascular:     Rate and Rhythm: Normal rate and regular rhythm.     Heart sounds: Normal heart sounds. No murmur heard.   Pulmonary:     Effort: Pulmonary effort is normal.     Breath sounds: Normal breath sounds. No rales.  Musculoskeletal:     Right lower leg: Edema (feet only. ) present.     Left lower leg: Edema (feet only. ) present.  Skin:    General: Skin is warm and dry.  Neurological:     Mental Status: He is alert and oriented to person, place, and time.  Psychiatric:  Attention and Perception: Attention normal.        Speech: Speech normal.        Behavior: Behavior normal.       58 minutes spent during visit, greater than 50% counseling and assimilation of  information, chart review, and discussion of plan, additional chart completion.    Assessment & Plan:  Jonathan Mcgee is a 69 y.o. male . Type 2 diabetes mellitus with chronic kidney disease, without long-term current use of insulin, unspecified CKD stage (HCC) - Plan: Hemoglobin A1c  -off glipizide. Option off lower dose or med change from metformin (depending on cost of alternatives) if persistent diarrhea and depending on A1c. Good control prior.   Essential hypertension - Plan: Comprehensive metabolic panel  -  Stable, tolerating current regimen.  Labs pending as above.   Hyperlipidemia, unspecified hyperlipidemia type - Plan: Comprehensive metabolic panel, Lipid panel Polyarthralgia Myalgia  - lipids well controlled previously. Osteoarthritis, DISH, RA, may be contributing to arthralgia/myalgia, but will try temporary cessation of statin for 2 weeks. If improved, then possible intermittent dosing option.   - keep follow up with rheumatology.    Bilateral swelling of feet Rheumatoid arthritis, involving unspecified site, unspecified whether rheumatoid factor present (HCC)  - follow up planned with rheumatology tomorrow. Foot swelling may be from prednisone, but reportedly had to decreae MTX dose to possible dyscrasia - will look for note. He plans to discuss options tomorrow and questions about his quality of life and tx options.  - could consider gabapentin for foot symptoms, but could worsen MG. He plans to d/w neurology at upcoming appt.   -plans to follow up for leg symptoms.   MG (myasthenia gravis) (HCC)  -on prednisone, follow up planned with neuro next week - he plans to discuss options as concern of his declining QOL, and balance issues. assistive device recommended.   DOE: Prior cardiac eval reassuring. May be related to MG, but consider follow up with cardiology and ER precautions given.   Denies depression, but may have some adjustment d/o component with chronic dz.  Option of counseling.   No orders of the defined types were placed in this encounter.  Patient Instructions   Continue metformin for now, depending on A1c could discuss lower dose or other options if continued diarrhea.   Try stopping the atorvastatin for next 2 weeks to see if the muscle cramps and aches improve. Let me know how that goes and we can discuss next step.   Discuss options for RA with Dr. Nickola Major tomorrow, especially if there is a thought of decreasing your prednisone. We will see if some of the cramps/pains may improve off Lipitor.   We can follow up on the R leg symptoms at next visit if nt addressed by rheumatology or neuro at upcoming appointments.   Discuss the balance and shakiness with Dr. Marjory Lies.   If more shortness of breath and chest pressure with exertion, I would recommend follow up with Dr. Eden Emms, but can also discuss this with Dr. Marjory Lies at upcoming visit.  Swelling/burning in feet could be related to the prednisone, but other causes of nerve issues in feet possible, including diabetes.  I will check your A1c, but would consider gabapentin if that ok to use with your myasthenia gravis.   Return to the clinic or go to the nearest emergency room if any of your symptoms worsen or new symptoms occur.  I do recommend social outlet of some type and continue to talk to  friend in [place]. I am happy to give you some therapists if you are interested. We can discuss this further next visit.    If you have lab work done today you will be contacted with your lab results within the next 2 weeks.  If you have not heard from [place] then please contact us. The fastest way to get your results is to register for My Chart.   IF you received an x-ray today, you will receive an invoice from [facility]. Please contact [facility] at [phone] with questions or concerns regarding your invoice.   IF you received labwork today, you will receive an invoice  from [place]. Please contact [facility] at [phone] with questions or concerns regarding your invoice.   Our billing staff will not be able to assist you with questions regarding bills from these companies.  You will be contacted with the lab results as soon as they are available. The fastest way to get your results is to activate your My Chart account. Instructions are located on the last page of this paperwork. If you have not heard from [place] regarding the results in 2 weeks, please contact this office.                            Signed, [name], MD [facility]

## 2020-07-19 NOTE — Patient Instructions (Addendum)
Continue metformin for now, depending on A1c could discuss lower dose or other options if continued diarrhea.   Try stopping the atorvastatin for next 2 weeks to see if the muscle cramps and aches improve. Let me know how that goes and we can discuss next step.   Discuss options for RA with Dr. Nickola Major tomorrow, especially if there is a thought of decreasing your prednisone. We will see if some of the cramps/pains may improve off Lipitor.   We can follow up on the R leg symptoms at next visit if nt addressed by rheumatology or neuro at upcoming appointments.   Discuss the balance and shakiness with Dr. Marjory Lies.   If more shortness of breath and chest pressure with exertion, I would recommend follow up with Dr. Eden Emms, but can also discuss this with Dr. Marjory Lies at upcoming visit.  Swelling/burning in feet could be related to the prednisone, but other causes of nerve issues in feet possible, including diabetes.  I will check your A1c, but would consider gabapentin if that ok to use with your myasthenia gravis.   Return to the clinic or go to the nearest emergency room if any of your symptoms worsen or new symptoms occur.  I do recommend social outlet of some type and continue to talk to friend in [place]. I am happy to give you some therapists if you are interested. We can discuss this further next visit.    If you have lab work done today you will be contacted with your lab results within the next 2 weeks.  If you have not heard from [place] then please contact us. The fastest way to get your results is to register for My Chart.   IF you received an x-ray today, you will receive an invoice from [facility]. Please contact [facility] at [phone] with questions or concerns regarding your invoice.   IF you received labwork today, you will receive an invoice from [place]. Please contact [facility] at [phone] with questions or concerns regarding your invoice.   Our  billing staff will not be able to assist you with questions regarding bills from these companies.  You will be contacted with the lab results as soon as they are available. The fastest way to get your results is to activate your My Chart account. Instructions are located on the last page of this paperwork. If you have not heard from [place] regarding the results in 2 weeks, please contact this office.

## 2020-07-20 LAB — COMPREHENSIVE METABOLIC PANEL
ALT: 9 IU/L (ref 0–44)
AST: 11 IU/L (ref 0–40)
Albumin/Globulin Ratio: 1.9 (ref 1.2–2.2)
Albumin: 4.6 g/dL (ref 3.8–4.8)
Alkaline Phosphatase: 64 IU/L (ref 48–121)
BUN/Creatinine Ratio: 18 (ref 10–24)
BUN: 27 mg/dL (ref 8–27)
Bilirubin Total: 0.2 mg/dL (ref 0.0–1.2)
CO2: 22 mmol/L (ref 20–29)
Calcium: 9.6 mg/dL (ref 8.6–10.2)
Chloride: 102 mmol/L (ref 96–106)
Creatinine, Ser: 1.54 mg/dL — ABNORMAL HIGH (ref 0.76–1.27)
GFR calc Af Amer: 52 mL/min/{1.73_m2} — ABNORMAL LOW (ref >59)
GFR calc non Af Amer: 45 mL/min/{1.73_m2} — ABNORMAL LOW (ref >59)
Globulin, Total: 2.4 g/dL (ref 1.5–4.5)
Glucose: 240 mg/dL — ABNORMAL HIGH (ref 65–99)
Potassium: 4.9 mmol/L (ref 3.5–5.2)
Sodium: 139 mmol/L (ref 134–144)
Total Protein: 7 g/dL (ref 6.0–8.5)

## 2020-07-20 LAB — LIPID PANEL
Chol/HDL Ratio: 2.8 ratio (ref 0.0–5.0)
Cholesterol, Total: 145 mg/dL (ref 100–199)
HDL: 52 mg/dL (ref >39)
LDL Chol Calc (NIH): 70 mg/dL (ref 0–99)
Triglycerides: 134 mg/dL (ref 0–149)
VLDL Cholesterol Cal: 23 mg/dL (ref 5–40)

## 2020-07-20 LAB — HEMOGLOBIN A1C
Est. average glucose Bld gHb Est-mCnc: 160 mg/dL
Hgb A1c MFr Bld: 7.2 % — ABNORMAL HIGH (ref 4.8–5.6)

## 2020-07-22 ENCOUNTER — Other Ambulatory Visit: Payer: Self-pay | Admitting: Family Medicine

## 2020-07-22 DIAGNOSIS — I10 Essential (primary) hypertension: Secondary | ICD-10-CM

## 2020-07-24 ENCOUNTER — Encounter: Payer: Self-pay | Admitting: Diagnostic Neuroimaging

## 2020-07-24 ENCOUNTER — Ambulatory Visit: Payer: Medicare PPO | Admitting: Diagnostic Neuroimaging

## 2020-07-24 ENCOUNTER — Other Ambulatory Visit: Payer: Self-pay

## 2020-07-24 VITALS — BP 163/65 | HR 93 | Ht 69.0 in | Wt 202.4 lb

## 2020-07-24 DIAGNOSIS — Z6829 Body mass index (BMI) 29.0-29.9, adult: Secondary | ICD-10-CM | POA: Diagnosis not present

## 2020-07-24 DIAGNOSIS — M545 Low back pain: Secondary | ICD-10-CM | POA: Diagnosis not present

## 2020-07-24 DIAGNOSIS — M0579 Rheumatoid arthritis with rheumatoid factor of multiple sites without organ or systems involvement: Secondary | ICD-10-CM | POA: Diagnosis not present

## 2020-07-24 DIAGNOSIS — M2550 Pain in unspecified joint: Secondary | ICD-10-CM | POA: Diagnosis not present

## 2020-07-24 DIAGNOSIS — M150 Primary generalized (osteo)arthritis: Secondary | ICD-10-CM | POA: Diagnosis not present

## 2020-07-24 DIAGNOSIS — G7000 Myasthenia gravis without (acute) exacerbation: Secondary | ICD-10-CM | POA: Diagnosis not present

## 2020-07-24 DIAGNOSIS — E663 Overweight: Secondary | ICD-10-CM | POA: Diagnosis not present

## 2020-07-24 DIAGNOSIS — R269 Unspecified abnormalities of gait and mobility: Secondary | ICD-10-CM | POA: Diagnosis not present

## 2020-07-24 DIAGNOSIS — M4810 Ankylosing hyperostosis [Forestier], site unspecified: Secondary | ICD-10-CM | POA: Diagnosis not present

## 2020-07-24 MED ORDER — PREDNISONE 10 MG PO TABS
60.0000 mg | ORAL_TABLET | Freq: Every day | ORAL | 12 refills | Status: DC
Start: 2020-07-24 — End: 2020-12-20

## 2020-07-24 NOTE — Patient Instructions (Addendum)
MYASTHENIA GRAVIS - continue pyridostigmine 60mg  3x per day  - order IVIG for MG flare up (home vs infusion center)  - increase prednisone to 20mg  x 1 week, then 30mg  daily x 1 week, then 40mg  daily x 1 week, then 50mg  daily x 1 week, then 60mg  daily; continue close BP and DM monitoring and control  - could consider cellcept for long term steroid sparing treatment  - would not restart imuran at this time (due to infx within 1 month of starting)  - caution with gait and balance; use cane

## 2020-07-24 NOTE — Progress Notes (Signed)
[facility]  PATIENT: Jonathan Mcgee DOB: 07/08/1951  REFERRING CLINICIAN: Deliah Boston, PA-c HISTORY FROM: patient  REASON FOR VISIT: follow up    HISTORICAL  CHIEF COMPLAINT:  Chief Complaint  Patient presents with  . Myasthenia Gravis    rm 7  6 month FU, "using a cane because my legs are shaky, off balance"    HISTORY OF PRESENT ILLNESS:   UPDATE (07/24/20, VRP): Since last visit, doing poorly; more mobility and weakness issues. Needs a cane now. Symptoms are progressive. Severity is increased. No alleviating or aggravating factors. Tolerating mestinon and prednisone.    UPDATE (01/18/20, VRP): Since last visit, more swelling in legs; rheumatology requesting to see if prednisone dosing can be reduced. Symptoms are progressive.  UPDATE (07/19/19, VRP): Since last visit, doing poorly (more fatigue, muscle weakness in arms). Had phone call with [facility] rheumatology but no other follow up. Prior joint pain and leg issues are improved. Double vision and swallowing issues are resolved. Tolerating pyridostigmine. Prednisone is completed.  UPDATE (02/24/19, VRP): Since last visit, had started on imuran 1 month ago. Then had fevers, pyuria, and weakness. Also had significant knee pain, and admitted to hospital (March 2020). Dx'd with rheumatoid arthritis and UTI; imuran was stopped.  No focal or localized signs to suggest myasthenia gravis sedation or crisis.  Prednisone 10 mg was started and patient was referred to rheumatology outpatient.  Of note patient was tested for flu and nCoV both which were negative.  Patient now back home.  He is doing well.  No fevers.  He is completing his antibiotic course.  He is on prednisone 10 mg daily.  Pain is subsided. MG symptoms are stable since last visit. Breathing, chewing, swallowing stable.   UPDATE (01/18/19, VRP): Since last visit, doing slightly worse. Mild weakness in left leg. Slightly mild generalized fatigue and weakness. Worse  with exertion. Symptoms are worse than 2019. No speech, swallow or breathing issues. Tolerating mestinon.   UPDATE (04/15/18, VRP): Since last visit, doing about the same with MG. Tolerating mestinon. No alleviating or aggravating factors. Now planning to move to [place] for work.  UPDATE 07/16/17: Since last visit, overall stable. Mild generalized fatigue continues. Overall stable. Notices some more muscle strain.   UPDATE 02/12/17: Since last visit, BP is better (meds have been adjusted). Now has CPAP machine (about to start). Sugars are better now (averaging ~150's). Intermittent double vision continues. Mild generalized fatigue, but no focal weakness.   UPDATE 12/31/16: Since last visit, IVIG completed, and general energy little better, but muscle cramps worse. BP running high. He has not checked sugars lately. PSG showed mod-severe OSA, and CPAP titration study is pending. Tolerating mestinon  BID.   UPDATE 10/09/16: Since last visit, double vision is slightly better on mestinon  TID. However having more muscle cramps, excess saliva production. Fatigable weakness is persistent.   PRIOR HPI (08/28/16): 69 year old ambidextrous male with hypertension, diabetes, hypercholesterolemia here for evaluation of myasthenia gravis. For past 1-2 years patient has had mild intermittent right-sided ptosis. In May 2017 he noticed onset of double vision, blurred vision, wavy vision. This is progressively worsened. For past 1 month he has had increasing fatigue with walking. Is having difficulty with upper and lower extremity strength. He denies any speech, swallowing, breathing difficulty. His balance has been slightly off. Patient went to PCP, ophthalmology, had [facility] antibody testing which confirmed diagnosis of myasthenia gravis.   REVIEW OF SYSTEMS: Full 14 system review of systems performed and negative  with exception of: as per HPI.   ALLERGIES: Allergies  Allergen Reactions  . Lisinopril Swelling     Facial swelling    HOME MEDICATIONS: Outpatient Medications Prior to Visit  Medication Sig Dispense Refill  . amLODipine (NORVASC) 5 MG tablet TAKE 1 & 1/2 (ONE & ONE-HALF) TABLETS BY MOUTH ONCE DAILY 135 tablet 0  . atorvastatin (LIPITOR) 20 MG tablet Take 1 tablet (20 mg total) by mouth daily. 90 tablet 1  . folic acid (FOLVITE) 1 MG tablet     . glipiZIDE (GLUCOTROL) 5 MG tablet Take 0.5 tablets (2.5 mg total) by mouth daily before breakfast. 90 tablet 1  . hydrochlorothiazide (HYDRODIURIL) 25 MG tablet Take 1 tablet (25 mg total) by mouth daily. 90 tablet 1  . losartan (COZAAR) 100 MG tablet Take 1 tablet (100 mg total) by mouth daily. 90 tablet 1  . metFORMIN (GLUCOPHAGE) 1000 MG tablet Take 1 tablet (1,000 mg total) by mouth 2 (two) times daily with a meal. 180 tablet 1  . Methotrexate 2.5 MG/ML SOLN     . predniSONE (DELTASONE) 5 MG tablet Take 3 tablets (15 mg total) by mouth daily with breakfast. 90 tablet 12  . pyridostigmine (MESTINON) 60 MG tablet Take 1 tablet (60 mg total) by mouth 3 (three) times daily. 180 tablet 4   No facility-administered medications prior to visit.    PAST MEDICAL HISTORY: Past Medical History:  Diagnosis Date  . Arthritis   . Diabetes mellitus without complication (HCC)   . Hypercholesterolemia   . Hypertension   . Myasthenia gravis (HCC)   . Rheumatoid arthritis (HCC)     PAST SURGICAL HISTORY: Past Surgical History:  Procedure Laterality Date  . CATARACT EXTRACTION, BILATERAL  2020  . KNEE SURGERY Right    fractured patella > 20 yrs ago  . PENILE PROSTHESIS IMPLANT  2015    FAMILY HISTORY: Family History  Problem Relation Age of Onset  . Diabetes Mother   . Heart disease Mother   . Hyperlipidemia Mother   . Heart attack Mother   . Hyperlipidemia Father   . Kidney failure Father   . Drug abuse Maternal Grandmother   . Heart disease Maternal Grandmother   . Hypertension Maternal Grandmother   . Hyperlipidemia Maternal  Grandmother   . Diabetes Maternal Grandmother     SOCIAL HISTORY:  Social History   Socioeconomic History  . Marital status: Single    Spouse name: Not on file  . Number of children: 1  . Years of education: 10  . Highest education level: Not on file  Occupational History    Comment: [employer]  Tobacco Use  . Smoking status: Current Some Day Smoker    Packs/day: 1.00    Types: Cigars  . Smokeless tobacco: Never Used  Substance and Sexual Activity  . Alcohol use: Yes    Comment: occas  . Drug use: No  . Sexual activity: Not on file  Other Topics Concern  . Not on file  Social History Narrative   Drinks 2-3 caffeine drinks a day    Social Determinants of Health   Financial Resource Strain:   . Difficulty of Paying Living Expenses: Not on file  Food Insecurity:   . Worried About Programme researcher, broadcasting/film/video in the Last Year: Not on file  . Ran Out of Food in the Last Year: Not on file  Transportation Needs:   . Lack of Transportation (Medical): Not on file  . Lack of Transportation (Non-Medical):  Not on file  Physical Activity:   . Days of Exercise per Week: Not on file  . Minutes of Exercise per Session: Not on file  Stress:   . Feeling of Stress : Not on file  Social Connections:   . Frequency of Communication with Friends and Family: Not on file  . Frequency of Social Gatherings with Friends and Family: Not on file  . Attends Religious Services: Not on file  . Active Member of Clubs or Organizations: Not on file  . Attends Banker Meetings: Not on file  . Marital Status: Not on file  Intimate Partner Violence:   . Fear of Current or Ex-Partner: Not on file  . Emotionally Abused: Not on file  . Physically Abused: Not on file  . Sexually Abused: Not on file     PHYSICAL EXAM  GENERAL EXAM/CONSTITUTIONAL: Vitals:  Vitals:   07/24/20 1547  BP: (!) 163/65  Pulse: 93  Weight: 202 lb 6.4 oz (91.8 kg)  Height: 5\' 9"  (1.753 m)    Wt Readings from Last 3  Encounters:  07/24/20 202 lb 6.4 oz (91.8 kg)  07/19/20 203 lb (92.1 kg)  02/29/20 181 lb (82.1 kg)   Body mass index is 29.89 kg/m. No exam data present  Patient is in no distress; well developed, nourished and groomed; neck is supple  NEUROLOGIC: MENTAL STATUS:  No flowsheet data found.  awake, alert, oriented to person, place and time  recent and remote memory intact  normal attention and concentration  language fluent, comprehension intact, naming intact,   fund of knowledge appropriate  CRANIAL NERVE:   2nd, 3rd, 4th, 6th - pupils equal and reactive to light, visual fields full to confrontation, extraocular muscles --> NO DOUBLE VISION   5th - facial sensation symmetric  7th - facial strength symmetric  8th - hearing intact  9th - palate elevates symmetrically, uvula midline  11th - shoulder shrug symmetric  12th - tongue protrusion midline  NO DYSARTHRIA  MOTOR:   BUE 4  BLE 4 PROX, 5 DISTAL  COORDINATION:   fine finger movements normal  GAIT / STATION  SHORT STEPS; CAUTIOUS GAIT; USING CANE     DIAGNOSTIC DATA (LABS, IMAGING, TESTING) - I reviewed patient records, labs, notes, testing and imaging myself where available.  Lab Results  Component Value Date   WBC 8.0 02/21/2019   HGB 10.0 (L) 02/21/2019   HCT 28.8 (L) 02/21/2019   MCV 93.8 02/21/2019   PLT 191 02/21/2019      Component Value Date/Time   NA 139 07/19/2020 1740   K 4.9 07/19/2020 1740   CL 102 07/19/2020 1740   CO2 22 07/19/2020 1740   GLUCOSE 240 (H) 07/19/2020 1740   GLUCOSE 113 (H) 02/21/2019 0244   BUN 27 07/19/2020 1740   CREATININE 1.54 (H) 07/19/2020 1740   CREATININE 1.39 (H) 06/29/2016 1626   CALCIUM 9.6 07/19/2020 1740   PROT 7.0 07/19/2020 1740   ALBUMIN 4.6 07/19/2020 1740   AST 11 07/19/2020 1740   ALT 9 07/19/2020 1740   ALKPHOS 64 07/19/2020 1740   BILITOT 0.2 07/19/2020 1740   GFRNONAA 45 (L) 07/19/2020 1740   GFRNONAA 53 (L) 06/29/2016 1626    GFRAA 52 (L) 07/19/2020 1740   GFRAA 61 06/29/2016 1626   Lab Results  Component Value Date   CHOL 145 07/19/2020   HDL 52 07/19/2020   LDLCALC 70 07/19/2020   TRIG 134 07/19/2020   CHOLHDL 2.8 07/19/2020  Hemoglobin A1C  Date Value Ref Range Status  10/18/2019 7.0 (A) 4.0 - 5.6 % Final   Hgb A1c MFr Bld  Date Value Ref Range Status  07/19/2020 7.2 (H) 4.8 - 5.6 % Final    Comment:             Prediabetes: 5.7 - 6.4          Diabetes: >6.4          Glycemic control for adults with diabetes: <7.0   01/19/2020 5.9 (H) 4.8 - 5.6 % Final    Comment:             Prediabetes: 5.7 - 6.4          Diabetes: >6.4          Glycemic control for adults with diabetes: <7.0   07/22/2019 7.2 (H) 4.8 - 5.6 % Final    Comment:             Prediabetes: 5.7 - 6.4          Diabetes: >6.4          Glycemic control for adults with diabetes: <7.0     No results found for: VITAMINB12 Lab Results  Component Value Date   TSH 3.650 10/24/2018    07/04/16 CT head [I reviewed images myself and agree with interpretation. -VRP]  - Mild microvascular disease without acute intracranial process.  09/04/16 CT chest  1. No mediastinal mass or adenopathy. 2. No infiltrate or pulmonary edema. 3. Degenerative changes thoracic spine. 4. Atherosclerotic calcifications of thoracic aorta. 5. Mild fatty infiltration of the liver.  12/31/16 LABS TPMT Activity: Units/mL RBC 31.8   Comment: Reference Range:  Normal: 15.1 - 26.4  Heterozygous for low TPMT variant: 6.3 - 15.0  Homozygous for low TPMT variant: <6.3        ASSESSMENT AND PLAN  69 y.o. year old male here with new onset diagnosis of myasthenia gravis with ocular and generalized features. Was having side effects from mestinon, but also some improvement in ocular symptoms. IVIG has helped generalized symptoms in the past.     Dx: (ocular + generalized) myasthenia gravis  1. Myasthenia gravis (HCC)   2. Arthralgia, unspecified joint    3. Gait difficulty      PLAN:  MYASTHENIA GRAVIS (progressive symptoms) - continue pyridostigmine 60mg  3x per day - order IVIG for MG flare up (home vs infusion center) - increase prednisone to 20mg  x 1 week, then 30mg  daily x 1 week, then 40mg  daily x 1 week, then 50mg  daily x 1 week, then 60mg  daily; continue close BP and DM monitoring and control - could consider cellcept for long term steroid sparing treatment - would not restart imuran at this time (due to infx within 1 month of starting) - caution with gait and balance; use cane  JOINT PAIN (positive CCP) - per rheumatology; continue methotrexate, but may need to consider reducing as we escalate prednisone  DIABETES / HYPERTENSION - continue BP and DM control  SLEEP APNEA - continue CPAP usage  Meds ordered this encounter  Medications  . predniSONE (DELTASONE) 10 MG tablet    Sig: Take 6 tablets (60 mg total) by mouth daily with breakfast.    Dispense:  180 tablet    Refill:  12    Return in about 6 months (around 01/22/2021).   , MD 07/24/2020, 4:22 PM Certified in Neurology, Neurophysiology and Neuroimaging  [facility] [address]  [place], [place] [address] [phone]

## 2020-07-25 ENCOUNTER — Telehealth: Payer: Self-pay | Admitting: *Deleted

## 2020-07-25 NOTE — Telephone Encounter (Signed)
Received my chart: I received the results of the lab tests which you ordered on 07/24/20. Can you please explain in layman's terms the results and what they mean with particular reference to the red or H denotions in the lab test results. Also advise if there is anything I should be concerned about and how best to address or mitigate against these areas to normal or standard condition.  Thank you  Jonathan Mcgee

## 2020-07-26 ENCOUNTER — Telehealth: Payer: Self-pay | Admitting: *Deleted

## 2020-07-26 ENCOUNTER — Encounter: Payer: Self-pay | Admitting: Family Medicine

## 2020-07-26 LAB — CBC WITH DIFFERENTIAL/PLATELET
Basophils Absolute: 0 10*3/uL (ref 0.0–0.2)
Basos: 0 %
EOS (ABSOLUTE): 0 10*3/uL (ref 0.0–0.4)
Eos: 0 %
Hematocrit: 29.8 % — ABNORMAL LOW (ref 37.5–51.0)
Hemoglobin: 10.5 g/dL — ABNORMAL LOW (ref 13.0–17.7)
Immature Grans (Abs): 0 10*3/uL (ref 0.0–0.1)
Immature Granulocytes: 0 %
Lymphocytes Absolute: 0.4 10*3/uL — ABNORMAL LOW (ref 0.7–3.1)
Lymphs: 4 %
MCH: 32.8 pg (ref 26.6–33.0)
MCHC: 35.2 g/dL (ref 31.5–35.7)
MCV: 93 fL (ref 79–97)
Monocytes Absolute: 0.5 10*3/uL (ref 0.1–0.9)
Monocytes: 5 %
Neutrophils Absolute: 9.1 10*3/uL — ABNORMAL HIGH (ref 1.4–7.0)
Neutrophils: 91 %
Platelets: 254 10*3/uL (ref 150–450)
RBC: 3.2 x10E6/uL — ABNORMAL LOW (ref 4.14–5.80)
RDW: 14.5 % (ref 11.6–15.4)
WBC: 10 10*3/uL (ref 3.4–10.8)

## 2020-07-26 LAB — REFLEX TEST INFORMATION

## 2020-07-26 LAB — ACHR ABS WITH REFLEX TO MUSK: AChR Binding Ab, Serum: 1.27 nmol/L — ABNORMAL HIGH (ref 0.00–0.24)

## 2020-07-26 NOTE — Telephone Encounter (Signed)
Anemia noted; can cause fatigue. PCP follow up to find out cause and treatment. Neutrophils up could be from prednisone. Monitor. -VRP

## 2020-07-26 NOTE — Telephone Encounter (Signed)
Received fax from [place], Gammuex- C approved until 11/24/2020. Approval letter faxed to [facility] Clinical Team, F [phone].

## 2020-07-26 NOTE — Telephone Encounter (Signed)
IVIG home infusion PA, key: [id number], [id number], informaiton submitted to [facility].

## 2020-07-26 NOTE — Telephone Encounter (Signed)
Received from [facility]: Your provider has decided to change your prescription to one of [employer]s covered alternatives. The originally requested drug is not on [employer] preferred drug list (formulary). Approval of the originally requested drug (called a formulary exception) requires that you first try some of the preferred drugs on your [facility] drug list (formulary), or tell us if they were/are not right for you. The drugs on [employer] drug list include: Gamunex-C injection solution. Your providers statement didnt say the drugs on [employer] drug list: wont or havent worked, or that they would cause a bad drug reaction or incorrect usage. Your original request doesnt meet the coverage criteria for a formulary exception, but your provider has decided to change your prescription to one of [employer]s covered alternatives. Some preferred drugs may require an additional review and approval by [facility]. Informed Jonathan Mcgee with [facility].

## 2020-07-27 ENCOUNTER — Encounter: Payer: Self-pay | Admitting: *Deleted

## 2020-07-27 NOTE — Telephone Encounter (Addendum)
Called patient and advised per Dr Marjory Lies, he recommends 5 days of treatment started as soon as possible, even if it cannot be done in 5 consecutive days. Patient agreed to have 5 days at [facility] infusion center pending their availability. I advised him will call and update him today. Patient verbalized understanding, appreciation. Called [facility], spoke with Aundra Millet RN who stated they are open next Tues-Thurs and open every 4th Sat. They will process infusion request as soon as possible. I printed RX from their website, completed, signed by MD. Arneta Cliche office note, recent labs, demographics, insurance, Rx, [employer] approval letter re: Gammunex-C.

## 2020-07-27 NOTE — Telephone Encounter (Signed)
Pam, RN with [facility] is asking for a call  Back from [place] C,RN @ [phone]

## 2020-07-27 NOTE — Telephone Encounter (Signed)
Gasper Lloyd, RN with [facility] to get update on IVIG (gamunex C therapy). She stated she is calling patient today to discuss his co pay which is > $3000 because he has not paid anything toward his deductible. He is not eligible for co pay assistance with any pharmacy because he only has Medicare, no supplemental insurance. No one offers co pay assistance for governmental insurances. If [facility] is able to work out a payment plan for him, they can begin infusions next Monday. I requested she keep me posted as we are closed tomorrow.  Notified MD of this.

## 2020-07-27 NOTE — Telephone Encounter (Signed)
Pt sending you an update from Dr. Lendon Colonel

## 2020-07-27 NOTE — Telephone Encounter (Signed)
Returned call from [employer] who spoke with patient. Patient doesn't want to get infusion 5 days in a row, stated he only wants 3. He wants to get infusion at [facility]. I thanked Pam for her efforts.

## 2020-07-28 NOTE — Telephone Encounter (Addendum)
Written order forms for MD to sign have not been received as of yet.

## 2020-08-01 DIAGNOSIS — R531 Weakness: Secondary | ICD-10-CM | POA: Diagnosis not present

## 2020-08-01 DIAGNOSIS — Z79899 Other long term (current) drug therapy: Secondary | ICD-10-CM | POA: Diagnosis not present

## 2020-08-01 NOTE — Telephone Encounter (Signed)
Received fax on 07-28-20, 12:30 pm from [facility] asking for patient information. All information requested was faxed over on 07/27/20 with exception of CT chest report. This was added to all requested information and faxed again to [place], f [phone].

## 2020-08-01 NOTE — Telephone Encounter (Addendum)
Called [facility] to check status of Gammunex -C infusions, spoke with Contractor, Judeth Cornfield who stated they have submitted PA to [facility] for [place] per their pharmacy. They cannot use Gammunex-C PA because they bill through medical insurance, not pharmacy insurance.  They will notify patient once they hear from [facility].  Called patient and advised of above conversation and asked about his status. He stated he is currently waiting at rheumatology to get labs drawn. He stated "everyone is doing the best they can, and he understands we have to wait for insurance". I adivsed he call for any problems, questions. Patient verbalized understanding, appreciation.

## 2020-08-02 ENCOUNTER — Encounter: Payer: Self-pay | Admitting: *Deleted

## 2020-08-02 NOTE — Telephone Encounter (Signed)
Dr Marjory Lies wrote orders for Gammunex -C on [facility] physicians sheet. Faxed orders with med list, [employer] approval letter for Gammunex -C approved through 11/24/20 to [facility] Short Stay. Received confirmation of fax. Notified patient via my chart.

## 2020-08-02 NOTE — Telephone Encounter (Addendum)
Received my chart: This is a follow up to my conversation today with Doristine Section regarding [employer]. As discussed my out of pocket expense with having this procedure done would be ~$5K as an outpatient for the 5 days. However I contacted [employer] and discussed being admitted as an outpatient at a [facility]) hospital and was told that my out of pocket expense for this procedure would $450/day or $2,250 and nothing else. Is it possible to have IVIG done at hospital. I can afford to pay at $450/day.  Replied to patient to clarify if he is asking to be admitted to hospital x 5 days or go as outpatient.    Based upon my conversation with [facility] if I were to go to the hospital as an outpatient I the out of pocket costs would be based upon me going at $450/day. The approval which [employer] provided for the medication was based upon being an outpatient having a surgical/medical procedure.  [facility]; they do not infuse IVIG. Called [facility] short stay, had to LVM for call back. Called [facility], no answer. Called Bayard Hugger, practice admin, LVM.

## 2020-08-02 NOTE — Telephone Encounter (Signed)
Pt called back. Pt states he will reach out to RN via MyChart.

## 2020-08-02 NOTE — Telephone Encounter (Signed)
Pt called stating he is needing to speak to the provider or RN regarding his IVIG. Please advise.

## 2020-08-02 NOTE — Telephone Encounter (Signed)
Called patient who stated he talked to [facility] today, and per insurance his co pay is $50  per visit + $856 per day x 5 days. This is cost prohibitive for him. He asked if there is an other treatment.  I advised will talk to Dr Marjory Lies and let him know. Patient verbalized understanding, appreciation.

## 2020-08-03 ENCOUNTER — Telehealth: Payer: Self-pay | Admitting: *Deleted

## 2020-08-03 NOTE — Telephone Encounter (Signed)
Spoke to [facility] infusion center, Pt scheduled for 08-14-20 at 9am  Pt aware  Relayed message from Dr. Marjory Lies  If any worsening of symptoms before then, may need to consider ER evaluation and admission for IVIG.  Pt voiced understanding

## 2020-08-03 NOTE — Telephone Encounter (Addendum)
Received a call from the Infusion Center at [phone]) regarding this patient.  They state that they unable to start treatment for pt until 9/20 They mention [facility] would be able to start sooner, Spoke to [facility] and was told they do not give this treatment.  Please advise

## 2020-08-03 NOTE — Telephone Encounter (Signed)
Monitor symptoms; request patient to be put on cancellation list. Ok for 08/14/20 for now. If any worsening of symptoms before then, may need to consider ER evaluation and admission for IVIG. -VRP

## 2020-08-03 NOTE — Telephone Encounter (Signed)
Received a call from the Infusion Center at [facility] regarding this patient. They can be reached at [phone]

## 2020-08-07 ENCOUNTER — Telehealth: Payer: Self-pay | Admitting: Diagnostic Neuroimaging

## 2020-08-07 NOTE — Telephone Encounter (Signed)
Vernona Rieger from [facility] called and LVM regarding the referral they received for the pt's IVIG. Vernona Rieger states pt has requested to cancel this referral due to not being able to afford the $856 he would have to pay per visit. Vernona Rieger was wanting to advise the office.

## 2020-08-08 NOTE — Telephone Encounter (Signed)
Patient is scheduled for Gammunex C infusions at [facility] 08/14/20 through 08/18/20.

## 2020-08-09 ENCOUNTER — Ambulatory Visit: Payer: Medicare PPO | Admitting: Family Medicine

## 2020-08-09 ENCOUNTER — Encounter: Payer: Self-pay | Admitting: Family Medicine

## 2020-08-09 ENCOUNTER — Other Ambulatory Visit: Payer: Self-pay

## 2020-08-09 VITALS — BP 134/60 | HR 73 | Temp 98.2°F | Ht 69.0 in | Wt 200.0 lb

## 2020-08-09 DIAGNOSIS — G7000 Myasthenia gravis without (acute) exacerbation: Secondary | ICD-10-CM | POA: Diagnosis not present

## 2020-08-09 DIAGNOSIS — Z7189 Other specified counseling: Secondary | ICD-10-CM | POA: Diagnosis not present

## 2020-08-09 DIAGNOSIS — E1122 Type 2 diabetes mellitus with diabetic chronic kidney disease: Secondary | ICD-10-CM

## 2020-08-09 DIAGNOSIS — E785 Hyperlipidemia, unspecified: Secondary | ICD-10-CM | POA: Diagnosis not present

## 2020-08-09 DIAGNOSIS — Z7185 Encounter for immunization safety counseling: Secondary | ICD-10-CM

## 2020-08-09 NOTE — Patient Instructions (Addendum)
  I would like to see if higher dose of prednisone and IVIG will help your symptoms and ultimately you leg symptoms.  We certainly can try stopping statin temporarily as well and can recheck muscle enzyme test at any point as well- let me know if you want to make any changes prior to max prednisone dose.  I do recommend covid 19 booster.   [email or web address]  We do need to keep a close eye on your blood sugars with the higher prednisone dosing.  Check your blood sugars once per day either fasting or 2 hours after meals.  You can mix up each day which 1 you check.  Please send me an update with those readings in the next 10 days,  but I would like to follow-up with you in 3 weeks to review those readings and other changes to your diabetes medicines.    If you have lab work done today you will be contacted with your lab results within the next 2 weeks.  If you have not heard from [place] then please contact us. The fastest way to get your results is to register for My Chart.   IF you received an x-ray today, you will receive an invoice from [facility]. Please contact [facility] at [phone] with questions or concerns regarding your invoice.   IF you received labwork today, you will receive an invoice from [facility]. Please contact [facility] at [phone] with questions or concerns regarding your invoice.   Our billing staff will not be able to assist you with questions regarding bills from these companies.  You will be contacted with the lab results as soon as they are available. The fastest way to get your results is to activate your My Chart account. Instructions are located on the last page of this paperwork. If you have not heard from [place] regarding the results in 2 weeks, please contact this office.

## 2020-08-09 NOTE — Progress Notes (Signed)
Subjective:  Patient ID: Jonathan Mcgee, male    DOB: 1951-01-13  Age: 69 y.o. MRN: [id number]  CC:  Chief Complaint  Patient presents with  . Follow-up    on leg and feet pain. PT reports that he still has some discomfort, but has noticed a reduction in sweeling. PT states he has notice a increase in loss of balance and wobly legs since last OV. Pt reports he has 2 questions about his atrovastatin and a question is reguasrding the COVID vaccine and if a booster would be nessisary for him and if so how to go about getting it.    HPI Jonathan Mcgee presents for   Follow-up of leg pain and balance difficulty. See office visit August 25. History of myasthenia gravis, rheumatoid arthritis, followed by neurologist Dr. Marjory Lies and Dr. Nickola Major for rheumatology. Currently on Mestinon and prednisone.  Plan for possible IVIG to help with myasthenia gravis flareup, will be doing at hospital. Next Monday starts for 5 consecutive days.  Plan for prednisone increased to 20 mg for 1 week, then 30 mg daily for 1 week, then 40 mg for 1 week, then 50 mg for 1 week, then 60 mg daily.  Close blood pressure and diabetes monitoring and control planned.  Did not plan on restarting Imuran as he did have infection within 1 month of starting previously.  Consider CellCept for long-term steroid sparing treatment.  Continued on pyridostigmine 60 mg 3 times per day.  26-month follow-up planned. Now up to 30mg  per day. Feels more off balance in the morning, no falls. Using cane. Has good days with balance.    Hyperlipidemia: Discussed arthralgias, myalgias last visit.  Likely contributing from osteoarthritis, disc, RA.  Option to stop lovastatin for 2 weeks discussed. Stopped lovastatin few days only, no change in leg pain.  Feels safer taking it but read about some of the concerns about statins.  Lab Results  Component Value Date   CHOL 145 07/19/2020   HDL 52 07/19/2020   LDLCALC 70 07/19/2020   TRIG 134  07/19/2020   CHOLHDL 2.8 07/19/2020   Lab Results  Component Value Date   ALT 9 07/19/2020   AST 11 07/19/2020   ALKPHOS 64 07/19/2020   BILITOT 0.2 07/19/2020    Question regarding COVID-19 vaccine booster discussed. He will get scheduled.   Diabetes: Diarrhea intermittent for years, no recent changes. Does not want to change metformin at this time.  Lab Results  Component Value Date   HGBA1C 7.2 (H) 07/19/2020   HGBA1C 5.9 (H) 01/19/2020   HGBA1C 7.0 (A) 10/18/2019   Lab Results  Component Value Date   LDLCALC 70 07/19/2020   CREATININE 1.54 (H) 07/19/2020     History Patient Active Problem List   Diagnosis Date Noted  . Rheumatoid arthritis (HCC) 09/23/2019  . Morbid obesity (HCC) 07/19/2019  . UTI (urinary tract infection) 02/20/2019  . Suspected COVID-19 virus infection 02/20/2019  . Type 2 diabetes mellitus (HCC) 02/20/2019  . Sepsis (HCC) 02/19/2019  . CKD (chronic kidney disease) 04/12/2017  . Essential hypertension 01/13/2017  . Hyperlipidemia 01/13/2017  . Myasthenia gravis (HCC) 08/28/2016   Past Medical History:  Diagnosis Date  . Arthritis   . Diabetes mellitus without complication (HCC)   . Hypercholesterolemia   . Hypertension   . Myasthenia gravis (HCC)   . Rheumatoid arthritis [facility])    Past Surgical History:  Procedure Laterality Date  . CATARACT EXTRACTION, BILATERAL  2020  . KNEE SURGERY Right  fractured patella > 20 yrs ago  . PENILE PROSTHESIS IMPLANT  2015   Allergies  Allergen Reactions  . Lisinopril Swelling    Facial swelling   Prior to Admission medications   Medication Sig Start Date End Date Taking? Authorizing Provider  amLODipine (NORVASC) 5 MG tablet TAKE 1 & 1/2 (ONE & ONE-HALF) TABLETS BY MOUTH ONCE DAILY 07/23/20   Shade Flood, MD  atorvastatin (LIPITOR) 20 MG tablet Take 1 tablet (20 mg total) by mouth daily. 01/19/20   Shade Flood, MD  folic acid (FOLVITE) 1 MG tablet     [provider]    glipiZIDE (GLUCOTROL) 5 MG tablet Take 0.5 tablets (2.5 mg total) by mouth daily before breakfast. 01/19/20   Shade Flood, MD  hydrochlorothiazide (HYDRODIURIL) 25 MG tablet Take 1 tablet (25 mg total) by mouth daily. 01/19/20   Shade Flood, MD  losartan (COZAAR) 100 MG tablet Take 1 tablet (100 mg total) by mouth daily. 01/19/20   Shade Flood, MD  metFORMIN (GLUCOPHAGE) 1000 MG tablet Take 1 tablet (1,000 mg total) by mouth 2 (two) times daily with a meal. 01/19/20   Shade Flood, MD  Methotrexate 2.5 MG/ML SOLN  09/24/19   [provider]  predniSONE (DELTASONE) 10 MG tablet Take 6 tablets (60 mg total) by mouth daily with breakfast. 07/24/20   Penumalli, Glenford Bayley, MD  pyridostigmine (MESTINON) 60 MG tablet Take 1 tablet (60 mg total) by mouth 3 (three) times daily. 07/19/19   Penumalli, Glenford Bayley, MD   Social History   Socioeconomic History  . Marital status: Single    Spouse name: Not on file  . Number of children: 1  . Years of education: 61  . Highest education level: Not on file  Occupational History    Comment: [employer]  Tobacco Use  . Smoking status: Current Some Day Smoker    Packs/day: 1.00    Types: Cigars  . Smokeless tobacco: Never Used  Substance and Sexual Activity  . Alcohol use: Yes    Comment: occas  . Drug use: No  . Sexual activity: Not on file  Other Topics Concern  . Not on file  Social History Narrative   Drinks 2-3 caffeine drinks a day    Social Determinants of Health   Financial Resource Strain:   . Difficulty of Paying Living Expenses: Not on file  Food Insecurity:   . Worried About Programme researcher, broadcasting/film/video in the Last Year: Not on file  . Ran Out of Food in the Last Year: Not on file  Transportation Needs:   . Lack of Transportation (Medical): Not on file  . Lack of Transportation (Non-Medical): Not on file  Physical Activity:   . Days of Exercise per Week: Not on file  . Minutes of Exercise per Session: Not on file  Stress:    . Feeling of Stress : Not on file  Social Connections:   . Frequency of Communication with Friends and Family: Not on file  . Frequency of Social Gatherings with Friends and Family: Not on file  . Attends Religious Services: Not on file  . Active Member of Clubs or Organizations: Not on file  . Attends Banker Meetings: Not on file  . Marital Status: Not on file  Intimate Partner Violence:   . Fear of Current or Ex-Partner: Not on file  . Emotionally Abused: Not on file  . Physically Abused: Not on file  . Sexually  Abused: Not on file    Review of Systems Per HPI  Objective:   Vitals:   08/09/20 1647 08/09/20 1651  BP: (!) 149/64 134/60  Pulse: 73   Temp: 98.2 F (36.8 C)   TempSrc: Temporal   SpO2: 96%   Weight: 200 lb (90.7 kg)   Height: 5\' 9"  (1.753 m)      Physical Exam Constitutional:      General: He is not in acute distress.    Appearance: He is well-developed.  HENT:     Head: Normocephalic and atraumatic.  Cardiovascular:     Rate and Rhythm: Normal rate.  Pulmonary:     Effort: Pulmonary effort is normal.  Neurological:     Mental Status: He is alert and oriented to person, place, and time.  Psychiatric:        Mood and Affect: Mood normal.        Behavior: Behavior normal.      Assessment & Plan:  Deng Kemler is a 69 y.o. male . Type 2 diabetes mellitus with chronic kidney disease, without long-term current use of insulin, unspecified CKD stage (HCC)  -Slight increase A1c previously.  Anticipate some worsening control with higher doses of prednisone.  Option of different medication with diarrhea and Metformin but declined at this time.   - Close monitoring, daily glucose readings over the next few weeks with update by MyChart in the next 10 days, and office eval 3 weeks.  May need to readd sulfonylurea, or can look at other classes to see if they may be more cost effective at this time.  MG (myasthenia gravis) (HCC)  -Now on  increasing doses of prednisone, and will be starting IVIG..  Would like to see if his lower extremity symptoms may improve.  Could discuss other testing/possibilities with neurology if not improving.  Does not appear to be due to rheumatologic cause.  Hyperlipidemia, unspecified hyperlipidemia type  -Unlikely statin myopathy, previous CPK was normal.  Repeat CPK offered, declined at this time.  Option of trial of statin also discussed, more than just a few days as previously tried.  He would like to remain on same dose of statin at this time.  Risks and benefits were discussed to statins as well as his questions were answered.  Can certainly try statin holiday after he reaches maximum dose of prednisone and has received IVIG if still having lower extremity discomfort.  Vaccine counseling  -Did recommend booster of COVID-19 vaccine given his underlying health conditions, immunosuppression.  No orders of the defined types were placed in this encounter.  Patient Instructions    I would like to see if higher dose of prednisone and IVIG will help your symptoms and ultimately you leg symptoms.  We certainly can try stopping statin temporarily as well and can recheck muscle enzyme test at any point as well- let me know if you want to make any changes prior to max prednisone dose.  I do recommend covid 19 booster.   65  We do need to keep a close eye on your blood sugars with the higher prednisone dosing.  Check your blood sugars once per day either fasting or 2 hours after meals.  You can mix up each day which 1 you check.  Please send me an update with those readings in the next 10 days,  but I would like to follow-up with you in 3 weeks to review those readings and other changes to your diabetes medicines.  If you have lab work done today you will be contacted with your lab results within the next 2 weeks.  If you have not heard from [place] then please contact us. The  fastest way to get your results is to register for My Chart.   IF you received an x-ray today, you will receive an invoice from [facility]. Please contact [facility] at [phone] with questions or concerns regarding your invoice.   IF you received labwork today, you will receive an invoice from [facility]. Please contact [facility] at [phone] with questions or concerns regarding your invoice.   Our billing staff will not be able to assist you with questions regarding bills from these companies.  You will be contacted with the lab results as soon as they are available. The fastest way to get your results is to activate your My Chart account. Instructions are located on the last page of this paperwork. If you have not heard from [place] regarding the results in 2 weeks, please contact this office.         Signed, [name], MD [facility]

## 2020-08-11 ENCOUNTER — Other Ambulatory Visit (HOSPITAL_COMMUNITY): Payer: Self-pay

## 2020-08-12 ENCOUNTER — Encounter: Payer: Self-pay | Admitting: Family Medicine

## 2020-08-14 ENCOUNTER — Ambulatory Visit (HOSPITAL_COMMUNITY)
Admission: RE | Admit: 2020-08-14 | Discharge: 2020-08-14 | Disposition: A | Discharge status: Home / Self Care | Payer: Medicare PPO | Source: Ambulatory Visit | Attending: Diagnostic Neuroimaging | Admitting: Diagnostic Neuroimaging

## 2020-08-14 ENCOUNTER — Other Ambulatory Visit: Payer: Self-pay

## 2020-08-14 DIAGNOSIS — G7001 Myasthenia gravis with (acute) exacerbation: Secondary | ICD-10-CM | POA: Diagnosis not present

## 2020-08-14 MED ORDER — DIPHENHYDRAMINE HCL 25 MG PO CAPS
ORAL_CAPSULE | ORAL | Status: AC
  Administered 2020-08-14: 25 mg
  Filled 2020-08-14: qty 1

## 2020-08-14 MED ORDER — ACETAMINOPHEN 325 MG PO TABS: 650.0000 mg | ORAL_TABLET | Freq: Once | ORAL | Status: DC

## 2020-08-14 MED ORDER — DIPHENHYDRAMINE HCL 25 MG PO CAPS: 25.0000 mg | ORAL_CAPSULE | Freq: Once | ORAL | Status: DC

## 2020-08-14 MED ORDER — IMMUNE GLOBULIN (HUMAN) 10 GM/100ML IJ SOLN
0.4000 g/kg | INTRAMUSCULAR | Status: DC
  Administered 2020-08-14: 35 g via INTRAVENOUS
  Filled 2020-08-14: qty 200

## 2020-08-15 ENCOUNTER — Ambulatory Visit (HOSPITAL_COMMUNITY)
Admission: RE | Admit: 2020-08-15 | Discharge: 2020-08-15 | Disposition: A | Discharge status: Home / Self Care | Payer: Medicare PPO | Source: Ambulatory Visit | Attending: Diagnostic Neuroimaging | Admitting: Diagnostic Neuroimaging

## 2020-08-15 ENCOUNTER — Other Ambulatory Visit: Payer: Self-pay

## 2020-08-15 DIAGNOSIS — G7001 Myasthenia gravis with (acute) exacerbation: Secondary | ICD-10-CM | POA: Diagnosis not present

## 2020-08-15 MED ORDER — DIPHENHYDRAMINE HCL 25 MG PO CAPS: 25.0000 mg | ORAL_CAPSULE | Freq: Every day | ORAL | Status: DC

## 2020-08-15 MED ORDER — ACETAMINOPHEN 325 MG PO TABS
ORAL_TABLET | ORAL | Status: AC
  Administered 2020-08-15: 650 mg
  Filled 2020-08-15: qty 2

## 2020-08-15 MED ORDER — ACETAMINOPHEN 325 MG PO TABS: 650.0000 mg | ORAL_TABLET | Freq: Every day | ORAL | Status: DC

## 2020-08-15 MED ORDER — DIPHENHYDRAMINE HCL 25 MG PO CAPS
ORAL_CAPSULE | ORAL | Status: AC
  Administered 2020-08-15: 25 mg
  Filled 2020-08-15: qty 1

## 2020-08-15 MED ORDER — IMMUNE GLOBULIN (HUMAN) 10 GM/100ML IJ SOLN
0.4000 g/kg | INTRAMUSCULAR | Status: DC
  Administered 2020-08-15: 35 g via INTRAVENOUS
  Filled 2020-08-15: qty 150

## 2020-08-16 ENCOUNTER — Ambulatory Visit (HOSPITAL_COMMUNITY)
Admission: RE | Admit: 2020-08-16 | Discharge: 2020-08-16 | Disposition: A | Discharge status: Home / Self Care | Payer: Medicare PPO | Source: Ambulatory Visit | Attending: Diagnostic Neuroimaging | Admitting: Diagnostic Neuroimaging

## 2020-08-16 ENCOUNTER — Other Ambulatory Visit: Payer: Self-pay

## 2020-08-16 DIAGNOSIS — G7001 Myasthenia gravis with (acute) exacerbation: Secondary | ICD-10-CM | POA: Diagnosis not present

## 2020-08-16 MED ORDER — IMMUNE GLOBULIN (HUMAN) 10 GM/100ML IJ SOLN
0.4000 g/kg | INTRAMUSCULAR | Status: DC
  Administered 2020-08-16: 35 g via INTRAVENOUS
  Filled 2020-08-16: qty 200

## 2020-08-16 MED ORDER — DIPHENHYDRAMINE HCL 25 MG PO CAPS
ORAL_CAPSULE | ORAL | Status: AC
  Filled 2020-08-16: qty 1

## 2020-08-16 MED ORDER — DIPHENHYDRAMINE HCL 25 MG PO CAPS
25.0000 mg | ORAL_CAPSULE | Freq: Every day | ORAL | Status: DC
  Administered 2020-08-16: 25 mg via ORAL

## 2020-08-16 MED ORDER — ACETAMINOPHEN 325 MG PO TABS: 650.0000 mg | ORAL_TABLET | Freq: Every day | ORAL | Status: DC

## 2020-08-17 ENCOUNTER — Other Ambulatory Visit: Payer: Self-pay

## 2020-08-17 ENCOUNTER — Ambulatory Visit (HOSPITAL_COMMUNITY)
Admission: RE | Admit: 2020-08-17 | Discharge: 2020-08-17 | Disposition: A | Discharge status: Home / Self Care | Payer: Medicare PPO | Source: Ambulatory Visit | Attending: Diagnostic Neuroimaging | Admitting: Diagnostic Neuroimaging

## 2020-08-17 DIAGNOSIS — G7001 Myasthenia gravis with (acute) exacerbation: Secondary | ICD-10-CM | POA: Diagnosis not present

## 2020-08-17 MED ORDER — IMMUNE GLOBULIN (HUMAN) 10 GM/100ML IJ SOLN
0.4000 g/kg | INTRAMUSCULAR | Status: DC
  Administered 2020-08-17: 35 g via INTRAVENOUS
  Filled 2020-08-17: qty 350

## 2020-08-17 MED ORDER — ACETAMINOPHEN 325 MG PO TABS: 650.0000 mg | ORAL_TABLET | Freq: Every day | ORAL | Status: DC

## 2020-08-17 MED ORDER — DIPHENHYDRAMINE HCL 25 MG PO CAPS
ORAL_CAPSULE | ORAL | Status: AC
  Filled 2020-08-17: qty 1

## 2020-08-17 MED ORDER — DIPHENHYDRAMINE HCL 25 MG PO CAPS
25.0000 mg | ORAL_CAPSULE | Freq: Every day | ORAL | Status: DC
  Administered 2020-08-17: 25 mg via ORAL

## 2020-08-18 ENCOUNTER — Encounter (HOSPITAL_COMMUNITY)
Admission: RE | Admit: 2020-08-18 | Discharge: 2020-08-18 | Disposition: A | Discharge status: Home / Self Care | Payer: Medicare PPO | Source: Ambulatory Visit | Attending: Diagnostic Neuroimaging | Admitting: Diagnostic Neuroimaging

## 2020-08-18 ENCOUNTER — Other Ambulatory Visit: Payer: Self-pay

## 2020-08-18 DIAGNOSIS — G7001 Myasthenia gravis with (acute) exacerbation: Secondary | ICD-10-CM | POA: Insufficient documentation

## 2020-08-18 MED ORDER — IMMUNE GLOBULIN (HUMAN) 10 GM/100ML IJ SOLN
0.4000 g/kg | INTRAMUSCULAR | Status: DC
  Administered 2020-08-18: 35 g via INTRAVENOUS
  Filled 2020-08-18: qty 350
  Filled 2020-08-18: qty 200

## 2020-08-18 MED ORDER — DIPHENHYDRAMINE HCL 25 MG PO CAPS
ORAL_CAPSULE | ORAL | Status: AC
  Filled 2020-08-18: qty 1

## 2020-08-18 MED ORDER — ACETAMINOPHEN 325 MG PO TABS: 650.0000 mg | ORAL_TABLET | Freq: Every day | ORAL | Status: DC

## 2020-08-18 MED ORDER — DIPHENHYDRAMINE HCL 25 MG PO CAPS
25.0000 mg | ORAL_CAPSULE | Freq: Every day | ORAL | Status: DC
  Administered 2020-08-18: 25 mg via ORAL

## 2020-08-21 ENCOUNTER — Other Ambulatory Visit: Payer: Self-pay | Admitting: Family Medicine

## 2020-08-21 DIAGNOSIS — E785 Hyperlipidemia, unspecified: Secondary | ICD-10-CM

## 2020-08-21 DIAGNOSIS — I10 Essential (primary) hypertension: Secondary | ICD-10-CM

## 2020-08-21 DIAGNOSIS — E1122 Type 2 diabetes mellitus with diabetic chronic kidney disease: Secondary | ICD-10-CM

## 2020-08-21 MED ORDER — LOSARTAN POTASSIUM 100 MG PO TABS: 100.0000 mg | ORAL_TABLET | Freq: Every day | ORAL | 1 refills | Status: DC

## 2020-08-21 MED ORDER — HYDROCHLOROTHIAZIDE 25 MG PO TABS: 25.0000 mg | ORAL_TABLET | Freq: Every day | ORAL | 1 refills | Status: DC

## 2020-08-21 MED ORDER — ATORVASTATIN CALCIUM 20 MG PO TABS: 20.0000 mg | ORAL_TABLET | Freq: Every day | ORAL | 3 refills | Status: DC

## 2020-08-21 MED ORDER — METFORMIN HCL 1000 MG PO TABS: 1000.0000 mg | ORAL_TABLET | Freq: Two times a day (BID) | ORAL | 1 refills | Status: DC

## 2020-08-21 NOTE — Telephone Encounter (Signed)
Requested Prescriptions  Pending Prescriptions Disp Refills  . amLODipine (NORVASC) 5 MG tablet 135 tablet      Cardiovascular:  Calcium Channel Blockers Passed - 08/21/2020 10:33 AM      Passed - Last BP in normal range    BP Readings from Last 1 Encounters:  08/18/20 137/62         Passed - Valid encounter within last 6 months    Recent Outpatient Visits          1 week ago Type 2 diabetes mellitus with chronic kidney disease, without long-term current use of insulin, unspecified CKD stage [facility])   [facility] [place], Ranell Patrick, MD   1 month ago Type 2 diabetes mellitus with chronic kidney disease, without long-term current use of insulin, unspecified CKD stage [facility])   [facility] at [place], Ranell Patrick, MD   7 months ago Type 2 diabetes mellitus with chronic kidney disease, without long-term current use of insulin, unspecified CKD stage [facility])   [facility] at [place], Ranell Patrick, MD   7 months ago Medicare annual wellness visit, subsequent   [facility] [place], Ranell Patrick, MD   10 months ago Type 2 diabetes mellitus with chronic kidney disease, without long-term current use of insulin, unspecified CKD stage [facility])   [facility] [place], Ranell Patrick, MD      Future Appointments            In 2 weeks Carlota Raspberry Ranell Patrick, MD Primary Care at [place], [facility]           . atorvastatin (LIPITOR) 20 MG tablet 90 tablet 3    Sig: Take 1 tablet (20 mg total) by mouth daily.     Cardiovascular:  Antilipid - Statins Failed - 08/21/2020 10:33 AM      Failed - LDL in normal range and within 360 days    LDL Chol Calc (NIH)  Date Value Ref Range Status  07/19/2020 70 0 - 99 mg/dL Final         Passed - Total Cholesterol in normal range and within 360 days    Cholesterol, Total  Date Value Ref Range Status  07/19/2020 145 100 - 199 mg/dL Final         Passed - HDL in normal range and within 360 days    HDL  Date Value Ref Range Status  07/19/2020  52 >39 mg/dL Final         Passed - Triglycerides in normal range and within 360 days    Triglycerides  Date Value Ref Range Status  07/19/2020 134 0 - 149 mg/dL Final         Passed - Patient is not pregnant      Passed - Valid encounter within last 12 months    Recent Outpatient Visits          1 week ago Type 2 diabetes mellitus with chronic kidney disease, without long-term current use of insulin, unspecified CKD stage ([place])   [facility] at [place] [facility], Ranell Patrick, MD   1 month ago Type 2 diabetes mellitus with chronic kidney disease, without long-term current use of insulin, unspecified CKD stage [facility])   [facility] at [place], Ranell Patrick, MD   7 months ago Type 2 diabetes mellitus with chronic kidney disease, without long-term current use of insulin, unspecified CKD stage [facility])   [facility] at [place], Ranell Patrick, MD   7 months  ago Medicare annual wellness visit, subsequent   [facility] at [place], Ranell Patrick, MD   10 months ago Type 2 diabetes mellitus with chronic kidney disease, without long-term current use of insulin, unspecified CKD stage [facility])   [facility] at [place], Ranell Patrick, MD      Future Appointments            In 2 weeks Wendie Agreste, MD Primary Care at [place], [facility]           . hydrochlorothiazide (HYDRODIURIL) 25 MG tablet 90 tablet 1    Sig: Take 1 tablet (25 mg total) by mouth daily.     Cardiovascular: Diuretics - Thiazide Failed - 08/21/2020 10:33 AM      Failed - Cr in normal range and within 360 days    Creat  Date Value Ref Range Status  06/29/2016 1.39 (H) 0.70 - 1.25 mg/dL Final    Comment:      For patients > or = 70 years of age: The upper reference limit for Creatinine is approximately 13% higher for people identified as African-American.      Creatinine, Ser  Date Value Ref Range Status  07/19/2020 1.54 (H) 0.76 - 1.27 mg/dL Final         Passed - Ca in normal range and within 360 days     Calcium  Date Value Ref Range Status  07/19/2020 9.6 8.6 - 10.2 mg/dL Final         Passed - K in normal range and within 360 days    Potassium  Date Value Ref Range Status  07/19/2020 4.9 3.5 - 5.2 mmol/L Final         Passed - Na in normal range and within 360 days    Sodium  Date Value Ref Range Status  07/19/2020 139 134 - 144 mmol/L Final         Passed - Last BP in normal range    BP Readings from Last 1 Encounters:  08/18/20 137/62         Passed - Valid encounter within last 6 months    Recent Outpatient Visits          1 week ago Type 2 diabetes mellitus with chronic kidney disease, without long-term current use of insulin, unspecified CKD stage ([place])   [facility] [place] [facility], Ranell Patrick, MD   1 month ago Type 2 diabetes mellitus with chronic kidney disease, without long-term current use of insulin, unspecified CKD stage [facility])   [facility] at [place], Ranell Patrick, MD   7 months ago Type 2 diabetes mellitus with chronic kidney disease, without long-term current use of insulin, unspecified CKD stage [facility])   [facility] at [place], Ranell Patrick, MD   7 months ago Medicare annual wellness visit, subsequent   [facility] at [place], Ranell Patrick, MD   10 months ago Type 2 diabetes mellitus with chronic kidney disease, without long-term current use of insulin, unspecified CKD stage [facility])   [facility] at [place], Ranell Patrick, MD      Future Appointments            In 2 weeks Wendie Agreste, MD Primary Care at [place], [facility]           . losartan (COZAAR) 100 MG tablet 90 tablet 1    Sig: Take 1 tablet (100 mg total) by mouth daily.     Cardiovascular:  Angiotensin Receptor  Blockers Failed - 08/21/2020 10:33 AM      Failed - Cr in normal range and within 180 days    Creat  Date Value Ref Range Status  06/29/2016 1.39 (H) 0.70 - 1.25 mg/dL Final    Comment:      For patients > or = 70 years of age: The upper reference limit  for Creatinine is approximately 13% higher for people identified as African-American.      Creatinine, Ser  Date Value Ref Range Status  07/19/2020 1.54 (H) 0.76 - 1.27 mg/dL Final         Passed - K in normal range and within 180 days    Potassium  Date Value Ref Range Status  07/19/2020 4.9 3.5 - 5.2 mmol/L Final         Passed - Patient is not pregnant      Passed - Last BP in normal range    BP Readings from Last 1 Encounters:  08/18/20 137/62         Passed - Valid encounter within last 6 months    Recent Outpatient Visits          1 week ago Type 2 diabetes mellitus with chronic kidney disease, without long-term current use of insulin, unspecified CKD stage [facility])   [facility] [place], Ranell Patrick, MD   1 month ago Type 2 diabetes mellitus with chronic kidney disease, without long-term current use of insulin, unspecified CKD stage [facility])   [facility] at [place], Ranell Patrick, MD   7 months ago Type 2 diabetes mellitus with chronic kidney disease, without long-term current use of insulin, unspecified CKD stage [facility])   [facility] at [place], Ranell Patrick, MD   7 months ago Medicare annual wellness visit, subsequent   [facility] at [place], Ranell Patrick, MD   10 months ago Type 2 diabetes mellitus with chronic kidney disease, without long-term current use of insulin, unspecified CKD stage [facility])   [facility] at [place], Ranell Patrick, MD      Future Appointments            In 2 weeks Wendie Agreste, MD Primary Care at [facility], [facility]           . metFORMIN (GLUCOPHAGE) 1000 MG tablet 180 tablet 1    Sig: Take 1 tablet (1,000 mg total) by mouth 2 (two) times daily with a meal.     Endocrinology:  Diabetes - Biguanides Failed - 08/21/2020 10:33 AM      Failed - Cr in normal range and within 360 days    Creat  Date Value Ref Range Status  06/29/2016 1.39 (H) 0.70 - 1.25 mg/dL Final    Comment:      For patients > or = 70 years of age:  The upper reference limit for Creatinine is approximately 13% higher for people identified as African-American.      Creatinine, Ser  Date Value Ref Range Status  07/19/2020 1.54 (H) 0.76 - 1.27 mg/dL Final         Failed - eGFR in normal range and within 360 days    GFR, Est African American  Date Value Ref Range Status  06/29/2016 61 >=60 mL/min Final   GFR calc Af Amer  Date Value Ref Range Status  07/19/2020 52 (L) >59 mL/min/1.73 Final    Comment:    **[facility] currently reports eGFR in compliance with the current**   recommendations of the  [employer]. [facility] will   update reporting as new guidelines are published from the NKF-ASN   Task force.    GFR, Est Non African American  Date Value Ref Range Status  06/29/2016 53 (L) >=60 mL/min Final   GFR calc non Af Amer  Date Value Ref Range Status  07/19/2020 45 (L) >59 mL/min/1.73 Final         Passed - HBA1C is between 0 and 7.9 and within 180 days    Hgb A1c MFr Bld  Date Value Ref Range Status  07/19/2020 7.2 (H) 4.8 - 5.6 % Final    Comment:             Prediabetes: 5.7 - 6.4          Diabetes: >6.4          Glycemic control for adults with diabetes: <7.0          Passed - Valid encounter within last 6 months    Recent Outpatient Visits          1 week ago Type 2 diabetes mellitus with chronic kidney disease, without long-term current use of insulin, unspecified CKD stage ([place])   [facility] [place] [facility], Ranell Patrick, MD   1 month ago Type 2 diabetes mellitus with chronic kidney disease, without long-term current use of insulin, unspecified CKD stage [facility])   [facility] at [place], Ranell Patrick, MD   7 months ago Type 2 diabetes mellitus with chronic kidney disease, without long-term current use of insulin, unspecified CKD stage [facility])   [facility] at [place], Ranell Patrick, MD   7 months ago Medicare annual wellness visit, subsequent   [facility] at [place], Ranell Patrick, MD    10 months ago Type 2 diabetes mellitus with chronic kidney disease, without long-term current use of insulin, unspecified CKD stage [facility])   [facility] at [place], Ranell Patrick, MD      Future Appointments            In 2 weeks Carlota Raspberry Ranell Patrick, MD Primary Care at [place], [facility]

## 2020-08-21 NOTE — Telephone Encounter (Signed)
Patient requesting 90 day supply amLODipine (NORVASC) 5 MG tablet , metFORMIN (GLUCOPHAGE) 1000 MG tablet , atorvastatin (LIPITOR) 20 MG tablet , losartan (COZAAR) 100 MG tablet ,hydrochlorothiazide (HYDRODIURIL) 25 MG tablet. Informed patient please allow 48 to 72 hour turn around time. Patient states he reached to pharmacy last week.     [facility] [address], [place] - [address] Phone:  [phone]  Fax:  [phone]

## 2020-08-30 ENCOUNTER — Telehealth: Payer: Self-pay | Admitting: Family Medicine

## 2020-08-30 NOTE — Telephone Encounter (Signed)
Called pt to see if they would like make there appt as a virtual with provider. Pt stated that he would like a in person appt and would like to sch whenhe can be seen in person. Please advise.

## 2020-09-05 ENCOUNTER — Encounter: Payer: Self-pay | Admitting: Family Medicine

## 2020-09-05 NOTE — Telephone Encounter (Signed)
Glucose readings

## 2020-09-07 ENCOUNTER — Ambulatory Visit: Payer: Medicare PPO | Admitting: Family Medicine

## 2020-09-07 DIAGNOSIS — H43811 Vitreous degeneration, right eye: Secondary | ICD-10-CM | POA: Diagnosis not present

## 2020-09-07 DIAGNOSIS — Z961 Presence of intraocular lens: Secondary | ICD-10-CM | POA: Diagnosis not present

## 2020-09-07 DIAGNOSIS — E119 Type 2 diabetes mellitus without complications: Secondary | ICD-10-CM | POA: Diagnosis not present

## 2020-09-07 DIAGNOSIS — H04123 Dry eye syndrome of bilateral lacrimal glands: Secondary | ICD-10-CM | POA: Diagnosis not present

## 2020-09-07 DIAGNOSIS — H26493 Other secondary cataract, bilateral: Secondary | ICD-10-CM | POA: Diagnosis not present

## 2020-09-07 LAB — HM DIABETES EYE EXAM

## 2020-09-08 ENCOUNTER — Encounter: Payer: Self-pay | Admitting: Neurology

## 2020-09-11 ENCOUNTER — Other Ambulatory Visit: Payer: Self-pay | Admitting: Diagnostic Neuroimaging

## 2020-10-13 ENCOUNTER — Other Ambulatory Visit: Payer: Self-pay

## 2020-10-13 ENCOUNTER — Encounter: Payer: Self-pay | Admitting: Family Medicine

## 2020-10-13 ENCOUNTER — Ambulatory Visit: Payer: Medicare PPO | Admitting: Family Medicine

## 2020-10-13 VITALS — BP 158/70 | HR 80 | Temp 98.3°F | Ht 69.0 in | Wt 200.8 lb

## 2020-10-13 DIAGNOSIS — M069 Rheumatoid arthritis, unspecified: Secondary | ICD-10-CM

## 2020-10-13 DIAGNOSIS — E1122 Type 2 diabetes mellitus with diabetic chronic kidney disease: Secondary | ICD-10-CM | POA: Diagnosis not present

## 2020-10-13 DIAGNOSIS — R0600 Dyspnea, unspecified: Secondary | ICD-10-CM | POA: Diagnosis not present

## 2020-10-13 DIAGNOSIS — G7000 Myasthenia gravis without (acute) exacerbation: Secondary | ICD-10-CM | POA: Diagnosis not present

## 2020-10-13 DIAGNOSIS — M791 Myalgia, unspecified site: Secondary | ICD-10-CM | POA: Diagnosis not present

## 2020-10-13 DIAGNOSIS — R609 Edema, unspecified: Secondary | ICD-10-CM | POA: Diagnosis not present

## 2020-10-13 DIAGNOSIS — R0609 Other forms of dyspnea: Secondary | ICD-10-CM

## 2020-10-13 MED ORDER — FOLIC ACID 1 MG PO TABS: 1.0000 mg | ORAL_TABLET | Freq: Every day | ORAL | 0 refills | Status: DC

## 2020-10-13 MED ORDER — METHOTREXATE 2.5 MG PO TABS: 10.0000 mg | ORAL_TABLET | ORAL | 0 refills | Status: DC

## 2020-10-13 MED ORDER — FUROSEMIDE 20 MG PO TABS: 20.0000 mg | ORAL_TABLET | Freq: Every day | ORAL | 1 refills | Status: DC

## 2020-10-13 NOTE — Progress Notes (Signed)
Subjective:  Patient ID: Jonathan Mcgee, male    DOB: [birth year]  Age: 69 y.o. MRN: [id number]  CC:  Chief Complaint  Patient presents with  . Edema    very concerned and frustrated about the lack of mobility and and swelling going on in his legs. Impairing ability to walk  . Referral    wants 2nd opinion on the ra doctor, current doctor is not treating the issues that he is having    HPI Jonathan Mcgee presents for   Leg pain, mobility issues. See previous visits, last discussed September 15.  History of myasthenia gravis, rheumatoid arthritis, neurologist Dr. Marjory Lies, rheumatology Dr. Nickola Major.  Has been treated with Mestinon and prednisone for MG, plan for possible IVIG with myasthenia gravis flare up.  Also plan for increasing prednisone doses, possible CellCept for long-term steroid sparing treatment.  Was continued on previous stick mean.  Close follow-up with diabetes recommended with higher dosing of prednisone.  He was on 30 mg daily at September 15 visit.  Did report some feeling of being more off balance in the morning but no falls, has used a cane.  Concerned about swelling he has noticed his legs.  He is on amlodipine 7.5 mg daily.  Now on 60mg  prednisone almost 2 months. Now completed IVIG. Upper leg discomfort improved as of a week ago, but comes and goes. Tx: tylenol arthritis.  Upper left arm muscle pain past 2 weeks - uses left arm to use cane.  Still taking lipitor daily.  Leg swelling in both legs Short of breath with walking distances for past year, tightness in chest, no pain. persistent symptoms - no apparent worsening.  EF 60-65% in April echo, trivial mitral regurgitation, mild aortic regurgitation, not thought to be clinically significant.  evaluated by cardiology in April for heart murmur.  Nuclear stress test EF 59% at that time.  Left ventricular ejection fraction 55 to 65%, no ST segment deviation during stress.  Low risk study. Hctz, no other current diuretic.    Has compression stockings - only wearing 2-3 days per week. Not on currently.   Lab Results  Component Value Date   K 4.9 07/19/2020    Diabetes: weight has remained the same since September. See my chart message, home blood sugar readings September 29 through October 12 average 137. Currently treated with Metformin 1000 mg twice daily, glipizide 2.5 milligrams every morning (takes if numbers running high - none in past few weeks.  He is on statin as well as ARB.  Lab Results  Component Value Date   HGBA1C 7.2 (H) 07/19/2020   Wt Readings from Last 3 Encounters:  10/13/20 200 lb 12.8 oz (91.1 kg)  08/18/20 200 lb (90.7 kg)  08/16/20 200 lb (90.7 kg)    Rheumatoid arthritis Current rheumatologist Dr. 08/18/20. History of rheumatoid arthritis, osteoarthritis and DISH.  Appointment with Dr. Nickola Major reviewed from August 30.  Methotrexate 4 tablets weekly.  Folic acid daily.  Noted on that visit that some of the leg swelling could be related to his prednisone.  Did not feel like additional dosage of methotrexate or other medications added at that time.   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  Option of trial discussed but he decided to remain on same dose of statin at that time.  He did try coming off of statin a few days previously without change in symptoms.  Option of statin holiday once he had reached maximal dose of prednisone and IVIG treatment if still having leg pain.  Ran out of methotrexate and folic acid about a month ago - variable doses prior but had been on 4 per week. Unable to get a refill.  Plans on seeing Dr. Nickola Major again (appt in December?)  but wouuld like to meet with different rheumatologist.       History Patient Active Problem List   Diagnosis Date Noted  . Rheumatoid arthritis (HCC) 09/23/2019  . Morbid obesity (HCC) 07/19/2019  . UTI (urinary tract infection)  02/20/2019  . Suspected COVID-19 virus infection 02/20/2019  . Type 2 diabetes mellitus (HCC) 02/20/2019  . Sepsis (HCC) 02/19/2019  . CKD (chronic kidney disease) 04/12/2017  . Essential hypertension 01/13/2017  . Hyperlipidemia 01/13/2017  . Myasthenia gravis (HCC) 08/28/2016   Past Medical History:  Diagnosis Date  . Arthritis   . Diabetes mellitus without complication (HCC)   . Hypercholesterolemia   . Hypertension   . Myasthenia gravis (HCC)   . Rheumatoid arthritis [facility])    Past Surgical History:  Procedure Laterality Date  . CATARACT EXTRACTION, BILATERAL  2020  . KNEE SURGERY Right    fractured patella > 20 yrs ago  . PENILE PROSTHESIS IMPLANT  2015   Allergies  Allergen Reactions  . Lisinopril Swelling    Facial swelling   Prior to Admission medications   Medication Sig Start Date End Date Taking? Authorizing Provider  amLODipine (NORVASC) 5 MG tablet TAKE 1 & 1/2 (ONE & ONE-HALF) TABLETS BY MOUTH ONCE DAILY 07/23/20  Yes Shade Flood, MD  atorvastatin (LIPITOR) 20 MG tablet Take 1 tablet (20 mg total) by mouth daily. 08/21/20  Yes Shade Flood, MD  folic acid (FOLVITE) 1 MG tablet    Yes [provider]  glipiZIDE (GLUCOTROL) 5 MG tablet Take 0.5 tablets (2.5 mg total) by mouth daily before breakfast. 01/19/20  Yes Shade Flood, MD  hydrochlorothiazide (HYDRODIURIL) 25 MG tablet Take 1 tablet (25 mg total) by mouth daily. 08/21/20  Yes Shade Flood, MD  losartan (COZAAR) 100 MG tablet Take 1 tablet (100 mg total) by mouth daily. 08/21/20  Yes Shade Flood, MD  metFORMIN (GLUCOPHAGE) 1000 MG tablet Take 1 tablet (1,000 mg total) by mouth 2 (two) times daily with a meal. 08/21/20  Yes Shade Flood, MD  Methotrexate 2.5 MG/ML SOLN  09/24/19  Yes [provider]  predniSONE (DELTASONE) 10 MG tablet Take 6 tablets (60 mg total) by mouth daily with breakfast. 07/24/20  Yes Penumalli, Glenford Bayley, MD  pyridostigmine (MESTINON) 60 MG  tablet TAKE 1 TABLET BY MOUTH THREE TIMES DAILY 09/11/20  Yes Penumalli, Glenford Bayley, MD   Social History   Socioeconomic History  . Marital status: Single    Spouse name: Not on file  . Number of children: 1  . Years of education: 23  . Highest education level: Not on file  Occupational History    Comment: [employer]  Tobacco Use  . Smoking status: Current Some Day Smoker    Packs/day: 1.00    Types: Cigars  . Smokeless tobacco: Never Used  Substance and Sexual Activity  . Alcohol use: Yes    Comment: occas  . Drug use: No  . Sexual activity: Not on file  Other Topics Concern  . Not on file  Social History Narrative   Drinks 2-3 caffeine drinks a day    Social Determinants of Health  Financial Resource Strain:   . Difficulty of Paying Living Expenses: Not on file  Food Insecurity:   . Worried About Programme researcher, broadcasting/film/video in the Last Year: Not on file  . Ran Out of Food in the Last Year: Not on file  Transportation Needs:   . Lack of Transportation (Medical): Not on file  . Lack of Transportation (Non-Medical): Not on file  Physical Activity:   . Days of Exercise per Week: Not on file  . Minutes of Exercise per Session: Not on file  Stress:   . Feeling of Stress : Not on file  Social Connections:   . Frequency of Communication with Friends and Family: Not on file  . Frequency of Social Gatherings with Friends and Family: Not on file  . Attends Religious Services: Not on file  . Active Member of Clubs or Organizations: Not on file  . Attends Banker Meetings: Not on file  . Marital Status: Not on file  Intimate Partner Violence:   . Fear of Current or Ex-Partner: Not on file  . Emotionally Abused: Not on file  . Physically Abused: Not on file  . Sexually Abused: Not on file    Review of Systems Per hpi  Objective:   Vitals:   10/13/20 1500  BP: (!) 158/70  Pulse: 80  Temp: 98.3 F (36.8 C)  SpO2: 96%  Weight: 200 lb 12.8 oz (91.1 kg)  Height: 5\' 9"   (1.753 m)     Physical Exam Vitals reviewed.  Constitutional:      Appearance: He is well-developed.  HENT:     Head: Normocephalic and atraumatic.  Eyes:     Pupils: Pupils are equal, round, and reactive to light.  Neck:     Vascular: No carotid bruit or JVD.  Cardiovascular:     Rate and Rhythm: Normal rate and regular rhythm.     Heart sounds: Murmur (2/6 to 3/6 systolic murmur.) heard.   Pulmonary:     Effort: Pulmonary effort is normal. No respiratory distress.     Breath sounds: Normal breath sounds. No wheezing or rales.  Musculoskeletal:     Right lower leg: Edema (2-3+ pitting edema mid to lower tibia bilaterally.  Skin intact, no erythema, no rash.  No wounds.) present.     Left lower leg: Edema present.     Comments: Left upper arm slight tenderness without focal bony tenderness.  Muscles are soft.  Skin:    General: Skin is warm and dry.  Neurological:     Mental Status: He is alert and oriented to person, place, and time.    45 minutes spent during visit, greater than 50% counseling and assimilation of information, chart review, and discussion of plan.    Assessment & Plan:  Jonathan Mcgee is a 69 y.o. male . Peripheral edema - Plan: Pro b natriuretic peptide, furosemide (LASIX) 20 MG tablet, Comprehensive metabolic panel  - prior EF ok. Prednisone may be contributing. Lasix 20mg  with potassium rich foods for now. Compression stockings recommended. 3 week follow up. RTC/ER precautions.   Myalgia - Plan: CK  - trial off statin, check CPK.   DOE (dyspnea on exertion) - Plan: Pro b natriuretic peptide  -check BNP, persistent sx's past year, Consider repeat cardiology eval if persistent, with RTC/ER precautions if any worsening.   Rheumatoid arthritis, involving unspecified site, unspecified whether rheumatoid factor present (HCC) - Plan: Ambulatory referral to Rheumatology, folic acid (FOLVITE) 1 MG tablet, Comprehensive metabolic panel,  methotrexate (RHEUMATREX)  2.5 MG tablet  - refilled methotrexate and folic acid until rheumatology follow up, check CMP. New rheumatology referral ordered.   MG (myasthenia gravis) (HCC)  - on prednisone - continue follow up with neuro.   Type 2 diabetes mellitus with chronic kidney disease, without long-term current use of insulin, unspecified CKD stage (HCC) - Plan: Hemoglobin A1c  - check A1c on higher dose prednisone, prior home readings ok.   Meds ordered this encounter  Medications  . folic acid (FOLVITE) 1 MG tablet    Sig: Take 1 tablet (1 mg total) by mouth daily.    Dispense:  30 tablet    Refill:  0  . furosemide (LASIX) 20 MG tablet    Sig: Take 1 tablet (20 mg total) by mouth daily.    Dispense:  30 tablet    Refill:  1  . methotrexate (RHEUMATREX) 2.5 MG tablet    Sig: Take 4 tablets (10 mg total) by mouth once a week. Caution:Chemotherapy. Protect from light.    Dispense:  16 tablet    Refill:  0   Patient Instructions   I recommend stopping the lipitor for next 2-3 weeks to see if the muscle aches improve.   I will check some labs today, but may need to meet with cardiology to discuss shortness of breath further. Lasix once per day for leg swelling. I do recommend compression stockings daily.  Potassium rich food each day such as banana or sweet potato can help keep your potassium levels up.  I placed a referral to a new rheumatologist but do recommend you follow-up with your current rheumatologist to see if any changes needed in the interim.  I did refill the methotrexate and folic acid for now until you are able to see rheumatology.  Recheck in 3 weeks, sooner if any new or worsening symptoms.  Return to the clinic or go to the nearest emergency room if any of your symptoms worsen or new symptoms occur.     If you have lab work done today you will be contacted with your lab results within the next 2 weeks.  If you have not heard from [place] then please contact us. The fastest way to get  your results is to register for My Chart.   IF you received an x-ray today, you will receive an invoice from [facility]. Please contact [facility] at [phone] with questions or concerns regarding your invoice.   IF you received labwork today, you will receive an invoice from [facility]. Please contact [facility] at [phone] with questions or concerns regarding your invoice.   Our billing staff will not be able to assist you with questions regarding bills from these companies.  You will be contacted with the lab results as soon as they are available. The fastest way to get your results is to activate your My Chart account. Instructions are located on the last page of this paperwork. If you have not heard from [place] regarding the results in 2 weeks, please contact this office.         Signed, [name], MD [facility]

## 2020-10-13 NOTE — Patient Instructions (Addendum)
I recommend stopping the lipitor for next 2-3 weeks to see if the muscle aches improve.   I will check some labs today, but may need to meet with cardiology to discuss shortness of breath further. Lasix once per day for leg swelling. I do recommend compression stockings daily.  Potassium rich food each day such as banana or sweet potato can help keep your potassium levels up.  I placed a referral to a new rheumatologist but do recommend you follow-up with your current rheumatologist to see if any changes needed in the interim.  I did refill the methotrexate and folic acid for now until you are able to see rheumatology.  Recheck in 3 weeks, sooner if any new or worsening symptoms.  Return to the clinic or go to the nearest emergency room if any of your symptoms worsen or new symptoms occur.     If you have lab work done today you will be contacted with your lab results within the next 2 weeks.  If you have not heard from [place] then please contact us. The fastest way to get your results is to register for My Chart.   IF you received an x-ray today, you will receive an invoice from [facility]. Please contact [facility] at [phone] with questions or concerns regarding your invoice.   IF you received labwork today, you will receive an invoice from [facility]. Please contact [facility] at [phone] with questions or concerns regarding your invoice.   Our billing staff will not be able to assist you with questions regarding bills from these companies.  You will be contacted with the lab results as soon as they are available. The fastest way to get your results is to activate your My Chart account. Instructions are located on the last page of this paperwork. If you have not heard from [place] regarding the results in 2 weeks, please contact this office.

## 2020-10-14 LAB — COMPREHENSIVE METABOLIC PANEL
ALT: 13 IU/L (ref 0–44)
AST: 11 IU/L (ref 0–40)
Albumin/Globulin Ratio: 1.9 (ref 1.2–2.2)
Albumin: 4.4 g/dL (ref 3.8–4.8)
Alkaline Phosphatase: 60 IU/L (ref 44–121)
BUN/Creatinine Ratio: 17 (ref 10–24)
BUN: 25 mg/dL (ref 8–27)
Bilirubin Total: 0.3 mg/dL (ref 0.0–1.2)
CO2: 21 mmol/L (ref 20–29)
Calcium: 9.2 mg/dL (ref 8.6–10.2)
Chloride: 99 mmol/L (ref 96–106)
Creatinine, Ser: 1.47 mg/dL — ABNORMAL HIGH (ref 0.76–1.27)
GFR calc Af Amer: 55 mL/min/{1.73_m2} — ABNORMAL LOW (ref >59)
GFR calc non Af Amer: 48 mL/min/{1.73_m2} — ABNORMAL LOW (ref >59)
Globulin, Total: 2.3 g/dL (ref 1.5–4.5)
Glucose: 346 mg/dL — ABNORMAL HIGH (ref 65–99)
Potassium: 4.6 mmol/L (ref 3.5–5.2)
Sodium: 140 mmol/L (ref 134–144)
Total Protein: 6.7 g/dL (ref 6.0–8.5)

## 2020-10-14 LAB — HEMOGLOBIN A1C
Est. average glucose Bld gHb Est-mCnc: 186 mg/dL
Hgb A1c MFr Bld: 8.1 % — ABNORMAL HIGH (ref 4.8–5.6)

## 2020-10-14 LAB — PRO B NATRIURETIC PEPTIDE: NT-Pro BNP: 113 pg/mL (ref 0–376)

## 2020-10-14 LAB — CK: Total CK: 67 U/L (ref 41–331)

## 2020-10-15 ENCOUNTER — Encounter: Payer: Self-pay | Admitting: Family Medicine

## 2020-10-23 ENCOUNTER — Other Ambulatory Visit: Payer: Self-pay | Admitting: Family Medicine

## 2020-10-23 DIAGNOSIS — I10 Essential (primary) hypertension: Secondary | ICD-10-CM

## 2020-10-31 DIAGNOSIS — M0579 Rheumatoid arthritis with rheumatoid factor of multiple sites without organ or systems involvement: Secondary | ICD-10-CM | POA: Diagnosis not present

## 2020-10-31 DIAGNOSIS — E663 Overweight: Secondary | ICD-10-CM | POA: Diagnosis not present

## 2020-10-31 DIAGNOSIS — M4810 Ankylosing hyperostosis [Forestier], site unspecified: Secondary | ICD-10-CM | POA: Diagnosis not present

## 2020-10-31 DIAGNOSIS — Z79899 Other long term (current) drug therapy: Secondary | ICD-10-CM | POA: Diagnosis not present

## 2020-10-31 DIAGNOSIS — Z6829 Body mass index (BMI) 29.0-29.9, adult: Secondary | ICD-10-CM | POA: Diagnosis not present

## 2020-10-31 DIAGNOSIS — G7000 Myasthenia gravis without (acute) exacerbation: Secondary | ICD-10-CM | POA: Diagnosis not present

## 2020-10-31 DIAGNOSIS — M150 Primary generalized (osteo)arthritis: Secondary | ICD-10-CM | POA: Diagnosis not present

## 2020-11-10 ENCOUNTER — Ambulatory Visit (HOSPITAL_COMMUNITY)
Admission: RE | Admit: 2020-11-10 | Discharge: 2020-11-10 | Disposition: A | Discharge status: Home / Self Care | Payer: Medicare PPO | Source: Ambulatory Visit | Attending: Family Medicine | Admitting: Family Medicine

## 2020-11-10 ENCOUNTER — Other Ambulatory Visit: Payer: Self-pay

## 2020-11-10 ENCOUNTER — Ambulatory Visit: Payer: Medicare PPO | Admitting: Family Medicine

## 2020-11-10 VITALS — BP 135/66 | HR 89 | Temp 98.3°F | Ht 69.0 in | Wt 197.0 lb

## 2020-11-10 DIAGNOSIS — E1122 Type 2 diabetes mellitus with diabetic chronic kidney disease: Secondary | ICD-10-CM

## 2020-11-10 DIAGNOSIS — G7000 Myasthenia gravis without (acute) exacerbation: Secondary | ICD-10-CM | POA: Diagnosis not present

## 2020-11-10 DIAGNOSIS — R0609 Other forms of dyspnea: Secondary | ICD-10-CM

## 2020-11-10 DIAGNOSIS — R0600 Dyspnea, unspecified: Secondary | ICD-10-CM | POA: Diagnosis not present

## 2020-11-10 DIAGNOSIS — M7989 Other specified soft tissue disorders: Secondary | ICD-10-CM | POA: Insufficient documentation

## 2020-11-10 DIAGNOSIS — R609 Edema, unspecified: Secondary | ICD-10-CM

## 2020-11-10 DIAGNOSIS — M069 Rheumatoid arthritis, unspecified: Secondary | ICD-10-CM | POA: Diagnosis not present

## 2020-11-10 NOTE — Patient Instructions (Addendum)
I will refer you to cardiology.  Keep follow up with ortho.  You may a component of depression or adjustment disorder with your current symptoms. See info below. Let me know if I can help, including meeting with a therapist or discussing medication that may also help your symptoms.  If blood sugars running over 200 - increase glipizide to full pill and let me know if that is not keeping the numbers down.  I will order ultrasound of calf, but if any new or worsening calf pains, chest pains or shortness of breath, go to the emergency room as we discussed. Return to the clinic or go to the nearest emergency room if any of your symptoms worsen or new symptoms occur.  Adjustment Disorder, Adult Adjustment disorder is a group of symptoms that can develop after a stressful life event, such as the loss of a job or serious physical illness. The symptoms can affect how you feel, think, and act. They may interfere with your relationships. Adjustment disorder increases your risk of suicide and substance abuse. If this disorder is not managed early, it can develop into a more serious condition, such as major depressive disorder or post-traumatic stress disorder. What are the causes? This condition happens when you have trouble recovering from or coping with a stressful life event. What increases the risk? You are more likely to develop this condition if:  You have had depression or anxiety.  You are being treated for a long-term (chronic) illness.  You are being treated for an illness that cannot be cured (terminal illness).  You have a family history of mental illness. What are the signs or symptoms? Symptoms of this condition include:  Extreme trouble doing daily tasks, such as going to work.  Sadness, depression, or crying spells.  Worrying a lot.  Loss of enjoyment.  Change in appetite or weight.  Feelings of loss or hopelessness.  Thoughts of suicide.  Anxiety, worry, or  nervousness.  Trouble sleeping.  Avoiding family and friends.  Fighting or vandalism.  Complaining of feeling sick without being ill.  Feeling dazed or disconnected.  Nightmares.  Trouble sleeping.  Irritability.  Reckless driving.  Poor work International aid/development worker.  Ignoring bills. Symptoms of this condition start within three months of the stressful event. They do not last more than six months, unless the stressful circumstances last longer. Normal grieving after the death of a loved one is not a symptom of this condition. How is this diagnosed? To diagnose this condition, your health care provider will ask about what has happened in your life and how it has affected you. He or she may also ask about your medical history and your use of medicines, alcohol, and other substances. Your health care provider may do a physical exam and order lab tests or other studies. You may be referred to a mental health specialist. How is this treated? Treatment options for this condition include:  Counseling or talk therapy. Talk therapy is usually provided by mental health specialists.  Medicines. Certain medicines may help with depression, anxiety, and sleep.  Support groups. These offer emotional support, advice, and guidance. They are made up of people who have had similar experiences.  Observation and time. This is sometimes called "watchful waiting." In this treatment, health care providers monitor your health and behavior without other treatment. Adjustment disorder sometimes gets better on its own with time. Follow these instructions at home:  Take over-the-counter and prescription medicines only as told by your health care provider.  Keep all follow-up visits as told by your health care provider. This is important. Contact a health care provider if:  Your symptoms do not improve in six months.  Your symptoms get worse. Get help right away if:  You have serious thoughts about hurting  yourself or someone else. If you ever feel like you may hurt yourself or others, or have thoughts about taking your own life, get help right away. You can go to your nearest emergency department or call:  Your local emergency services (911 in the [place]  A suicide crisis helpline, such as the National Suicide Prevention Lifeline at 6802877749. This is open 24 hours a day. Summary  Adjustment disorder is a group of symptoms that can develop after a stressful life event, such as the loss of a job or serious physical illness. The symptoms can affect how you feel, think, and act. They may interfere with your relationships.  Symptoms of this condition start within three months of the stressful event. They do not last more than six months, unless the stressful circumstances last longer.  Treatment may include talk therapy, medicines, participation in a support group, or observation to see if symptoms improve.  Contact your health care provider if your symptoms get worse or do not improve in six months.  If you ever feel like you may hurt yourself or others, or have thoughts about taking your own life, get help right away. This information is not intended to replace advice given to you by your health care provider. Make sure you discuss any questions you have with your health care provider. Document Revised: 10/24/2017 Document Reviewed: 01/10/2017 Elsevier Patient Education  [employer].    If you have lab work done today you will be contacted with your lab results within the next 2 weeks.  If you have not heard from [place] then please contact us. The fastest way to get your results is to register for My Chart.   IF you received an x-ray today, you will receive an invoice from [facility]. Please contact [facility] at [phone] with questions or concerns regarding your invoice.   IF you received labwork today, you will receive an invoice from [facility]. Please contact  [facility] at [phone] with questions or concerns regarding your invoice.   Our billing staff will not be able to assist you with questions regarding bills from these companies.  You will be contacted with the lab results as soon as they are available. The fastest way to get your results is to activate your My Chart account. Instructions are located on the last page of this paperwork. If you have not heard from [place] regarding the results in 2 weeks, please contact this office.

## 2020-11-10 NOTE — Progress Notes (Signed)
Subjective:  Patient ID: Jonathan Mcgee, male    DOB: 03-Feb-1951  Age: 69 y.o. MRN: [id number]  CC:  Chief Complaint  Patient presents with  . Follow-up    On edema in his legs. Pt reports swelling has gone down a little, but not much feet ar still swollen. Pt is concerned that his L leg has gotten smaller, but still swollen. Pt has some updates about his other providers to share with the provider today. Pt states no one has reached out to him about referrals from last OV. Pt reports a lack of interest in anything due to his physical condition. Pt doesn't think this is depression related.   Marland Kitchen FYI    Pt is scheduled to see ortho on 11/14/2020 due to last Visit with rheumatology. They did x-rays ant the pt was referred to ortho. Pt states they told him that that they found bone spears and arthritis in the pt's R leg and his shoulder. Pt is concerned that the rheumatologist is treating him for RA, but no RA symptoms.  . Medication Refill    Pt states the Amlodipine he has been getting is really hard to cut in half even with a pill cutter. Pt is wondering if we can send in an alternat medication that might be easier to cut.    HPI Jonathan Mcgee presents for   Follow-up, see previous visit.  November 19.  Peripheral edema: Previous EF 60 to 65% on echocardiogram in April., thought to be part related to prednisone, as on high-dose prednisone with his myasthenia gravis.  Prescribed Lasix 20 mg daily at last visit, proBNP okay.   Has worn compression stockings.  Also on amlodipine.  Edema was discussed with his rheumatologist at December 7 visit, note reviewed. He did note some dyspnea on exertion at last visit, option of cardiology follow-up discussed. Swelling improved some with lasix, not resolved. Wearing compression stockings consistently since last visit.  Still short of breath with exertion, but not worse or better. Saw Dr. Eden Emms.  Feels like left leg is larger than R leg since last visit.  No calf pain. No new chest pain. No recent travel, no prior DVT.   Right hip pain, right shoulder pain Discussed with rheumatology, note from December 7 indicated x-ray to rule out AVN in the hip.  Symptoms were not thought to be due to rheumatoid arthritis due to current high-dose prednisone.  Methotrexate was discontinued as well as folic acid due to current dose of prednisone.  He was referred to orthopedics and has appointment December 21. Dr. Roda Shutters.  Reportedly was told he has some signs of arthritis based on imaging through rheumatology. New rheumatology referral placed last visit at his request - has not yet heard. Referral note reviewed - not accepted by initial referral. Message sent to referral coordinator.   Diabetes: A1c increased from 7.2-8.1 at last visit, on higher dose prednisone for myasthenia gravis under the care of neurology.  Restarted glipizide once per day 2.5 mg.  Continued Metformin 1000mg  bid. Creatinine 1.47 last visit improved from previous readings in August 2020, February 2021 and August of this year. Home readings 139-142. Rare spike in September -with certain foods.  Has made some positive changes in diet since last A1c.  Decreased motivation, ambition with his current illness. Not wanting to do laundry. Not getting things done. Present over 9 months to a year.  Has not talked to therapist - not sure he wants to do this. Declines meds  for current symptoms.   Depression screen [facility] 2/9 01/19/2020 12/30/2019 10/18/2019 08/20/2019 07/22/2019  Decreased Interest 0 0 0 0 0  Down, Depressed, Hopeless 0 0 0 0 0  PHQ - 2 Score 0 0 0 0 0   Lab Results  Component Value Date   HGBA1C 8.1 (H) 10/13/2020   HGBA1C 7.2 (H) 07/19/2020   HGBA1C 5.9 (H) 01/19/2020   Lab Results  Component Value Date   LDLCALC 70 07/19/2020   CREATININE 1.70 (H) 11/10/2020     History Patient Active Problem List   Diagnosis Date Noted  . Rheumatoid arthritis (HCC) 09/23/2019  . Morbid obesity (HCC)  07/19/2019  . UTI (urinary tract infection) 02/20/2019  . Suspected COVID-19 virus infection 02/20/2019  . Type 2 diabetes mellitus (HCC) 02/20/2019  . Sepsis (HCC) 02/19/2019  . CKD (chronic kidney disease) 04/12/2017  . Essential hypertension 01/13/2017  . Hyperlipidemia 01/13/2017  . Myasthenia gravis (HCC) 08/28/2016   Past Medical History:  Diagnosis Date  . Arthritis   . Diabetes mellitus without complication (HCC)   . Hypercholesterolemia   . Hypertension   . Myasthenia gravis (HCC)   . Rheumatoid arthritis [facility])    Past Surgical History:  Procedure Laterality Date  . CATARACT EXTRACTION, BILATERAL  2020  . KNEE SURGERY Right    fractured patella > 20 yrs ago  . PENILE PROSTHESIS IMPLANT  2015   Allergies  Allergen Reactions  . Lisinopril Swelling    Facial swelling   Prior to Admission medications   Medication Sig Start Date End Date Taking? Authorizing Provider  atorvastatin (LIPITOR) 20 MG tablet Take 1 tablet (20 mg total) by mouth daily. 08/21/20  Yes Shade Flood, MD  folic acid (FOLVITE) 1 MG tablet Take 1 tablet (1 mg total) by mouth daily. 10/13/20  Yes Shade Flood, MD  furosemide (LASIX) 20 MG tablet Take 1 tablet (20 mg total) by mouth daily. 10/13/20  Yes Shade Flood, MD  glipiZIDE (GLUCOTROL) 5 MG tablet Take 0.5 tablets (2.5 mg total) by mouth daily before breakfast. 01/19/20  Yes Shade Flood, MD  hydrochlorothiazide (HYDRODIURIL) 25 MG tablet Take 1 tablet (25 mg total) by mouth daily. 08/21/20  Yes Shade Flood, MD  losartan (COZAAR) 100 MG tablet Take 1 tablet (100 mg total) by mouth daily. 08/21/20  Yes Shade Flood, MD  metFORMIN (GLUCOPHAGE) 1000 MG tablet Take 1 tablet (1,000 mg total) by mouth 2 (two) times daily with a meal. 08/21/20  Yes Shade Flood, MD  predniSONE (DELTASONE) 10 MG tablet Take 6 tablets (60 mg total) by mouth daily with breakfast. 07/24/20  Yes Penumalli, Vikram R, MD  pyridostigmine (MESTINON)  60 MG tablet TAKE 1 TABLET BY MOUTH THREE TIMES DAILY 09/11/20  Yes Penumalli, Glenford Bayley, MD  amLODipine (NORVASC) 5 MG tablet TAKE 1 & 1/2 (ONE & ONE-HALF) TABLETS BY MOUTH ONCE DAILY 10/23/20   Shade Flood, MD  Methotrexate 2.5 MG/ML SOLN  09/24/19   [provider]   Social History   Socioeconomic History  . Marital status: Single    Spouse name: Not on file  . Number of children: 1  . Years of education: 34  . Highest education level: Not on file  Occupational History    Comment: [employer]  Tobacco Use  . Smoking status: Current Some Day Smoker    Packs/day: 1.00    Types: Cigars  . Smokeless tobacco: Never Used  Substance and Sexual Activity  .  Alcohol use: Yes    Comment: occas  . Drug use: No  . Sexual activity: Not on file  Other Topics Concern  . Not on file  Social History Narrative   Drinks 2-3 caffeine drinks a day    Social Determinants of Health   Financial Resource Strain: Not on file  Food Insecurity: Not on file  Transportation Needs: Not on file  Physical Activity: Not on file  Stress: Not on file  Social Connections: Not on file  Intimate Partner Violence: Not on file    Review of Systems   Objective:   Vitals:   11/10/20 1527  BP: 135/66  Pulse: 89  Temp: 98.3 F (36.8 C)  TempSrc: Temporal  SpO2: 95%  Weight: 197 lb (89.4 kg)  Height: 5\' 9"  (1.753 m)     Physical Exam Vitals reviewed.  Constitutional:      Appearance: He is well-developed and well-nourished.  HENT:     Head: Normocephalic and atraumatic.  Eyes:     Extraocular Movements: EOM normal.     Pupils: Pupils are equal, round, and reactive to light.  Neck:     Vascular: No carotid bruit or JVD.  Cardiovascular:     Rate and Rhythm: Normal rate and regular rhythm.     Heart sounds: No murmur heard. No gallop.   Pulmonary:     Effort: Pulmonary effort is normal.     Breath sounds: Normal breath sounds. No rales.  Musculoskeletal:     Right lower leg: Edema  (1+ pitting bilat at lower 1/3 tibia. ) present.     Left lower leg: Edema (left calf 39cm vs 36cm on right measured at 15cm below patella. calf nontender, neg homans. ) present.  Skin:    General: Skin is warm and dry.  Neurological:     Mental Status: He is alert and oriented to person, place, and time.  Psychiatric:        Mood and Affect: Mood and affect normal.      47 minutes spent during visit, greater than 50% counseling and assimilation of information, chart review, and discussion of plan.   Assessment & Plan:  Jonathan Mcgee is a 69 y.o. male . Peripheral edema - Plan: Ambulatory referral to Cardiology, Basic metabolic panel  -Some improvement with compression stockings.  BNP reassuring previously.  Can try slight lower dose of amlodipine, monitor home BPs and option of additional medication or trial off amlodipine if persistent edema.  Follow-up with cardiology.  DOE (dyspnea on exertion) - Plan: Ambulatory referral to Cardiology  -May be related to his other concurrent illnesses, but will refer to cardiology.  ER precautions if acute worsening.  Calf swelling - Plan: VAS 66 LOWER EXTREMITY VENOUS (DVT)  -Ultrasound without sign of DVT.  Continue compression stockings, peripheral edema treatment as above.  Type 2 diabetes mellitus with chronic kidney disease, without long-term current use of insulin, unspecified CKD stage (HCC)  -Option of full dosing of glipizide if elevated readings.  MG (myasthenia gravis) (HCC)  -High-dose prednisone currently, followed by neurology.  Continue routine follow-up  Rheumatoid arthritis, involving unspecified site, unspecified whether rheumatoid factor present [facility])  -Referral to rheumatology ordered last visit, will check with referrals as appears initial referral was declined.  Option to restart statin if no change in myalgias.  No orders of the defined types were placed in this encounter.  Patient Instructions    I will refer you  to cardiology.  Keep follow up with  ortho.  You may a component of depression or adjustment disorder with your current symptoms. See info below. Let me know if I can help, including meeting with a therapist or discussing medication that may also help your symptoms.  If blood sugars running over 200 - increase glipizide to full pill and let me know if that is not keeping the numbers down.  I will order ultrasound of calf, but if any new or worsening calf pains, chest pains or shortness of breath, go to the emergency room as we discussed. Return to the clinic or go to the nearest emergency room if any of your symptoms worsen or new symptoms occur.  Adjustment Disorder, Adult Adjustment disorder is a group of symptoms that can develop after a stressful life event, such as the loss of a job or serious physical illness. The symptoms can affect how you feel, think, and act. They may interfere with your relationships. Adjustment disorder increases your risk of suicide and substance abuse. If this disorder is not managed early, it can develop into a more serious condition, such as major depressive disorder or post-traumatic stress disorder. What are the causes? This condition happens when you have trouble recovering from or coping with a stressful life event. What increases the risk? You are more likely to develop this condition if:  You have had depression or anxiety.  You are being treated for a long-term (chronic) illness.  You are being treated for an illness that cannot be cured (terminal illness).  You have a family history of mental illness. What are the signs or symptoms? Symptoms of this condition include:  Extreme trouble doing daily tasks, such as going to work.  Sadness, depression, or crying spells.  Worrying a lot.  Loss of enjoyment.  Change in appetite or weight.  Feelings of loss or hopelessness.  Thoughts of suicide.  Anxiety, worry, or nervousness.  Trouble  sleeping.  Avoiding family and friends.  Fighting or vandalism.  Complaining of feeling sick without being ill.  Feeling dazed or disconnected.  Nightmares.  Trouble sleeping.  Irritability.  Reckless driving.  Poor work International aid/development worker.  Ignoring bills. Symptoms of this condition start within three months of the stressful event. They do not last more than six months, unless the stressful circumstances last longer. Normal grieving after the death of a loved one is not a symptom of this condition. How is this diagnosed? To diagnose this condition, your health care provider will ask about what has happened in your life and how it has affected you. He or she may also ask about your medical history and your use of medicines, alcohol, and other substances. Your health care provider may do a physical exam and order lab tests or other studies. You may be referred to a mental health specialist. How is this treated? Treatment options for this condition include:  Counseling or talk therapy. Talk therapy is usually provided by mental health specialists.  Medicines. Certain medicines may help with depression, anxiety, and sleep.  Support groups. These offer emotional support, advice, and guidance. They are made up of people who have had similar experiences.  Observation and time. This is sometimes called "watchful waiting." In this treatment, health care providers monitor your health and behavior without other treatment. Adjustment disorder sometimes gets better on its own with time. Follow these instructions at home:  Take over-the-counter and prescription medicines only as told by your health care provider.  Keep all follow-up visits as told by your health care  provider. This is important. Contact a health care provider if:  Your symptoms do not improve in six months.  Your symptoms get worse. Get help right away if:  You have serious thoughts about hurting yourself or someone  else. If you ever feel like you may hurt yourself or others, or have thoughts about taking your own life, get help right away. You can go to your nearest emergency department or call:  Your local emergency services (911 in the [place]  A suicide crisis helpline, such as the National Suicide Prevention Lifeline at (657)278-2536. This is open 24 hours a day. Summary  Adjustment disorder is a group of symptoms that can develop after a stressful life event, such as the loss of a job or serious physical illness. The symptoms can affect how you feel, think, and act. They may interfere with your relationships.  Symptoms of this condition start within three months of the stressful event. They do not last more than six months, unless the stressful circumstances last longer.  Treatment may include talk therapy, medicines, participation in a support group, or observation to see if symptoms improve.  Contact your health care provider if your symptoms get worse or do not improve in six months.  If you ever feel like you may hurt yourself or others, or have thoughts about taking your own life, get help right away. This information is not intended to replace advice given to you by your health care provider. Make sure you discuss any questions you have with your health care provider. Document Revised: 10/24/2017 Document Reviewed: 01/10/2017 Elsevier Patient Education  [employer].    If you have lab work done today you will be contacted with your lab results within the next 2 weeks.  If you have not heard from [place] then please contact us. The fastest way to get your results is to register for My Chart.   IF you received an x-ray today, you will receive an invoice from [facility]. Please contact [facility] at [phone] with questions or concerns regarding your invoice.   IF you received labwork today, you will receive an invoice from [place]. Please contact [facility] at  [phone] with questions or concerns regarding your invoice.   Our billing staff will not be able to assist you with questions regarding bills from these companies.  You will be contacted with the lab results as soon as they are available. The fastest way to get your results is to activate your My Chart account. Instructions are located on the last page of this paperwork. If you have not heard from [place] regarding the results in 2 weeks, please contact this office.         Signed, [name], MD [facility]

## 2020-11-10 NOTE — Progress Notes (Signed)
Left lower extremity venous duplex has been completed. Preliminary results can be found in CV Proc through chart review.  Results were given to Dr. Neva Seat.  11/10/20 6:10 PM Olen Cordial RVT

## 2020-11-11 LAB — BASIC METABOLIC PANEL
BUN/Creatinine Ratio: 18 (ref 10–24)
BUN: 31 mg/dL — ABNORMAL HIGH (ref 8–27)
CO2: 23 mmol/L (ref 20–29)
Calcium: 9.4 mg/dL (ref 8.6–10.2)
Chloride: 99 mmol/L (ref 96–106)
Creatinine, Ser: 1.7 mg/dL — ABNORMAL HIGH (ref 0.76–1.27)
GFR calc Af Amer: 46 mL/min/{1.73_m2} — ABNORMAL LOW (ref >59)
GFR calc non Af Amer: 40 mL/min/{1.73_m2} — ABNORMAL LOW (ref >59)
Glucose: 162 mg/dL — ABNORMAL HIGH (ref 65–99)
Potassium: 4.3 mmol/L (ref 3.5–5.2)
Sodium: 139 mmol/L (ref 134–144)

## 2020-11-13 ENCOUNTER — Encounter: Payer: Self-pay | Admitting: Family Medicine

## 2020-11-14 ENCOUNTER — Ambulatory Visit: Payer: Medicare PPO | Admitting: Orthopaedic Surgery

## 2020-11-14 ENCOUNTER — Ambulatory Visit (INDEPENDENT_AMBULATORY_CARE_PROVIDER_SITE_OTHER): Payer: Medicare PPO

## 2020-11-14 ENCOUNTER — Encounter: Payer: Self-pay | Admitting: Family Medicine

## 2020-11-14 ENCOUNTER — Other Ambulatory Visit: Payer: Self-pay

## 2020-11-14 VITALS — Ht 69.0 in | Wt 197.0 lb

## 2020-11-14 DIAGNOSIS — G8929 Other chronic pain: Secondary | ICD-10-CM

## 2020-11-14 DIAGNOSIS — M1611 Unilateral primary osteoarthritis, right hip: Secondary | ICD-10-CM | POA: Diagnosis not present

## 2020-11-14 DIAGNOSIS — M25512 Pain in left shoulder: Secondary | ICD-10-CM

## 2020-11-14 DIAGNOSIS — M542 Cervicalgia: Secondary | ICD-10-CM

## 2020-11-14 NOTE — Progress Notes (Signed)
Office Visit Note   Patient: Jonathan Mcgee           Date of Birth: 1951-01-24           MRN: [id number] Visit Date: 11/14/2020              Requested by: Shade Flood, MD [address] [place],  [place] [address] PCP: Shade Flood, MD   Assessment & Plan: Visit Diagnoses:  1. Primary osteoarthritis of right hip   2. Chronic left shoulder pain   3. Neck pain     Plan: Impression is advanced degenerative joint disease to the right hip and left upper extremity radiculopathy versus less likely shoulder pathology.  In regards to the right hip, we have discussed cortisone injection and the likelihood of this providing long-lasting relief of symptoms versus surgical intervention to include total hip arthroplasty.  His most recent A1c was 8.1 which we discussed would need to be better before we can proceed with surgery.  He will follow up with Dr. Prince Rome for ultrasound-guided right hip injection while he is thinking about surgical intervention.  In regards to the left shoulder, he is already on steroids and will let us know if his symptoms progress or do not improve we will look into this further.  He will follow up with [place] as needed.  Follow-Up Instructions: Return for needs appt with Dr.Hilts for right hip joint injection.   Orders:  Orders Placed This Encounter  Procedures  . XR HIP UNILAT W OR W/O PELVIS 1V RIGHT  . XR Shoulder Left  . XR Cervical Spine 2 or 3 views   No orders of the defined types were placed in this encounter.     Procedures: No procedures performed   Clinical Data: No additional findings.   Subjective: Chief Complaint  Patient presents with  . Right Hip - Pain    HPI patient is a very pleasant 69 year old gentleman who comes in today with complaints of right hip and left shoulder pain.  In regards to his right hip, he notes discomfort to the lateral hip and occasionally into the groin for the past several months.  No known injury.  Walking  more than a few feet seems to aggravate his symptoms.  He denies any numbness, tingling or burning.  No previous cortisone injection to the right hip.  He does have underlying myasthenia gravis and is on high-dose steroids chronically for this.  In regards to his left shoulder, he was getting in his car about 3 months ago when he put his left arm on top trying to balance himself and all of a sudden felt a burn to the anterior shoulder.  He has intermittent burning to this area.  No actual pain.  He does note occasional tingling into his fingers as well.  He denies any weakness.  He does note that the symptoms are aggravated when lifting a half a gallon of milk.  Review of Systems as detailed in HPI.  All others reviewed and are negative.   Objective: Vital Signs: Ht 5\' 9"  (1.753 m)   Wt 197 lb (89.4 kg)   BMI 29.09 kg/m   Physical Exam well-developed well-nourished gentleman in no acute distress.  Alert oriented x3.  Ortho Exam right hip exam she has positive logroll and positive FADIR.  He does have tenderness to the greater trochanter.  Left shoulder exam shows full active range of motion without pain.  Negative empty can and cross body adduction.  No tenderness to the [facility] joint.  Full strength throughout.  He is neurovascular intact distally.  He does have stiffness with range of motion of the neck.  No focal weakness. He has 2+ pitting edema bilaterally.  No venous stasis skin changes.  Specialty Comments:  No specialty comments available.  Imaging: XR HIP UNILAT W OR W/O PELVIS 1V RIGHT  Result Date: 11/14/2020 Significant degenerative changes to the right hip  XR Cervical Spine 2 or 3 views  Result Date: 11/14/2020 Significant multilevel degenerative changes with anterior spurring  XR Shoulder Left  Result Date: 11/14/2020 X-rays demonstrate [facility] degenerative changes.      PMFS History: Patient Active Problem List   Diagnosis Date Noted  . Primary osteoarthritis of right  hip 11/14/2020  . Rheumatoid arthritis (HCC) 09/23/2019  . Morbid obesity (HCC) 07/19/2019  . UTI (urinary tract infection) 02/20/2019  . Suspected COVID-19 virus infection 02/20/2019  . Type 2 diabetes mellitus (HCC) 02/20/2019  . Sepsis (HCC) 02/19/2019  . CKD (chronic kidney disease) 04/12/2017  . Essential hypertension 01/13/2017  . Hyperlipidemia 01/13/2017  . Myasthenia gravis (HCC) 08/28/2016   Past Medical History:  Diagnosis Date  . Arthritis   . Diabetes mellitus without complication (HCC)   . Hypercholesterolemia   . Hypertension   . Myasthenia gravis (HCC)   . Rheumatoid arthritis (HCC)     Family History  Problem Relation Age of Onset  . Diabetes Mother   . Heart disease Mother   . Hyperlipidemia Mother   . Heart attack Mother   . Hyperlipidemia Father   . Kidney failure Father   . Drug abuse Maternal Grandmother   . Heart disease Maternal Grandmother   . Hypertension Maternal Grandmother   . Hyperlipidemia Maternal Grandmother   . Diabetes Maternal Grandmother     Past Surgical History:  Procedure Laterality Date  . CATARACT EXTRACTION, BILATERAL  2020  . KNEE SURGERY Right    fractured patella > 20 yrs ago  . PENILE PROSTHESIS IMPLANT  2015   Social History   Occupational History    Comment: [employer]  Tobacco Use  . Smoking status: Current Some Day Smoker    Packs/day: 1.00    Types: Cigars  . Smokeless tobacco: Never Used  Substance and Sexual Activity  . Alcohol use: Yes    Comment: occas  . Drug use: No  . Sexual activity: Not on file

## 2020-11-14 NOTE — Addendum Note (Signed)
Addended by: [name] R on: 11/14/2020 09:17 AM   Modules accepted: Orders

## 2020-11-15 ENCOUNTER — Ambulatory Visit (INDEPENDENT_AMBULATORY_CARE_PROVIDER_SITE_OTHER): Payer: Medicare PPO | Admitting: Family Medicine

## 2020-11-15 ENCOUNTER — Ambulatory Visit: Payer: Self-pay

## 2020-11-15 DIAGNOSIS — M1611 Unilateral primary osteoarthritis, right hip: Secondary | ICD-10-CM

## 2020-11-15 NOTE — Progress Notes (Signed)
Subjective: Patient is here for ultrasound-guided intra-articular right hip injection.   Pain from DJD.  Objective:  Pain and decreased ROM with IR.  Procedure: Ultrasound guided injection is preferred based studies that show increased duration, increased effect, greater accuracy, decreased procedural pain, increased response rate, and decreased cost with ultrasound guided versus blind injection.   Verbal informed consent obtained.  Time-out conducted.  Noted no overlying erythema, induration, or other signs of local infection. Ultrasound-guided right hip injection: After sterile prep with Betadine, injected 4 cc 0.25% bupivacaine without epinephrine and 6 mg betamethasone using a 22-gauge spinal needle, passing the needle through the iliofemoral ligament into the femoral head/neck junction.  Injectate seen filling joint capsule.  Good immediate relief.

## 2020-11-16 ENCOUNTER — Encounter: Payer: Self-pay | Admitting: Neurology

## 2020-11-16 ENCOUNTER — Other Ambulatory Visit: Payer: Self-pay

## 2020-11-16 ENCOUNTER — Ambulatory Visit: Payer: Medicare PPO | Admitting: Neurology

## 2020-11-16 VITALS — BP 144/64 | HR 80 | Resp 20 | Ht 68.0 in | Wt 200.0 lb

## 2020-11-16 DIAGNOSIS — G7000 Myasthenia gravis without (acute) exacerbation: Secondary | ICD-10-CM

## 2020-11-16 NOTE — Progress Notes (Signed)
[facility] Clinic Note - Initial Visit   Date: 11/16/20  Jonathan Mcgee MRN: [id number] DOB: 04-19-1951   Dear Dr. Neva Seat:  Thank you for your kind referral of Jonathan Mcgee for consultation of myasthenia gravis. Although his history is well known to you, please allow [place] to reiterate it for the purpose of our medical record. The patient was accompanied to the clinic by self.  History of Present Illness: Jonathan Mcgee is a 69 y.o. left-handed Jonathan Mcgee with diabetes mellitus, hypertension, hyperlipidemia, and rheumatoid arthritis referred for second opinion for myasthenia gravis.  He is a longtime patient of Dr. Marjory Lies and intends to follow-up with him in February, but wanted to hear my opinion about his myasthenia.  Patient was diagnosed with seropositive myasthenia gravis in 2017 after presenting with double vision, mild droopiness of the eyes, and mild slurred speech.  He was started on mestinon 60mg  three times daily and has been on prednisone 20mg /d.  He has never been hospitalized with MG crisis. He was hospitalized due to infection after starting azathioprine in 2020.  Due to leg weakness, he was managed with induction dose of IVIG in 2020 and again in the fall of 2021.  It is not clear whether he has a significant benefit after IVIG.  In the fall of 2021, he began having leg weakness, imbalance, difficulty with walking.  For this reason, he was given a course of IVIG and prednisone was increased to 60mg /d.  He remains on prednisone 60mg /d for the past several months.  He complaints of swelling and poorly controlled blood sugar.    Of note, patient also has rheumatoid arthritis and takes methotexate for this prescribed by rheumatology (Dr. 2022).  He is also seeing orthopeadics for right hip pain and received steroid injection yesterday.   Patient is frustrated today and expresses his grievances with lack of sufficient time and collaborate planning with  all his providers.  I tried to explain that this is not a provider-specific issue, but his frustration with the health system.   Out-side paper records, electronic medical record, and images have been reviewed where available and summarized as:  CT chest w contrast 09/04/2016: 1. No mediastinal mass or adenopathy. 2. No infiltrate or pulmonary edema. 3. Degenerative changes thoracic spine. 4. Atherosclerotic calcifications of thoracic aorta. 5. Mild fatty infiltration of the liver.   Labs 07/27/2016:  AChR binding 22.9*, blocking 32*, modulating 86* Lab Results  Component Value Date   HGBA1C 8.1 (H) 10/13/2020   No results found for: Nickola Major Lab Results  Component Value Date   TSH 3.650 10/24/2018   Lab Results  Component Value Date   ESRSEDRATE 107 (H) 02/20/2019    Past Medical History:  Diagnosis Date  . Arthritis   . Diabetes mellitus without complication (HCC)   . Hypercholesterolemia   . Hypertension   . Myasthenia gravis (HCC)   . Rheumatoid arthritis [facility])     Past Surgical History:  Procedure Laterality Date  . CATARACT EXTRACTION, BILATERAL  2020  . KNEE SURGERY Right    fractured patella > 20 yrs ago  . PENILE PROSTHESIS IMPLANT  2015     Medications:  Outpatient Encounter Medications as of 11/16/2020  Medication Sig  . amLODipine (NORVASC) 5 MG tablet TAKE 1 & 1/2 (ONE & ONE-HALF) TABLETS BY MOUTH ONCE DAILY  . atorvastatin (LIPITOR) 20 MG tablet Take 1 tablet (20 mg total) by mouth daily.  . furosemide (LASIX) 20 MG tablet Take 1 tablet (20  mg total) by mouth daily.  Marland Kitchen glipiZIDE (GLUCOTROL) 5 MG tablet Take 0.5 tablets (2.5 mg total) by mouth daily before breakfast.  . hydrochlorothiazide (HYDRODIURIL) 25 MG tablet Take 1 tablet (25 mg total) by mouth daily.  Marland Kitchen losartan (COZAAR) 100 MG tablet Take 1 tablet (100 mg total) by mouth daily.  . metFORMIN (GLUCOPHAGE) 1000 MG tablet Take 1 tablet (1,000 mg total) by mouth 2 (two) times daily with a meal.   . predniSONE (DELTASONE) 10 MG tablet Take 6 tablets (60 mg total) by mouth daily with breakfast.  . pyridostigmine (MESTINON) 60 MG tablet TAKE 1 TABLET BY MOUTH THREE TIMES DAILY  . folic acid (FOLVITE) 1 MG tablet Take 1 tablet (1 mg total) by mouth daily. (Patient not taking: Reported on 11/16/2020)   No facility-administered encounter medications on file as of 11/16/2020.    Allergies:  Allergies  Allergen Reactions  . Lisinopril Swelling    Facial swelling    Family History: Family History  Problem Relation Age of Onset  . Diabetes Mother   . Heart disease Mother   . Hyperlipidemia Mother   . Heart attack Mother   . Hyperlipidemia Father   . Kidney failure Father   . Drug abuse Maternal Grandmother   . Heart disease Maternal Grandmother   . Hypertension Maternal Grandmother   . Hyperlipidemia Maternal Grandmother   . Diabetes Maternal Grandmother     Social History: Social History   Tobacco Use  . Smoking status: Current Some Day Smoker    Packs/day: 1.00    Types: Cigars  . Smokeless tobacco: Never Used  Substance Use Topics  . Alcohol use: Yes    Comment: occas  . Drug use: No   Social History   Social History Narrative   Drinks 2-3 caffeine drinks a day    Left handed   One story     Vital Signs:  BP (!) 144/64   Pulse 80   Resp 20   Ht 5\' 8"  (1.727 m)   Wt 200 lb (90.7 kg)   SpO2 95%   BMI 30.41 kg/m   Neurological Exam: MENTAL STATUS including orientation to time, place, person, recent and remote memory, attention span and concentration, language, and fund of knowledge is normal.  Speech is not dysarthric.  CRANIAL NERVES: II:  No visual field defects.     III-IV-VI: Pupils equal round and reactive to light.  Normal conjugate, extra-ocular eye movements in all directions of gaze.  No nystagmus.  Mild right ptosis at baseline and worsening with sustained upgaze.   V:  Normal facial sensation.    VII:  Normal facial symmetry and  movements.  Orbicularis oculi, orbicularis oris, buccinator is 5/5 bilaterally.   VIII:  Normal hearing and vestibular function.   IX-X:  Normal palatal movement.   XI:  Normal shoulder shrug and head rotation.   XII:  Normal tongue strength and range of motion, no deviation or fasciculation.   MOTOR:  No atrophy, fasciculations or abnormal movements.  No pronator drift.  Neck flexion and extension is 5/5.  No fatigability on motor testing. Upper Extremity:  Right  Left  Deltoid  5/5   5/5   Biceps  5/5   5/5   Triceps  5/5   5/5   Infraspinatus 5/5  5/5  Medial pectoralis 5/5  5/5  Wrist extensors  5/5   5/5   Wrist flexors  5/5   5/5   Finger extensors  5/5  5/5   Finger flexors  5/5   5/5   Dorsal interossei  5/5   5/5   Abductor pollicis  5/5   5/5   Tone (Ashworth scale)  0  0   Lower Extremity:  Right  Left  Hip flexors  5/5   5/5   Hip extensors  5/5   5/5   Adductor 5/5  5/5  Abductor 5/5  5/5  Knee flexors  5/5   5/5   Knee extensors  5/5   5/5   Dorsiflexors  5/5   5/5   Plantarflexors  5/5   5/5   Toe extensors  5/5   5/5   Toe flexors  5/5   5/5   Tone (Ashworth scale)  0  0   MSRs:  Right        Left                  brachioradialis 2+  2+  biceps 2+  2+  triceps 2+  2+  patellar 3+  3+  ankle jerk 2+  2+  Hoffman no  no  plantar response down  down   SENSORY:  Normal and symmetric perception of vibration  COORDINATION/GAIT: Normal finger-to- nose-finger .  Intact rapid alternating movements bilaterally  Gait is slow, assisted with walker, antalgic due to right hip pain.  He is able to stand on heels and toes.   IMPRESSION: Seropositive myasthenia gravis (diagnosed 2017) without exacerbation, thymoma negative. Patient does not have any features of MG exacerbation by history or exam at this time and it would be reasonable to taper his prednisone by 10mg  per month.  He is scheduled to see Dr. in February and can be reassessed.  I agree with  Dr. March about consideration for Cellcept for steroid-sparing medication Continue mestinon 60mg  three times daily, if his cramps get worse, he may reduce to twice daily, especially as he does not appreciate marked benefit when taking it  He has brisk reflexes in the legs which may represent some degree of canal stenosis.  Consider MRI lumbar spine, if he develops new leg weakness   Finally, I informed patient that community practices do not have the resources to spend extended visit time on regular follow-up and acknowledged his frustration with this.  If he would like a more comprehensive approach with longer appointment times, he may consider an academic center where a resident/fellow clinic may allow that.   Total time spent reviewing records, interview, history/exam, documentation, and coordination of care on day of encounter:  50 min    Thank you for allowing me to participate in patient's care.  If I can answer any additional questions, I would be pleased to do so.    Sincerely,    Ari Bernabei K. Marjory Lies, DO

## 2020-11-16 NOTE — Patient Instructions (Addendum)
Follow-up with Dr. Marjory Lies as planned.  I will send my recommendations to Dr. Marjory Lies, as requested.

## 2020-11-22 ENCOUNTER — Encounter: Payer: Self-pay | Admitting: Orthopaedic Surgery

## 2020-11-22 ENCOUNTER — Encounter: Payer: Self-pay | Admitting: Family Medicine

## 2020-11-22 NOTE — Telephone Encounter (Signed)
Please refer patient to Dr. Dimple Casey per his request for management of his arthritis.   Please send him a hip replacement pamphlet as well as information on the total joint replacement class that he can attend.  Thank you.

## 2020-11-23 ENCOUNTER — Other Ambulatory Visit: Payer: Self-pay

## 2020-11-23 DIAGNOSIS — M25512 Pain in left shoulder: Secondary | ICD-10-CM

## 2020-11-23 DIAGNOSIS — M1611 Unilateral primary osteoarthritis, right hip: Secondary | ICD-10-CM

## 2020-11-28 NOTE — Progress Notes (Signed)
Office Visit Note  Patient: Jonathan Mcgee             Date of Birth: 05-02-1951           MRN: [id number]             PCP: Shade Flood, MD Referring: Shade Flood, MD Visit Date: 11/29/2020   Subjective:  New Patient (Initial Visit) (Patient was previously seen by Dr. Nickola Major - she had patient on MTX at one point and more recently on Prednisone. )   History of Present Illness: Jonathan Mcgee is a 70 y.o. male with a history of myasthenia gravis, generalized osteoarthritis, and DISH here for evaluation of RA. Previously seen by Dr. Nickola Major since.a few months ago for these problems most recently in December of last year. His RA diagnosis starts around hospitalization in 2020 with severe lower extremity and shoulder pain and swelling evaluated by Dr. Nydia Bouton with [facility]. His most problematic joint pain is the right hip and has had weakness in this leg with gait instability requiring him to use a cane. He was later seen in outpatient clinic locally and started on methotrexate although not clear if he was having active joint inflammation or more OA related pain. His prednisone was increased to 60mg  daily and received course of IVIG treatment in November and December and methotrexate was discontinued since he did not have active inflammatory disease with rheumatology visit in December. Currently he is on 40mg  prednisone slightly tapering his dose. He is having significant swelling in his legs.   Activities of Daily Living:  Patient reports morning stiffness for 1-24 hours.   Patient Reports nocturnal pain.  Difficulty dressing/grooming: Reports Difficulty climbing stairs: Reports Difficulty getting out of chair: Reports Difficulty using hands for taps, buttons, cutlery, and/or writing: Reports  Review of Systems  Constitutional: Positive for fatigue.  HENT: Negative for mouth sores, mouth dryness and nose dryness.   Eyes: Positive for visual disturbance and dryness. Negative for pain and  itching.  Respiratory: Positive for shortness of breath and difficulty breathing. Negative for cough and hemoptysis.        With exertion  Cardiovascular: Positive for swelling in legs/feet. Negative for chest pain and palpitations.  Gastrointestinal: Negative for abdominal pain, blood in stool, constipation and diarrhea.  Endocrine: Negative for increased urination.  Genitourinary: Negative for painful urination.  Musculoskeletal: Positive for arthralgias, joint pain, joint swelling, myalgias, muscle weakness, morning stiffness, muscle tenderness and myalgias.  Skin: Negative for color change, rash and redness.  Allergic/Immunologic: Negative for susceptible to infections.  Neurological: Positive for parasthesias and weakness. Negative for dizziness, numbness, headaches and memory loss.  Hematological: Negative for swollen glands.  Psychiatric/Behavioral: Positive for sleep disturbance. Negative for confusion.    PMFS History:  Patient Active Problem List   Diagnosis Date Noted   DISH (diffuse idiopathic skeletal hyperostosis) 11/29/2020   Primary osteoarthritis of right hip 11/14/2020   Rheumatoid arthritis (HCC) 09/23/2019   Morbid obesity (HCC) 07/19/2019   UTI (urinary tract infection) 02/20/2019   Suspected COVID-19 virus infection 02/20/2019   Type 2 diabetes mellitus (HCC) 02/20/2019   Sepsis (HCC) 02/19/2019   CKD (chronic kidney disease) 04/12/2017   Essential hypertension 01/13/2017   Hyperlipidemia 01/13/2017   Myasthenia gravis (HCC) 08/28/2016    Past Medical History:  Diagnosis Date   Arthritis    Diabetes mellitus without complication (HCC)    Hypercholesterolemia    Hypertension    Myasthenia gravis (HCC)    Rheumatoid arthritis (  HCC)     Family History  Problem Relation Age of Onset   Diabetes Mother    Heart disease Mother    Hyperlipidemia Mother    Heart attack Mother    Heart murmur Mother    Hyperlipidemia Father     Kidney failure Father    Drug abuse Maternal Grandmother    Heart disease Maternal Grandmother    Hypertension Maternal Grandmother    Hyperlipidemia Maternal Grandmother    Diabetes Maternal Grandmother    Past Surgical History:  Procedure Laterality Date   CATARACT EXTRACTION, BILATERAL  2020   KNEE SURGERY Right    fractured patella > 20 yrs ago   PENILE PROSTHESIS IMPLANT  2015   Social History   Social History Narrative   Drinks 2-3 caffeine drinks a day    Left handed   One story    Immunization History  Administered Date(s) Administered   Hepatitis B, adult 07/18/2016   PFIZER SARS-COV-2 Vaccination 01/09/2020, 02/01/2020, 08/19/2020     Objective: Vital Signs: BP 134/73 (BP Location: Left Arm, Patient Position: Sitting, Cuff Size: Normal)    Pulse 65    Ht 5\' 9"  (1.753 m)    Wt 199 lb 8 oz (90.5 kg)    BMI 29.46 kg/m    Physical Exam HENT:     Right Ear: External ear normal.     Left Ear: External ear normal.     Mouth/Throat:     Mouth: Mucous membranes are moist.     Pharynx: Oropharynx is clear.  Eyes:     Conjunctiva/sclera: Conjunctivae normal.  Cardiovascular:     Rate and Rhythm: Normal rate and regular rhythm.     Comments: Pitting edema of both legs below knees Pulmonary:     Effort: Pulmonary effort is normal.     Breath sounds: Normal breath sounds.  Skin:    General: Skin is warm and dry.     Findings: No rash.  Neurological:     General: No focal deficit present.     Mental Status: He is alert.  Psychiatric:        Mood and Affect: Mood normal.     Musculoskeletal Exam:  Neck range of motion reduced in all planes especially extension Shoulder, elbow, wrist, fingers no swelling or tenderness, bony nodules present over olecranon process of both elbows Increased kyphosis, increased wall to tragus distance Hip internal rotation partially reduced on left very reduced on right, FABER provokes posterior hip pain Bilateral  patellofemoral crepitus    Investigation: No additional findings.  Imaging: Guided Needle Placement - No Linked Charges  Result Date: 11/15/2020 Ultrasound guided injection is preferred based studies that show increased duration, increased effect, greater accuracy, decreased procedural pain, increased response rate, and decreased cost with ultrasound guided versus blind injection.   Verbal informed consent obtained.  Time-out conducted.  Noted no overlying erythema, induration, or other signs of local infection. Ultrasound-guided right hip injection: After sterile prep with Betadine, injected 4 cc 0.25% bupivacaine without epinephrine and 6 mg betamethasone using a 22-gauge spinal needle, passing the needle through the iliofemoral ligament into the femoral head/neck junction.  Injectate seen filling joint capsule.  Good immediate relief.    XR HIP UNILAT W OR W/O PELVIS 1V RIGHT  Result Date: 11/14/2020 Significant degenerative changes to the right hip  XR Cervical Spine 2 or 3 views  Result Date: 11/14/2020 Significant multilevel degenerative changes with anterior spurring  XR Shoulder Left  Result Date:  11/14/2020 X-rays demonstrate AC degenerative changes.    VAS [place] LOWER EXTREMITY VENOUS (DVT)  Result Date: 11/11/2020  Lower Venous DVT Study Indications: Swelling.  Risk Factors: None identified. Limitations: Poor ultrasound/tissue interface. Comparison Study: No prior studies. Performing Technologist: Oliver Hum RVT  Examination Guidelines: A complete evaluation includes B-mode imaging, spectral Doppler, color Doppler, and power Doppler as needed of all accessible portions of each vessel. Bilateral testing is considered an integral part of a complete examination. Limited examinations for reoccurring indications may be performed as noted. The reflux portion of the exam is performed with the patient in reverse Trendelenburg.   +-----+---------------+---------+-----------+----------+--------------+  RIGHT Compressibility Phasicity Spontaneity Properties Thrombus Aging  +-----+---------------+---------+-----------+----------+--------------+  CFV   Full            Yes       Yes                                    +-----+---------------+---------+-----------+----------+--------------+   +---------+---------------+---------+-----------+----------+--------------+  LEFT      Compressibility Phasicity Spontaneity Properties Thrombus Aging  +---------+---------------+---------+-----------+----------+--------------+  CFV       Full            Yes       Yes                                    +---------+---------------+---------+-----------+----------+--------------+  SFJ       Full                                                             +---------+---------------+---------+-----------+----------+--------------+  FV Prox   Full                                                             +---------+---------------+---------+-----------+----------+--------------+  FV Mid    Full                                                             +---------+---------------+---------+-----------+----------+--------------+  FV Distal Full                                                             +---------+---------------+---------+-----------+----------+--------------+  PFV       Full                                                             +---------+---------------+---------+-----------+----------+--------------+  POP       Full            Yes       Yes                                    +---------+---------------+---------+-----------+----------+--------------+  PTV       Full                                                             +---------+---------------+---------+-----------+----------+--------------+  PERO      Full                                                              +---------+---------------+---------+-----------+----------+--------------+     Summary: RIGHT: - No evidence of common femoral vein obstruction.  LEFT: - There is no evidence of deep vein thrombosis in the lower extremity.  - No cystic structure found in the popliteal fossa.  *See table(s) above for measurements and observations. Electronically signed by Sherald Hess MD on 11/11/2020 at 5:17:17 PM.    Final     Recent Labs: Lab Results  Component Value Date   WBC 10.0 07/24/2020   HGB 10.5 (L) 07/24/2020   PLT 254 07/24/2020   NA 139 11/10/2020   K 4.3 11/10/2020   CL 99 11/10/2020   CO2 23 11/10/2020   GLUCOSE 162 (H) 11/10/2020   BUN 31 (H) 11/10/2020   CREATININE 1.70 (H) 11/10/2020   BILITOT 0.3 10/13/2020   ALKPHOS 60 10/13/2020   AST 11 10/13/2020   ALT 13 10/13/2020   PROT 6.7 10/13/2020   ALBUMIN 4.4 10/13/2020   CALCIUM 9.4 11/10/2020   GFRAA 46 (L) 11/10/2020    Speciality Comments: No specialty comments available.  Procedures:  No procedures performed Allergies: Lisinopril   Assessment / Plan:     Visit Diagnoses: Rheumatoid arthritis, involving unspecified site, unspecified whether rheumatoid factor present (HCC)  Seropositive arthritis currently no disease activity is evident. His current joint pains seem more consistent with degenerative change. This is on a high dose of prednisone, synovitis may recur with tapering so we should follow up. He was tolerating methotrexate previously so would consider this for steroid sparing DMARD.  Primary osteoarthritis of right hip  Very limited internal rotation of right hip and severe loss of joint space on xray from 12/21. This may be exacerbated by his lumbar spine reduced ROM also chronic prednisone use for MG. Continue exercises for this but he should also f/u with orthopedics may be a candidate for THA.  DISH (diffuse idiopathic skeletal hyperostosis)  Limited neck and low back range of motion and review of  previous imaging looks consistent with DISH. Also has extensive bony nodules of knees, elbows, and elsewhere.  Orders: No orders of the defined types were placed in this encounter.  No orders of the defined types were placed in this encounter.   Follow-Up Instructions: Return in about 3 months (around 02/27/2021) for RA f/u.   Fuller Plan, MD  Note -  This record has been created using Dragon software.  °Chart creation errors have been sought, but may not always  °have been located. Such creation errors do not reflect on  °the standard of medical care. ° °

## 2020-11-29 ENCOUNTER — Other Ambulatory Visit: Payer: Self-pay

## 2020-11-29 ENCOUNTER — Ambulatory Visit: Payer: Medicare PPO | Admitting: Internal Medicine

## 2020-11-29 ENCOUNTER — Encounter: Payer: Self-pay | Admitting: Internal Medicine

## 2020-11-29 VITALS — BP 134/73 | HR 65 | Ht 69.0 in | Wt 199.5 lb

## 2020-11-29 DIAGNOSIS — M069 Rheumatoid arthritis, unspecified: Secondary | ICD-10-CM | POA: Diagnosis not present

## 2020-11-29 DIAGNOSIS — M1611 Unilateral primary osteoarthritis, right hip: Secondary | ICD-10-CM | POA: Diagnosis not present

## 2020-11-29 DIAGNOSIS — M4810 Ankylosing hyperostosis [Forestier], site unspecified: Secondary | ICD-10-CM

## 2020-11-29 NOTE — Patient Instructions (Signed)
Rheumatoid Arthritis Rheumatoid arthritis (RA) is a long-term (chronic) disease that causes inflammation in your joints. RA may start slowly. It most often affects the small joints of the hands and feet. Usually, the same joints are affected on both sides of your body. Inflammation from RA can also affect other parts of your body, including your heart, eyes, or lungs. There is no cure for RA, but medicines can help your symptoms and halt or slow down the progression of the disease. What are the causes? RA is an autoimmune disease. When you have an autoimmune disease, your body's defense system (immune system) mistakenly attacks healthy body tissues. The exact cause of RA is not known. What increases the risk? You are more likely to develop this condition if you:  Are a woman.  Have a family history of RA or other autoimmune diseases.  Have a history of smoking.  Are obese.  Have been exposed to pollutants or chemicals. What are the signs or symptoms? The first symptom of this condition may be morning stiffness that lasts longer than 30 minutes.  Symptoms usually start gradually. They are often worse in the morning. As RA progresses, symptoms may include:  Pain, stiffness, swelling, warmth, and tenderness in joints on both sides of your body.  Loss of energy.  Loss of appetite.  Weight loss.  Low-grade fever.  Dry eyes and dry mouth.  Firm lumps (rheumatoid nodules) that grow beneath your skin in areas such as your forearm bones near your elbows and on your hands.  Changes in the appearance of joints (deformity) and loss of joint function. Symptoms of this condition vary from person to person.  Symptoms of RA often come and go.  Sometimes, symptoms get worse for a period of time. These are called flares. How is this diagnosed? This condition is diagnosed based on your symptoms, medical history, and physical exam.  You may have X-rays or an MRI to check for the type of  joint changes that are caused by RA. You may also have blood tests to look for:  Proteins (antibodies) that your immune system may make if you have RA. These include rheumatoid factor (RF) and anti-CCP. ? When blood tests show these proteins, you are said to have "seropositive RA." ? When blood tests do not show these proteins, you may have "seronegative RA."  Inflammation in your blood.  A low number of red blood cells (anemia). How is this treated? The goals of treatment are to relieve pain, reduce inflammation, and slow down or stop joint damage and disability. Treatment may include:  Lifestyle changes. It is important to rest as needed, eat a healthy diet, and exercise.  Medicines. Your health care provider may adjust your medicines every 3 months until treatment goals are reached. Common medicines include: ? Pain relievers (analgesics). ? Corticosteroids and NSAIDs to reduce inflammation. ? Disease-modifying antirheumatic drugs (DMARDs) to try to slow the course of the disease. ? Biologic response modifiers to reduce inflammation and damage.  Physical therapy and occupational therapy.  Surgery, if you have severe joint damage. Joint replacement or fusing of joints may be needed. Your health care provider will work with you to identify the best treatment option for you based on assessment of the overall disease activity in your body.   Follow these instructions at home: Activity  Return to your normal activities as told by your health care provider. Ask your health care provider what activities are safe for you.  Rest when you are   having a flare.  Start an exercise program as told by your health care provider. General instructions  Keep all follow-up visits as told by your health care provider. This is important.  Take over-the-counter and prescription medicines only as told by your health care provider. Where to find more information  American College of Rheumatology:  www.rheumatology.org  Arthritis Foundation: www.arthritis.org Contact a health care provider if:  You have a flare-up of RA symptoms.  You have a fever.  You have side effects from your medicines. Get help right away if:  You have chest pain.  You have trouble breathing.  You quickly develop a hot, painful joint that is more severe than your usual joint aches. Summary  Rheumatoid arthritis (RA) is a long-term (chronic) disease that causes inflammation in your joints.  RA is an autoimmune disease.  The goals of treatment are to relieve pain, reduce inflammation, and slow down or stop joint damage and disability.  

## 2020-12-05 ENCOUNTER — Telehealth: Payer: Self-pay | Admitting: Diagnostic Neuroimaging

## 2020-12-05 NOTE — Telephone Encounter (Signed)
Pt sent request to scheduling pool- scheduled for 01/26 at 3:00 with Dr. Marjory Lies (ok per RN).

## 2020-12-11 ENCOUNTER — Ambulatory Visit: Payer: Medicare PPO | Admitting: Neurology

## 2020-12-18 ENCOUNTER — Encounter: Payer: Self-pay | Admitting: Orthopaedic Surgery

## 2020-12-18 NOTE — Telephone Encounter (Signed)
Just waiting for cardiac clearance and A1c to be less than 7.8.

## 2020-12-18 NOTE — Telephone Encounter (Signed)
Please advise patient. Thanks

## 2020-12-20 ENCOUNTER — Ambulatory Visit: Payer: Medicare PPO | Admitting: Diagnostic Neuroimaging

## 2020-12-20 ENCOUNTER — Encounter: Payer: Self-pay | Admitting: Diagnostic Neuroimaging

## 2020-12-20 VITALS — BP 144/59 | HR 74 | Ht 69.0 in | Wt 208.0 lb

## 2020-12-20 DIAGNOSIS — G7000 Myasthenia gravis without (acute) exacerbation: Secondary | ICD-10-CM | POA: Diagnosis not present

## 2020-12-20 MED ORDER — PREDNISONE 10 MG PO TABS: 30.0000 mg | ORAL_TABLET | Freq: Every day | ORAL | 12 refills | Status: DC

## 2020-12-20 NOTE — Patient Instructions (Signed)
  MYASTHENIA GRAVIS - continue pyridostigmine 60mg  3x per day - reduce prednisone to 30mg  daily; after 1 month, continue close BP and DM monitoring and control

## 2020-12-20 NOTE — Progress Notes (Signed)
[facility]  PATIENT: Jonathan Mcgee DOB: 05/22/1951  REFERRING CLINICIAN: Deliah Boston, PA-c HISTORY FROM: patient  REASON FOR VISIT: follow up    HISTORICAL  CHIEF COMPLAINT:  Chief Complaint  Patient presents with  . 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 Continues on mestinon and prednisone. Continues with right hip pain, mobility issues. Planning for right hip surgery in end of Feb 2022.   UPDATE (07/24/20, VRP): Since last visit, doing poorly; more mobility and weakness issues. Needs a cane now. Symptoms are progressive. Severity is increased. No alleviating or aggravating factors. Tolerating mestinon and prednisone.    UPDATE (01/18/20, VRP): Since last visit, more swelling in legs; rheumatology requesting to see if prednisone dosing can be reduced. Symptoms are progressive.  UPDATE (07/19/19, VRP): Since last visit, doing poorly (more fatigue, muscle weakness in arms). Had phone call with [facility] rheumatology but no other follow up. Prior joint pain and leg issues are improved. Double vision and swallowing issues are resolved. Tolerating pyridostigmine. Prednisone is completed.  UPDATE (02/24/19, VRP): Since last visit, had started on imuran 1 month ago. Then had fevers, pyuria, and weakness. Also had significant knee pain, and admitted to hospital (March 2020). Dx'd with rheumatoid arthritis and UTI; imuran was stopped.  No focal or localized signs to suggest myasthenia gravis sedation or crisis.  Prednisone 10 mg was started and patient was referred to rheumatology outpatient.  Of note patient was tested for flu and nCoV both which were negative.  Patient now back home.  He is doing well.  No fevers.  He is completing his antibiotic course.  He is on  prednisone 10 mg daily.  Pain is subsided. MG symptoms are stable since last visit. Breathing, chewing, swallowing stable.   UPDATE (01/18/19, VRP): Since last visit, doing slightly worse. Mild weakness in left leg. Slightly mild generalized fatigue and weakness. Worse with exertion. Symptoms are worse than 2019. No speech, swallow or breathing issues. Tolerating mestinon.   UPDATE (04/15/18, VRP): Since last visit, doing about the same with MG. Tolerating mestinon. No alleviating or aggravating factors. Now planning to move to [place] for work.  UPDATE 07/16/17: Since last visit, overall stable. Mild generalized fatigue continues. Overall stable. Notices some more muscle strain.   UPDATE 02/12/17: Since last visit, BP is better (meds have been adjusted). Now has CPAP machine (about to start). Sugars are better now (averaging ~150's). Intermittent double vision continues. Mild generalized fatigue, but no focal weakness.   UPDATE 12/31/16: Since last visit, IVIG completed, and general energy little better, but muscle cramps worse. BP running high. He has not checked sugars lately. PSG showed mod-severe OSA, and CPAP titration study is pending. Tolerating mestinon 60mg  BID.   UPDATE 10/09/16: Since last visit, double vision is slightly better on mestinon 60mg  TID. However having more muscle cramps, excess saliva production. Fatigable weakness is persistent.   PRIOR HPI (08/28/16): 70 year old ambidextrous male with hypertension, diabetes, hypercholesterolemia here for evaluation of myasthenia gravis. For past 1-2 years patient has had mild intermittent right-sided ptosis. In May 2017 he noticed onset of double vision, blurred vision, wavy vision. This is progressively worsened. For past 1 month he has had increasing fatigue with walking. Is having difficulty with upper and lower extremity strength. He denies any  speech, swallowing, breathing difficulty. His balance has been slightly off. Patient went to PCP,  ophthalmology, had [facility] antibody testing which confirmed diagnosis of myasthenia gravis.   REVIEW OF SYSTEMS: Full 14 system review of systems performed and negative with exception of: as per HPI.   ALLERGIES: Allergies  Allergen Reactions  . Lisinopril Swelling    Facial swelling    HOME MEDICATIONS: Outpatient Medications Prior to Visit  Medication Sig Dispense Refill  . amLODipine (NORVASC) 5 MG tablet TAKE 1 & 1/2 (ONE & ONE-HALF) TABLETS BY MOUTH ONCE DAILY 135 tablet 0  . atorvastatin (LIPITOR) 20 MG tablet Take 1 tablet (20 mg total) by mouth daily. 90 tablet 3  . folic acid (FOLVITE) 1 MG tablet Take 1 tablet (1 mg total) by mouth daily. 30 tablet 0  . furosemide (LASIX) 20 MG tablet Take 1 tablet (20 mg total) by mouth daily. 30 tablet 1  . glipiZIDE (GLUCOTROL) 5 MG tablet Take 0.5 tablets (2.5 mg total) by mouth daily before breakfast. 90 tablet 1  . hydrochlorothiazide (HYDRODIURIL) 25 MG tablet Take 1 tablet (25 mg total) by mouth daily. 90 tablet 1  . losartan (COZAAR) 100 MG tablet Take 1 tablet (100 mg total) by mouth daily. 90 tablet 1  . metFORMIN (GLUCOPHAGE) 1000 MG tablet Take 1 tablet (1,000 mg total) by mouth 2 (two) times daily with a meal. 180 tablet 1  . predniSONE (DELTASONE) 10 MG tablet Take 6 tablets (60 mg total) by mouth daily with breakfast. (Patient taking differently: Take 40 mg by mouth daily with breakfast. Patient is currently tapering - started at 60 mg and currently at 40 mg.) 180 tablet 12  . pyridostigmine (MESTINON) 60 MG tablet TAKE 1 TABLET BY MOUTH THREE TIMES DAILY 180 tablet 3   No facility-administered medications prior to visit.    PAST MEDICAL HISTORY: Past Medical History:  Diagnosis Date  . Arthritis   . Diabetes mellitus without complication (HCC)   . Hypercholesterolemia   . Hypertension   . Myasthenia gravis (HCC)   . Rheumatoid arthritis (HCC)     PAST SURGICAL HISTORY: Past Surgical History:  Procedure Laterality  Date  . CATARACT EXTRACTION, BILATERAL  2020  . KNEE SURGERY Right    fractured patella > 20 yrs ago  . PENILE PROSTHESIS IMPLANT  2015    FAMILY HISTORY: Family History  Problem Relation Age of Onset  . Diabetes Mother   . Heart disease Mother   . Hyperlipidemia Mother   . Heart attack Mother   . Heart murmur Mother   . Hyperlipidemia Father   . Kidney failure Father   . Drug abuse Maternal Grandmother   . Heart disease Maternal Grandmother   . Hypertension Maternal Grandmother   . Hyperlipidemia Maternal Grandmother   . Diabetes Maternal Grandmother     SOCIAL HISTORY:  Social History   Socioeconomic History  . Marital status: Single    Spouse name: Not on file  . Number of children: 1  . Years of education: 38  . Highest education level: Not on file  Occupational History    Comment: [employer]  Tobacco Use  . Smoking status: Current Some Day Smoker    Packs/day: 1.00    Types: Cigars  . Smokeless tobacco: Never Used  Vaping Use  . Vaping Use: Never used  Substance and Sexual Activity  . Alcohol use: Yes    Comment: occas  . Drug use: No  . Sexual activity: Not on file  Other Topics Concern  . Not on file  Social History Narrative   Drinks 2-3 caffeine drinks a day    Left handed   One story    Social Determinants of Health   Financial Resource Strain: Not on file  Food Insecurity: Not on file  Transportation Needs: Not on file  Physical Activity: Not on file  Stress: Not on file  Social Connections: Not on file  Intimate Partner Violence: Not on file     PHYSICAL EXAM  GENERAL EXAM/CONSTITUTIONAL: Vitals:  Vitals:   12/20/20 1459  BP: (!) 144/59  Pulse: 74  Weight: 208 lb (94.3 kg)  Height: 5\' 9"  (1.753 m)    Wt Readings from Last 3 Encounters:  12/20/20 208 lb (94.3 kg)  11/29/20 199 lb 8 oz (90.5 kg)  11/16/20 200 lb (90.7 kg)   Body mass index is 30.72 kg/m. No exam data present  Patient is in no distress; well developed, nourished  and groomed; neck is supple  NEUROLOGIC: MENTAL STATUS:  No flowsheet data found.  awake, alert, oriented to person, place and time  recent and remote memory intact  normal attention and concentration  language fluent, comprehension intact, naming intact,   fund of knowledge appropriate  CRANIAL NERVE:   2nd, 3rd, 4th, 6th - pupils equal and reactive to light, visual fields full to confrontation, extraocular muscles --> NO DOUBLE VISION   5th - facial sensation symmetric  7th - facial strength symmetric  8th - hearing intact  9th - palate elevates symmetrically, uvula midline  11th - shoulder shrug symmetric  12th - tongue protrusion midline  NO DYSARTHRIA  MOTOR:   BUE 4  BLE 4 PROX (limited by pain; right worse than left); 5 DISTAL  COORDINATION:   fine finger movements normal  GAIT / STATION  SHORT STEPS; CAUTIOUS GAIT; USING CANE     DIAGNOSTIC DATA (LABS, IMAGING, TESTING) - I reviewed patient records, labs, notes, testing and imaging myself where available.  Lab Results  Component Value Date   WBC 10.0 07/24/2020   HGB 10.5 (L) 07/24/2020   HCT 29.8 (L) 07/24/2020   MCV 93 07/24/2020   PLT 254 07/24/2020      Component Value Date/Time   NA 139 11/10/2020 1649   K 4.3 11/10/2020 1649   CL 99 11/10/2020 1649   CO2 23 11/10/2020 1649   GLUCOSE 162 (H) 11/10/2020 1649   GLUCOSE 113 (H) 02/21/2019 0244   BUN 31 (H) 11/10/2020 1649   CREATININE 1.70 (H) 11/10/2020 1649   CREATININE 1.39 (H) 06/29/2016 1626   CALCIUM 9.4 11/10/2020 1649   PROT 6.7 10/13/2020 1659   ALBUMIN 4.4 10/13/2020 1659   AST 11 10/13/2020 1659   ALT 13 10/13/2020 1659   ALKPHOS 60 10/13/2020 1659   BILITOT 0.3 10/13/2020 1659   GFRNONAA 40 (L) 11/10/2020 1649   GFRNONAA 53 (L) 06/29/2016 1626   GFRAA 46 (L) 11/10/2020 1649   GFRAA 61 06/29/2016 1626   Lab Results  Component Value Date   CHOL 145 07/19/2020   HDL 52 07/19/2020   LDLCALC 70 07/19/2020    TRIG 134 07/19/2020   CHOLHDL 2.8 07/19/2020   Hgb A1c MFr Bld  Date Value Ref Range Status  10/13/2020 8.1 (H) 4.8 - 5.6 % Final    Comment:             Prediabetes: 5.7 - 6.4          Diabetes: >6.4  Glycemic control for adults with diabetes: <7.0   07/19/2020 7.2 (H) 4.8 - 5.6 % Final    Comment:             Prediabetes: 5.7 - 6.4          Diabetes: >6.4          Glycemic control for adults with diabetes: <7.0   01/19/2020 5.9 (H) 4.8 - 5.6 % Final    Comment:             Prediabetes: 5.7 - 6.4          Diabetes: >6.4          Glycemic control for adults with diabetes: <7.0     No results found for: VITAMINB12 Lab Results  Component Value Date   TSH 3.650 10/24/2018    07/04/16 CT head [I reviewed images myself and agree with interpretation. -VRP]  - Mild microvascular disease without acute intracranial process.  09/04/16 CT chest  1. No mediastinal mass or adenopathy. 2. No infiltrate or pulmonary edema. 3. Degenerative changes thoracic spine. 4. Atherosclerotic calcifications of thoracic aorta. 5. Mild fatty infiltration of the liver.  12/31/16 LABS TPMT Activity: Units/mL RBC 31.8   Comment: Reference Range:  Normal: 15.1 - 26.4  Heterozygous for low TPMT variant: 6.3 - 15.0  Homozygous for low TPMT variant: <6.3        ASSESSMENT AND PLAN  70 y.o. year old male here with new onset diagnosis of myasthenia gravis with ocular and generalized features. Was having side effects from mestinon, but also some improvement in ocular symptoms. IVIG has helped generalized symptoms in the past.     Dx: (ocular + generalized) myasthenia gravis  1. Myasthenia gravis (HCC)      PLAN:  MYASTHENIA GRAVIS (stable) - continue pyridostigmine  3x per day - reduce prednisone to  daily; after 1 month, can reduce to  daily; continue close BP and DM monitoring and control - could consider cellcept for long term steroid sparing treatment - would not  restart imuran at this time (due to infx within 1 month of starting) - caution with gait and balance; use cane  JOINT PAIN (positive CCP) - per rheumatology; off methotrexate  DIABETES / HYPERTENSION - continue BP and DM control  SLEEP APNEA - continue CPAP usage  Meds ordered this encounter  Medications  . predniSONE (DELTASONE) 10 MG tablet    Sig: Take 3 tablets (30 mg total) by mouth daily with breakfast.    Dispense:  90 tablet    Refill:  12   Return in about 4 months (around 04/19/2021).   Suanne Marker, MD 12/20/2020, 3:12 PM Certified in Neurology, Neurophysiology and Neuroimaging  [facility] [address], [address] [place], [place] [address] [phone]

## 2020-12-21 ENCOUNTER — Encounter: Payer: Self-pay | Admitting: Family Medicine

## 2020-12-22 NOTE — Progress Notes (Signed)
CARDIOLOGY CONSULT NOTE       Patient ID: Jonathan Mcgee MRN: [id number] DOB/AGE: [birth year] 70 y.o.  Admit date: (Not on file) Referring Physician: Neva Seat Primary Physician: Shade Flood, MD Primary Cardiologist: New Reason for Consultation: Murmur   Active Problems:   * No active hospital problems. *   HPI:  70 y.o. referred by Dr Neva Seat for murmur on 02/15/20  She has a history of DM, HTN, HLD, CRF, Myasthenia, RA on methotrexate She has significant muscle weakness in arms and fatigue from myasthenia Now off steroids Still smokes less than a ppd   She received COVID vaccines and booster   Office visit 10/18/19 primary noted 1-2/6 SEM LLSB  No history of RF, radiation or fen phen use   He has lived in [place]. Has son in [place]. Does commercial contracting Activity limited by arthritis Rx with methotrexate and steroids in past   He describes having nuclear stress test years ago but it Was stopped due to HTN response and does not appear to be cath f/u  He has white coat HTN component   Myesthenia has been well controlled Initially diagnosed with double vision   TTE 02/29/20 EF 60-65% mild AR trivial MR Myovue 02/29/20 normal no ischemia EF 59%  Needs right hip surgery soon. Neurology/Rheumatology have been debating dose of steroids and MTX He has had some LE edema on higher dose prednisone Also needs to have A1c < 8 prior to surgery   ROS All other systems reviewed and negative except as noted above  Past Medical History:  Diagnosis Date  . Arthritis   . Diabetes mellitus without complication (HCC)   . Hypercholesterolemia   . Hypertension   . Myasthenia gravis (HCC)   . Rheumatoid arthritis (HCC)     Family History  Problem Relation Age of Onset  . Diabetes Mother   . Heart disease Mother   . Hyperlipidemia Mother   . Heart attack Mother   . Heart murmur Mother   . Hyperlipidemia Father   . Kidney failure Father   . Drug abuse Maternal Grandmother   .  Heart disease Maternal Grandmother   . Hypertension Maternal Grandmother   . Hyperlipidemia Maternal Grandmother   . Diabetes Maternal Grandmother     Social History   Socioeconomic History  . Marital status: Single    Spouse name: Not on file  . Number of children: 1  . Years of education: 100  . Highest education level: Not on file  Occupational History    Comment: [employer]  Tobacco Use  . Smoking status: Current Some Day Smoker    Packs/day: 1.00    Types: Cigars  . Smokeless tobacco: Never Used  Vaping Use  . Vaping Use: Never used  Substance and Sexual Activity  . Alcohol use: Yes    Comment: occas  . Drug use: No  . Sexual activity: Not on file  Other Topics Concern  . Not on file  Social History Narrative   Drinks 2-3 caffeine drinks a day    Left handed   One story    Social Determinants of Health   Financial Resource Strain: Not on file  Food Insecurity: Not on file  Transportation Needs: Not on file  Physical Activity: Not on file  Stress: Not on file  Social Connections: Not on file  Intimate Partner Violence: Not on file    Past Surgical History:  Procedure Laterality Date  . CATARACT EXTRACTION, BILATERAL  2020  . KNEE  SURGERY Right    fractured patella > 20 yrs ago  . PENILE PROSTHESIS IMPLANT  2015      Current Outpatient Medications:  .  amLODipine (NORVASC) 5 MG tablet, TAKE 1 & 1/2 (ONE & ONE-HALF) TABLETS BY MOUTH ONCE DAILY, Disp: 135 tablet, Rfl: 0 .  atorvastatin (LIPITOR) 20 MG tablet, Take 1 tablet (20 mg total) by mouth daily., Disp: 90 tablet, Rfl: 3 .  folic acid (FOLVITE) 1 MG tablet, Take 1 tablet (1 mg total) by mouth daily., Disp: 30 tablet, Rfl: 0 .  furosemide (LASIX) 20 MG tablet, Take 1 tablet (20 mg total) by mouth daily., Disp: 30 tablet, Rfl: 1 .  glipiZIDE (GLUCOTROL) 5 MG tablet, Take 0.5 tablets (2.5 mg total) by mouth daily before breakfast., Disp: 90 tablet, Rfl: 1 .  hydrochlorothiazide (HYDRODIURIL) 25 MG tablet, Take 1  tablet (25 mg total) by mouth daily., Disp: 90 tablet, Rfl: 1 .  losartan (COZAAR) 100 MG tablet, Take 1 tablet (100 mg total) by mouth daily., Disp: 90 tablet, Rfl: 1 .  metFORMIN (GLUCOPHAGE) 1000 MG tablet, Take 1 tablet (1,000 mg total) by mouth 2 (two) times daily with a meal., Disp: 180 tablet, Rfl: 1 .  predniSONE (DELTASONE) 10 MG tablet, Take 3 tablets (30 mg total) by mouth daily with breakfast., Disp: 90 tablet, Rfl: 12 .  pyridostigmine (MESTINON) 60 MG tablet, TAKE 1 TABLET BY MOUTH THREE TIMES DAILY, Disp: 180 tablet, Rfl: 3    Physical Exam: Blood pressure (!) 142/56, pulse 88, height 5\' 9"  (1.753 m), weight 94.3 kg, SpO2 96 %.    Affect appropriate Chronically ill male  HEENT: normal Neck supple with no adenopathy JVP normal no bruits no thyromegaly Lungs clear with no wheezing and good diaphragmatic motion Heart:  S1/S2 2/6 SEM  murmur, no rub, gallop or click PMI normal Abdomen: benighn, BS positve, no tenderness, no AAA no bruit.  No HSM or HJR Distal pulses intact with no bruits Plus one bilateral edema Neuro non-focal Skin warm and dry Diffuse UE weakness left sided hip pain   Labs:   Lab Results  Component Value Date   WBC 10.0 07/24/2020   HGB 10.5 (L) 07/24/2020   HCT 29.8 (L) 07/24/2020   MCV 93 07/24/2020   PLT 254 07/24/2020   No results for input(s): NA, K, CL, CO2, BUN, CREATININE, CALCIUM, PROT, BILITOT, ALKPHOS, ALT, AST, GLUCOSE in the last 168 hours.  Invalid input(s): LABALBU Lab Results  Component Value Date   CKTOTAL 67 10/13/2020    Lab Results  Component Value Date   CHOL 145 07/19/2020   CHOL 118 01/19/2020   CHOL 134 07/22/2019   Lab Results  Component Value Date   HDL 52 07/19/2020   HDL 59 01/19/2020   HDL 38 (L) 07/22/2019   Lab Results  Component Value Date   LDLCALC 70 07/19/2020   LDLCALC 47 01/19/2020   LDLCALC 72 07/22/2019   Lab Results  Component Value Date   TRIG 134 07/19/2020   TRIG 54 01/19/2020    TRIG 122 07/22/2019   Lab Results  Component Value Date   CHOLHDL 2.8 07/19/2020   CHOLHDL 2.0 01/19/2020   CHOLHDL 3.5 07/22/2019   No results found for: LDLDIRECT    Radiology: No results found.  EKG: SR rate 90 PVC otherwise normal  12/29/2020 SR rate 88 normal    ASSESSMENT AND PLAN:   1. Murmur:  Benign see echo results 02/29/20 in HPI no need for  SBE   2. HTN:  Well controlled.  Continue current medications and low sodium Dash type diet.    3. DM:  Discussed low carb diet.  Target hemoglobin A1c is 6.5 or less.  Continue current medications.  4. Myesthenia:  F/u neurology on mestinon and low dose steroids  5. RA:  Arthritis stable on methotrexate Has had IVIG and imuran in past as well   6. CRF:  Cr 1.71 likely due to HTN/DM f/u primary   7. CAD:  Risk with poorly controlled DM, cigar smoking , HTN  lexiscan myovue normal 02/29/20   8. Preop:  Clear to have orthopedic surgery with Dr Roda Shutters  F/U in a year   Signed: Charlton Haws 12/29/2020, 11:20 AM

## 2020-12-25 ENCOUNTER — Encounter: Payer: Self-pay | Admitting: Family Medicine

## 2020-12-25 ENCOUNTER — Ambulatory Visit: Payer: Medicare PPO | Admitting: Family Medicine

## 2020-12-25 ENCOUNTER — Other Ambulatory Visit: Payer: Self-pay

## 2020-12-25 VITALS — BP 130/76 | HR 94 | Temp 97.8°F | Ht 69.0 in | Wt 202.0 lb

## 2020-12-25 DIAGNOSIS — H532 Diplopia: Secondary | ICD-10-CM | POA: Diagnosis not present

## 2020-12-25 DIAGNOSIS — R609 Edema, unspecified: Secondary | ICD-10-CM

## 2020-12-25 DIAGNOSIS — I10 Essential (primary) hypertension: Secondary | ICD-10-CM

## 2020-12-25 DIAGNOSIS — M25551 Pain in right hip: Secondary | ICD-10-CM

## 2020-12-25 DIAGNOSIS — E1122 Type 2 diabetes mellitus with diabetic chronic kidney disease: Secondary | ICD-10-CM | POA: Diagnosis not present

## 2020-12-25 DIAGNOSIS — H40053 Ocular hypertension, bilateral: Secondary | ICD-10-CM | POA: Diagnosis not present

## 2020-12-25 DIAGNOSIS — H04123 Dry eye syndrome of bilateral lacrimal glands: Secondary | ICD-10-CM | POA: Diagnosis not present

## 2020-12-25 LAB — HM DIABETES EYE EXAM

## 2020-12-25 MED ORDER — LOSARTAN POTASSIUM 100 MG PO TABS: 100.0000 mg | ORAL_TABLET | Freq: Every day | ORAL | 1 refills | Status: DC

## 2020-12-25 NOTE — Progress Notes (Signed)
Subjective:  Patient ID: Jonathan Mcgee, male    DOB: 1951-05-16  Age: 70 y.o. MRN: [id number]  CC:  Chief Complaint  Patient presents with  . Follow-up    On diabetes. PT wants to discuss A1C. PT needs to bring it down to 7.8 or lower so he can have his surgery. PT wants to discuss ways to bring it down. PT states his average BS readings at home have been around 139 mg/dl. PT is hoping to scheduled the surgery at the end of February. Pt states that his Prednisone dose has been lowered since last OV. PT sent [place] a mychart message about all this recently. PT also want to let the provider known his swelling in his lower extremities has gone down. Need Lasix?    HPI Jonathan Mcgee presents for  Diabetes: Complicated by CKD, hyperglycemia.  Has been on prednisone with history of myasthenia gravis.  Has had some lowering of doses.  Under the care of rheumatology and neurology. Plan for R Hip surgery, Dr. Roda Shutters. Needs to have A1c under 7.8. walks with cane.  Previously restarted glipizide 2.5 mg daily in addition to his metformin 1000 mg twice daily with increase in A1c's.  Option of full 5 mg dose discussed at December visit.- increased to 5mg  a week ago.  Last 30 day average 139. Occasional readings in 300's few weeks ago - was lowering prednisone form 60-40mg  at that time. Now on 30mg  prednisone in past week. Swelling better on lower dose prednisone.  No recent elevated readings in 200's.  Lowest in 90's - no symptomatic lows.     Lab Results  Component Value Date   HGBA1C 8.1 (H) 10/13/2020   HGBA1C 7.2 (H) 07/19/2020   HGBA1C 5.9 (H) 01/19/2020   Lab Results  Component Value Date   LDLCALC 70 07/19/2020   CREATININE 1.70 (H) 11/10/2020   Peripheral edema: Discussed in December, lower dose of amlodipine ordered, recommended continue compression stockings.  Previous BNP reassuring.  Continued cardiology follow-up recommended as well. Swelling better on lower dose prednisone - no change in  swelling on lower dose amlodipine. Wearing compression stockings.   Still taking lasix 20mg  qd.   History Patient Active Problem List   Diagnosis Date Noted  . DISH (diffuse idiopathic skeletal hyperostosis) 11/29/2020  . Primary osteoarthritis of right hip 11/14/2020  . Rheumatoid arthritis (HCC) 09/23/2019  . Morbid obesity (HCC) 07/19/2019  . UTI (urinary tract infection) 02/20/2019  . Suspected COVID-19 virus infection 02/20/2019  . Type 2 diabetes mellitus (HCC) 02/20/2019  . Sepsis (HCC) 02/19/2019  . CKD (chronic kidney disease) 04/12/2017  . Essential hypertension 01/13/2017  . Hyperlipidemia 01/13/2017  . Myasthenia gravis (HCC) 08/28/2016   Past Medical History:  Diagnosis Date  . Arthritis   . Diabetes mellitus without complication (HCC)   . Hypercholesterolemia   . Hypertension   . Myasthenia gravis (HCC)   . Rheumatoid arthritis [facility])    Past Surgical History:  Procedure Laterality Date  . CATARACT EXTRACTION, BILATERAL  2020  . KNEE SURGERY Right    fractured patella > 20 yrs ago  . PENILE PROSTHESIS IMPLANT  2015   Allergies  Allergen Reactions  . Lisinopril Swelling    Facial swelling   Prior to Admission medications   Medication Sig Start Date End Date Taking? Authorizing Provider  amLODipine (NORVASC) 5 MG tablet TAKE 1 & 1/2 (ONE & ONE-HALF) TABLETS BY MOUTH ONCE DAILY 10/23/20  Yes 10/28/2016, MD  atorvastatin (LIPITOR)  20 MG tablet Take 1 tablet (20 mg total) by mouth daily. 08/21/20  Yes Shade Flood, MD  folic acid (FOLVITE) 1 MG tablet Take 1 tablet (1 mg total) by mouth daily. 10/13/20  Yes Shade Flood, MD  furosemide (LASIX) 20 MG tablet Take 1 tablet (20 mg total) by mouth daily. 10/13/20  Yes Shade Flood, MD  glipiZIDE (GLUCOTROL) 5 MG tablet Take 0.5 tablets (2.5 mg total) by mouth daily before breakfast. 01/19/20  Yes Shade Flood, MD  hydrochlorothiazide (HYDRODIURIL) 25 MG tablet Take 1 tablet (25 mg total) by  mouth daily. 08/21/20  Yes Shade Flood, MD  losartan (COZAAR) 100 MG tablet Take 1 tablet (100 mg total) by mouth daily. 08/21/20  Yes Shade Flood, MD  metFORMIN (GLUCOPHAGE) 1000 MG tablet Take 1 tablet (1,000 mg total) by mouth 2 (two) times daily with a meal. 08/21/20  Yes Shade Flood, MD  predniSONE (DELTASONE) 10 MG tablet Take 3 tablets (30 mg total) by mouth daily with breakfast. 12/20/20  Yes Penumalli, Glenford Bayley, MD  pyridostigmine (MESTINON) 60 MG tablet TAKE 1 TABLET BY MOUTH THREE TIMES DAILY 09/11/20  Yes Penumalli, Glenford Bayley, MD   Social History   Socioeconomic History  . Marital status: Single    Spouse name: Not on file  . Number of children: 1  . Years of education: 64  . Highest education level: Not on file  Occupational History    Comment: [employer]  Tobacco Use  . Smoking status: Current Some Day Smoker    Packs/day: 1.00    Types: Cigars  . Smokeless tobacco: Never Used  Vaping Use  . Vaping Use: Never used  Substance and Sexual Activity  . Alcohol use: Yes    Comment: occas  . Drug use: No  . Sexual activity: Not on file  Other Topics Concern  . Not on file  Social History Narrative   Drinks 2-3 caffeine drinks a day    Left handed   One story    Social Determinants of Health   Financial Resource Strain: Not on file  Food Insecurity: Not on file  Transportation Needs: Not on file  Physical Activity: Not on file  Stress: Not on file  Social Connections: Not on file  Intimate Partner Violence: Not on file    Review of Systems Per HPI  Objective:   Vitals:   12/25/20 1112 12/25/20 1117  BP: (!) 163/73 130/76  Pulse: 94   Temp: 97.8 F (36.6 C)   TempSrc: Temporal   SpO2: 98%   Weight: 202 lb (91.6 kg)   Height: 5\' 9"  (1.753 m)      Physical Exam Vitals reviewed.  Constitutional:      Appearance: He is well-developed and well-nourished.  HENT:     Head: Normocephalic and atraumatic.  Eyes:     Extraocular Movements: EOM  normal.     Pupils: Pupils are equal, round, and reactive to light.  Neck:     Vascular: No carotid bruit or JVD.  Cardiovascular:     Rate and Rhythm: Normal rate and regular rhythm.     Heart sounds: Normal heart sounds. No murmur heard.   Pulmonary:     Effort: Pulmonary effort is normal.     Breath sounds: Normal breath sounds. No rales.  Musculoskeletal:        General: No edema.     Right lower leg: No edema.     Left lower leg:  No edema.  Skin:    General: Skin is warm and dry.  Neurological:     Mental Status: He is alert and oriented to person, place, and time.  Psychiatric:        Mood and Affect: Mood and affect and mood normal.        Behavior: Behavior normal.     Assessment & Plan:  Jonathan Mcgee is a 70 y.o. male . Right hip pain  - plan for THR, will need A1c under 7.8 by report.  Essential hypertension - Plan: losartan (COZAAR) 100 MG tablet, Comprehensive metabolic panel  - stable, continue same regimen. labs pending. Option of higher dose amlodipine as min change to edema.   Type 2 diabetes mellitus with chronic kidney disease, without long-term current use of insulin, unspecified CKD stage (HCC) - Plan: Comprehensive metabolic panel, Lipid panel, Hemoglobin A1c  -  Anticipate improved a1c with current regimen. If stable on 3 week labs, schedule preop visit. May need to decrease glipizide as prednisone decreases.   Peripheral edema  - improved. Continue stockings, can wean lasix to 1/2 dose or alternating days full dose. rtc precuations.   Meds ordered this encounter  Medications  . losartan (COZAAR) 100 MG tablet    Sig: Take 1 tablet (100 mg total) by mouth daily.    Dispense:  90 tablet    Refill:  1   Patient Instructions    Can increase amlodipine back to 10 mg per day if easier (let me know if leg swelling worsens).  Continue glipizide  per day. If any lows, return to 1/2 dose. No change in metformin for now.  3 week lab visit for A1c.  If below 7.8 - schedule preop visit with me, advise Dr. Warren Danes office to send me clearance paperwork/form and cardiac clearance can be provided by your cardiologist.   I expect swelling to continue to do well. Can try decreasing lasix to half pill per day or take 1 every other day. If swelling worsens, can return to daily use.      If you have lab work done today you will be contacted with your lab results within the next 2 weeks.  If you have not heard from [place] then please contact us. The fastest way to get your results is to register for My Chart.   IF you received an x-ray today, you will receive an invoice from [facility]. Please contact [facility]iology at [phone] with questions or concerns regarding your invoice.   IF you received labwork today, you will receive an invoice from [facility]. Please contact [facility] at [phone] with questions or concerns regarding your invoice.   Our billing staff will not be able to assist you with questions regarding bills from these companies.  You will be contacted with the lab results as soon as they are available. The fastest way to get your results is to activate your My Chart account. Instructions are located on the last page of this paperwork. If you have not heard from [place] regarding the results in 2 weeks, please contact this office.          Signed, [name], MD [facility]

## 2020-12-25 NOTE — Patient Instructions (Addendum)
  Can increase amlodipine back to 10 mg per day if easier (let me know if leg swelling worsens).  Continue glipizide 5mg  per day. If any lows, return to 1/2 dose. No change in metformin for now.  3 week lab visit for A1c. If below 7.8 - schedule preop visit with me, advise Dr. office to send me clearance paperwork/form and cardiac clearance can be provided by your cardiologist.   I expect swelling to continue to do well. Can try decreasing lasix to half pill per day or take 1 every other day. If swelling worsens, can return to daily use.      If you have lab work done today you will be contacted with your lab results within the next 2 weeks.  If you have not heard from Warren Danes then please contact us. The fastest way to get your results is to register for My Chart.   IF you received an x-ray today, you will receive an invoice from [facility]. Please contact [facility] Radiology at [phone] with questions or concerns regarding your invoice.   IF you received labwork today, you will receive an invoice from [facility]. Please contact [facility] at [phone] with questions or concerns regarding your invoice.   Our billing staff will not be able to assist you with questions regarding bills from these companies.  You will be contacted with the lab results as soon as they are available. The fastest way to get your results is to activate your My Chart account. Instructions are located on the last page of this paperwork. If you have not heard from [phone] regarding the results in 2 weeks, please contact this office.

## 2020-12-29 ENCOUNTER — Encounter: Payer: Self-pay | Admitting: Cardiovascular Disease

## 2020-12-29 ENCOUNTER — Ambulatory Visit: Payer: Medicare PPO | Admitting: Cardiovascular Disease

## 2020-12-29 VITALS — BP 142/56 | HR 88 | Ht 69.0 in | Wt 208.0 lb

## 2020-12-29 DIAGNOSIS — R011 Cardiac murmur, unspecified: Secondary | ICD-10-CM | POA: Diagnosis not present

## 2020-12-29 DIAGNOSIS — Z01810 Encounter for preprocedural cardiovascular examination: Secondary | ICD-10-CM

## 2020-12-29 DIAGNOSIS — I10 Essential (primary) hypertension: Secondary | ICD-10-CM

## 2020-12-29 NOTE — Patient Instructions (Signed)

## 2021-01-15 ENCOUNTER — Ambulatory Visit: Payer: Medicare PPO

## 2021-01-15 ENCOUNTER — Other Ambulatory Visit: Payer: Self-pay

## 2021-01-15 DIAGNOSIS — I10 Essential (primary) hypertension: Secondary | ICD-10-CM | POA: Diagnosis not present

## 2021-01-15 DIAGNOSIS — E1122 Type 2 diabetes mellitus with diabetic chronic kidney disease: Secondary | ICD-10-CM

## 2021-01-15 LAB — COMPREHENSIVE METABOLIC PANEL
ALT: 13 IU/L (ref 0–44)
AST: 10 IU/L (ref 0–40)
Albumin/Globulin Ratio: 2 (ref 1.2–2.2)
Albumin: 4.3 g/dL (ref 3.8–4.8)
Alkaline Phosphatase: 51 IU/L (ref 44–121)
BUN/Creatinine Ratio: 12 (ref 10–24)
BUN: 19 mg/dL (ref 8–27)
Bilirubin Total: 0.2 mg/dL (ref 0.0–1.2)
CO2: 21 mmol/L (ref 20–29)
Calcium: 9.4 mg/dL (ref 8.6–10.2)
Chloride: 101 mmol/L (ref 96–106)
Creatinine, Ser: 1.6 mg/dL — ABNORMAL HIGH (ref 0.76–1.27)
GFR calc Af Amer: 50 mL/min/{1.73_m2} — ABNORMAL LOW (ref >59)
GFR calc non Af Amer: 43 mL/min/{1.73_m2} — ABNORMAL LOW (ref >59)
Globulin, Total: 2.1 g/dL (ref 1.5–4.5)
Glucose: 47 mg/dL — ABNORMAL LOW (ref 65–99)
Potassium: 3.8 mmol/L (ref 3.5–5.2)
Sodium: 146 mmol/L — ABNORMAL HIGH (ref 134–144)
Total Protein: 6.4 g/dL (ref 6.0–8.5)

## 2021-01-15 LAB — LIPID PANEL
Chol/HDL Ratio: 2.3 ratio (ref 0.0–5.0)
Cholesterol, Total: 158 mg/dL (ref 100–199)
HDL: 70 mg/dL (ref >39)
LDL Chol Calc (NIH): 70 mg/dL (ref 0–99)
Triglycerides: 103 mg/dL (ref 0–149)
VLDL Cholesterol Cal: 18 mg/dL (ref 5–40)

## 2021-01-15 LAB — HEMOGLOBIN A1C
Est. average glucose Bld gHb Est-mCnc: 157 mg/dL
Hgb A1c MFr Bld: 7.1 % — ABNORMAL HIGH (ref 4.8–5.6)

## 2021-01-16 ENCOUNTER — Encounter: Payer: Self-pay | Admitting: Orthopaedic Surgery

## 2021-01-16 ENCOUNTER — Encounter: Payer: Self-pay | Admitting: Family Medicine

## 2021-01-16 NOTE — Telephone Encounter (Signed)
Pt requesting authorization for surgery now that lab results are back and A1c is 7.1

## 2021-01-18 ENCOUNTER — Other Ambulatory Visit: Payer: Self-pay | Admitting: Family Medicine

## 2021-01-18 DIAGNOSIS — I10 Essential (primary) hypertension: Secondary | ICD-10-CM

## 2021-01-22 ENCOUNTER — Ambulatory Visit: Payer: Medicare PPO | Admitting: Diagnostic Neuroimaging

## 2021-01-22 ENCOUNTER — Ambulatory Visit: Payer: Medicare PPO | Admitting: Family Medicine

## 2021-01-24 ENCOUNTER — Other Ambulatory Visit: Payer: Self-pay

## 2021-01-25 ENCOUNTER — Other Ambulatory Visit (HOSPITAL_COMMUNITY)
Admission: RE | Admit: 2021-01-25 | Discharge: 2021-01-25 | Disposition: A | Discharge status: Home / Self Care | Payer: Medicare PPO | Source: Ambulatory Visit | Attending: Orthopaedic Surgery | Admitting: Orthopaedic Surgery

## 2021-01-25 ENCOUNTER — Other Ambulatory Visit: Payer: Self-pay

## 2021-01-25 ENCOUNTER — Encounter (HOSPITAL_COMMUNITY)
Admission: RE | Admit: 2021-01-25 | Discharge: 2021-01-25 | Disposition: A | Discharge status: Home / Self Care | Payer: Medicare PPO | Source: Ambulatory Visit | Attending: Orthopaedic Surgery | Admitting: Orthopaedic Surgery

## 2021-01-25 ENCOUNTER — Encounter (HOSPITAL_COMMUNITY): Payer: Self-pay

## 2021-01-25 DIAGNOSIS — Z20822 Contact with and (suspected) exposure to covid-19: Secondary | ICD-10-CM | POA: Insufficient documentation

## 2021-01-25 DIAGNOSIS — Z01812 Encounter for preprocedural laboratory examination: Secondary | ICD-10-CM | POA: Diagnosis not present

## 2021-01-25 LAB — COMPREHENSIVE METABOLIC PANEL
ALT: 14 U/L (ref 0–44)
AST: 18 U/L (ref 15–41)
Albumin: 3.7 g/dL (ref 3.5–5.0)
Alkaline Phosphatase: 46 U/L (ref 38–126)
Anion gap: 13 (ref 5–15)
BUN: 27 mg/dL — ABNORMAL HIGH (ref 8–23)
CO2: 22 mmol/L (ref 22–32)
Calcium: 9.2 mg/dL (ref 8.9–10.3)
Chloride: 106 mmol/L (ref 98–111)
Creatinine, Ser: 1.71 mg/dL — ABNORMAL HIGH (ref 0.61–1.24)
GFR, Estimated: 43 mL/min — ABNORMAL LOW (ref >60)
Glucose, Bld: 77 mg/dL (ref 70–99)
Potassium: 3.8 mmol/L (ref 3.5–5.1)
Sodium: 141 mmol/L (ref 135–145)
Total Bilirubin: 0.7 mg/dL (ref 0.3–1.2)
Total Protein: 6.6 g/dL (ref 6.5–8.1)

## 2021-01-25 LAB — CBC WITH DIFFERENTIAL/PLATELET
Abs Immature Granulocytes: 0.16 10*3/uL — ABNORMAL HIGH (ref 0.00–0.07)
Basophils Absolute: 0 10*3/uL (ref 0.0–0.1)
Basophils Relative: 0 %
Eosinophils Absolute: 0.1 10*3/uL (ref 0.0–0.5)
Eosinophils Relative: 1 %
HCT: 32.9 % — ABNORMAL LOW (ref 39.0–52.0)
Hemoglobin: 10.5 g/dL — ABNORMAL LOW (ref 13.0–17.0)
Immature Granulocytes: 1 %
Lymphocytes Relative: 4 %
Lymphs Abs: 0.5 10*3/uL — ABNORMAL LOW (ref 0.7–4.0)
MCH: 31.6 pg (ref 26.0–34.0)
MCHC: 31.9 g/dL (ref 30.0–36.0)
MCV: 99.1 fL (ref 80.0–100.0)
Monocytes Absolute: 0.6 10*3/uL (ref 0.1–1.0)
Monocytes Relative: 4 %
Neutro Abs: 12 10*3/uL — ABNORMAL HIGH (ref 1.7–7.7)
Neutrophils Relative %: 90 %
Platelets: 257 10*3/uL (ref 150–400)
RBC: 3.32 MIL/uL — ABNORMAL LOW (ref 4.22–5.81)
RDW: 13.7 % (ref 11.5–15.5)
WBC: 13.4 10*3/uL — ABNORMAL HIGH (ref 4.0–10.5)
nRBC: 0 % (ref 0.0–0.2)

## 2021-01-25 LAB — TYPE AND SCREEN
ABO/RH(D): B POS
Antibody Screen: NEGATIVE

## 2021-01-25 LAB — GLUCOSE, CAPILLARY: Glucose-Capillary: 78 mg/dL (ref 70–99)

## 2021-01-25 LAB — SURGICAL PCR SCREEN
MRSA, PCR: POSITIVE — AB
Staphylococcus aureus: POSITIVE — AB

## 2021-01-25 LAB — URINALYSIS, ROUTINE W REFLEX MICROSCOPIC
Bilirubin Urine: NEGATIVE
Glucose, UA: NEGATIVE mg/dL
Hgb urine dipstick: NEGATIVE
Ketones, ur: NEGATIVE mg/dL
Leukocytes,Ua: NEGATIVE
Nitrite: NEGATIVE
Protein, ur: NEGATIVE mg/dL
Specific Gravity, Urine: 1.017 (ref 1.005–1.030)
pH: 5 (ref 5.0–8.0)

## 2021-01-25 LAB — APTT: aPTT: 25 seconds (ref 24–36)

## 2021-01-25 LAB — PROTIME-INR
INR: 0.9 (ref 0.8–1.2)
Prothrombin Time: 12 seconds (ref 11.4–15.2)

## 2021-01-25 NOTE — Progress Notes (Signed)
Your procedure is scheduled on Monday January 29, 2021.  Report to [facility] Main Entrance "A" at 07:45 A.M., and check in at the Admitting office.  Call this number if you have problems the morning of surgery: [phone]  Call [phone] if you have any questions prior to your surgery date Monday-Friday 8am-4pm   Remember: Do not eat after midnight the night before your surgery  You may drink clear liquids until 06:45 A.M. the morning of your surgery.   Clear liquids allowed are: Water, Non-Citrus Juices (without pulp), Carbonated Beverages, Clear Tea, Black Coffee Only, and Gatorade   Please complete your PRE-SURGERY 10 oz water  that was provided to you by 06:45 A.M. the morning of your surgery. Please, if able, drink it in one setting. DO NOT SIP.  Take these medicines the morning of surgery with A SIP OF WATER: amLODipine (NORVASC)  atorvastatin (LIPITOR)  predniSONE (DELTASONE)  pyridostigmine (MESTINON)    DIABETES INSTRUCTIONS:  ** PLEASE check your blood sugar the morning of your surgery when you wake up and every 2 hours until you get to the Short Stay unit.  If your blood sugar is less than 70 mg/dL, you will need to treat for low blood sugar: - Do not take insulin. - Treat a low blood sugar (less than 70 mg/dL) with  cup of clear juice (cranberry or apple), 4 glucose tablets, OR glucose gel. - Recheck blood sugar in 15 minutes after treatment (to make sure it is greater than 70 mg/dL). If your blood sugar is not greater than 70 mg/dL on recheck, call [phone] for further instructions.  glipiZIDE (GLUCOTROL)  3/6: day before surgery -- take morning dose, NO BEDTIME dose 3/7: NONE  metFORMIN (GLUCOPHAGE)  DO NOT take any morning of surgery  **DO NOT TAKE ANY ORAL DIABETIC MEDICATIONS DAY OF SURGERY**   As of today, STOP taking any Aspirin (unless otherwise instructed by your surgeon), Aleve, Naproxen, Ibuprofen, Motrin, Advil, Goody's, BC's, all herbal  medications, fish oil, and all vitamins.    The Morning of Surgery  Do not wear jewelry  Do not wear lotions, powders, or perfumes, or deodorant  Men may shave face and neck.  Do not bring valuables to the hospital.  [facility] is not responsible for any belongings or valuables.  If you are a smoker, DO NOT Smoke 24 hours prior to surgery  If you wear a CPAP at night please bring your mask the morning of surgery   Remember that you must have someone to transport you home after your surgery, and remain with you for 24 hours if you are discharged the same day.   Please bring cases for contacts, glasses, hearing aids, dentures or bridgework because it cannot be worn into surgery.    Leave your suitcase in the car.  After surgery it may be brought to your room.  For patients admitted to the hospital, discharge time will be determined by your treatment team.  Patients discharged the day of surgery will not be allowed to drive home.    Special instructions:   [facility]- Preparing For Surgery  Before surgery, you can play an important role. Because skin is not sterile, your skin needs to be as free of germs as possible. You can reduce the number of germs on your skin by washing with CHG (chlorahexidine gluconate) Soap before surgery.  CHG is an antiseptic cleaner which kills germs and bonds with the skin to continue killing germs even after  washing.    Oral Hygiene is also important to reduce your risk of infection.  Remember - BRUSH YOUR TEETH THE MORNING OF SURGERY WITH YOUR REGULAR TOOTHPASTE  Please do not use if you have an allergy to CHG or antibacterial soaps. If your skin becomes reddened/irritated stop using the CHG.  Do not shave (including legs and underarms) for at least 48 hours prior to first CHG shower. It is OK to shave your face.  Please follow these instructions carefully.   1. Shower the NIGHT BEFORE SURGERY and the MORNING OF SURGERY with CHG Soap.   2. If you  chose to wash your hair and body, wash as usual with your normal shampoo and body-wash/soap.  3. Rinse your hair and body thoroughly to remove the shampoo and soap.  4. Apply CHG directly to the skin (ONLY FROM THE NECK DOWN) and wash gently with a scrungie or a clean washcloth.   5. Do not use on open wounds or open sores. Avoid contact with your eyes, ears, mouth and genitals (private parts). Wash Face and genitals (private parts)  with your normal soap.   6. Wash thoroughly, paying special attention to the area where your surgery will be performed.  7. Thoroughly rinse your body with warm water from the neck down.  8. DO NOT shower/wash with your normal soap after using and rinsing off the CHG Soap.  9. Pat yourself dry with a CLEAN TOWEL.  10. Wear CLEAN PAJAMAS to bed the night before surgery  11. Place CLEAN SHEETS on your bed the night of your first shower and DO NOT SLEEP WITH PETS.  12. Wear comfortable clothes the morning of surgery.     Day of Surgery:  Please shower the morning of surgery with the CHG soap Do not apply any deodorants/lotions. Please wear clean clothes to the hospital/surgery center.   Remember to brush your teeth WITH YOUR REGULAR TOOTHPASTE.   Please read over the following fact sheets that you were given.

## 2021-01-25 NOTE — Progress Notes (Signed)
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  Please add vanc.  Thanks.

## 2021-01-25 NOTE — Progress Notes (Signed)
PCP - [name], MD Cardiologist - Dr. Eden Emms   Chest x-ray -  EKG - 12/29/20 Stress Test - 02/29/20 ECHO - 02/29/20 Cardiac Cath - denies  Sleep Study - yes CPAP - pt does not use CPAP "it's too expensive"   Fasting Blood Sugar - 120s Checks Blood Sugar 3 times a day  ERAS Protcol - yes PRE-SURGERY Ensure or G2- water  COVID TEST- 01/25/21   Anesthesia review: yes  Patient denies shortness of breath, fever, cough and chest pain at PAT appointment   All instructions explained to the patient, with a verbal understanding of the material. Patient agrees to go over the instructions while at home for a better understanding. Patient also instructed to self quarantine after being tested for COVID-19. The opportunity to ask questions was provided.

## 2021-01-26 ENCOUNTER — Telehealth: Payer: Self-pay | Admitting: *Deleted

## 2021-01-26 ENCOUNTER — Other Ambulatory Visit: Payer: Self-pay | Admitting: Physician Assistant

## 2021-01-26 LAB — SARS CORONAVIRUS 2 (TAT 6-24 HRS): SARS Coronavirus 2: NEGATIVE

## 2021-01-26 MED ORDER — ONDANSETRON HCL 4 MG PO TABS: 4.0000 mg | ORAL_TABLET | Freq: Three times a day (TID) | ORAL | 0 refills | Status: DC | PRN

## 2021-01-26 MED ORDER — DOCUSATE SODIUM 100 MG PO CAPS
100.0000 mg | ORAL_CAPSULE | Freq: Every day | ORAL | 2 refills | Status: DC | PRN
Start: 2021-01-26 — End: 2021-08-13

## 2021-01-26 MED ORDER — OXYCODONE-ACETAMINOPHEN 5-325 MG PO TABS: 1.0000 | ORAL_TABLET | Freq: Four times a day (QID) | ORAL | 0 refills | Status: DC | PRN

## 2021-01-26 MED ORDER — VANCOMYCIN HCL 1000 MG/200ML IV SOLN
1000.0000 mg | INTRAVENOUS | Status: DC
  Filled 2021-01-26: qty 200

## 2021-01-26 MED ORDER — METHOCARBAMOL 500 MG PO TABS: 500.0000 mg | ORAL_TABLET | Freq: Two times a day (BID) | ORAL | 0 refills | Status: DC | PRN

## 2021-01-26 MED ORDER — CEPHALEXIN 500 MG PO CAPS: 500.0000 mg | ORAL_CAPSULE | Freq: Four times a day (QID) | ORAL | 0 refills | Status: DC

## 2021-01-26 MED ORDER — TRANEXAMIC ACID 1000 MG/10ML IV SOLN
2000.0000 mg | INTRAVENOUS | Status: AC
  Administered 2021-01-29: 2000 mg via TOPICAL
  Filled 2021-01-26: qty 20

## 2021-01-26 MED ORDER — ASPIRIN EC 81 MG PO TBEC: 81.0000 mg | DELAYED_RELEASE_TABLET | Freq: Two times a day (BID) | ORAL | 0 refills | Status: DC

## 2021-01-26 NOTE — Care Plan (Signed)
RNCM call to patient to discuss his upcoming Right total hip arthroplasty with Dr. Roda Shutters on 01/29/21. He is an Ortho bundle patient through [facility] and is agreeable to case management. He has a friend and his son that will be assisting after discharge home. He has a FWW, but would need a 3in1/BSC for home use. This will be ordered through [facility]. Referral to [facility] for PT after discharge. Choice provided. Reviewed all post op instructions. Will continue to follow for needs.

## 2021-01-26 NOTE — Telephone Encounter (Signed)
Ortho bundle pre-op call completed. 

## 2021-01-29 ENCOUNTER — Ambulatory Visit (HOSPITAL_COMMUNITY): Payer: Medicare PPO | Admitting: Anesthesiology

## 2021-01-29 ENCOUNTER — Other Ambulatory Visit: Payer: Self-pay

## 2021-01-29 ENCOUNTER — Encounter (HOSPITAL_COMMUNITY)
Admission: RE | Disposition: A | Discharge status: Home / Self Care | Payer: Self-pay | Source: Ambulatory Visit | Attending: Orthopaedic Surgery

## 2021-01-29 ENCOUNTER — Ambulatory Visit (HOSPITAL_COMMUNITY): Payer: Medicare PPO

## 2021-01-29 ENCOUNTER — Encounter (HOSPITAL_COMMUNITY): Payer: Self-pay | Admitting: Orthopaedic Surgery

## 2021-01-29 ENCOUNTER — Observation Stay (HOSPITAL_COMMUNITY): Payer: Medicare PPO

## 2021-01-29 ENCOUNTER — Observation Stay (HOSPITAL_COMMUNITY)
Admission: RE | Admit: 2021-01-29 | Discharge: 2021-01-31 | Disposition: A | Discharge status: Home / Self Care | Payer: Medicare PPO | Source: Ambulatory Visit | Attending: Orthopaedic Surgery | Admitting: Orthopaedic Surgery

## 2021-01-29 DIAGNOSIS — Z7984 Long term (current) use of oral hypoglycemic drugs: Secondary | ICD-10-CM | POA: Diagnosis not present

## 2021-01-29 DIAGNOSIS — I129 Hypertensive chronic kidney disease with stage 1 through stage 4 chronic kidney disease, or unspecified chronic kidney disease: Secondary | ICD-10-CM | POA: Diagnosis not present

## 2021-01-29 DIAGNOSIS — Z79899 Other long term (current) drug therapy: Secondary | ICD-10-CM | POA: Diagnosis not present

## 2021-01-29 DIAGNOSIS — E119 Type 2 diabetes mellitus without complications: Secondary | ICD-10-CM | POA: Diagnosis not present

## 2021-01-29 DIAGNOSIS — Z96641 Presence of right artificial hip joint: Secondary | ICD-10-CM

## 2021-01-29 DIAGNOSIS — E1122 Type 2 diabetes mellitus with diabetic chronic kidney disease: Secondary | ICD-10-CM | POA: Diagnosis not present

## 2021-01-29 DIAGNOSIS — I10 Essential (primary) hypertension: Secondary | ICD-10-CM | POA: Insufficient documentation

## 2021-01-29 DIAGNOSIS — N189 Chronic kidney disease, unspecified: Secondary | ICD-10-CM | POA: Diagnosis not present

## 2021-01-29 DIAGNOSIS — M1611 Unilateral primary osteoarthritis, right hip: Principal | ICD-10-CM | POA: Insufficient documentation

## 2021-01-29 DIAGNOSIS — Z419 Encounter for procedure for purposes other than remedying health state, unspecified: Secondary | ICD-10-CM

## 2021-01-29 DIAGNOSIS — Z96649 Presence of unspecified artificial hip joint: Secondary | ICD-10-CM

## 2021-01-29 DIAGNOSIS — F17290 Nicotine dependence, other tobacco product, uncomplicated: Secondary | ICD-10-CM | POA: Insufficient documentation

## 2021-01-29 DIAGNOSIS — Z7982 Long term (current) use of aspirin: Secondary | ICD-10-CM | POA: Insufficient documentation

## 2021-01-29 DIAGNOSIS — S32491A Other specified fracture of right acetabulum, initial encounter for closed fracture: Secondary | ICD-10-CM | POA: Diagnosis not present

## 2021-01-29 DIAGNOSIS — Z471 Aftercare following joint replacement surgery: Secondary | ICD-10-CM | POA: Diagnosis not present

## 2021-01-29 HISTORY — PX: TOTAL HIP ARTHROPLASTY: SHX124

## 2021-01-29 LAB — ABO/RH: ABO/RH(D): B POS

## 2021-01-29 LAB — GLUCOSE, CAPILLARY
Glucose-Capillary: 92 mg/dL (ref 70–99)
Glucose-Capillary: 195 mg/dL — ABNORMAL HIGH (ref 70–99)
Glucose-Capillary: 245 mg/dL — ABNORMAL HIGH (ref 70–99)
Glucose-Capillary: 275 mg/dL — ABNORMAL HIGH (ref 70–99)

## 2021-01-29 SURGERY — ARTHROPLASTY, HIP, TOTAL, ANTERIOR APPROACH
Anesthesia: General | Site: Hip | Laterality: Right

## 2021-01-29 MED ORDER — ORAL CARE MOUTH RINSE: 15.0000 mL | Freq: Once | OROMUCOSAL | Status: AC

## 2021-01-29 MED ORDER — ACETAMINOPHEN 500 MG PO TABS
1000.0000 mg | ORAL_TABLET | Freq: Once | ORAL | Status: AC
  Administered 2021-01-29: 1000 mg via ORAL
  Filled 2021-01-29: qty 2

## 2021-01-29 MED ORDER — BUPIVACAINE-MELOXICAM ER 400-12 MG/14ML IJ SOLN
INTRAMUSCULAR | Status: AC
  Filled 2021-01-29: qty 1

## 2021-01-29 MED ORDER — OXYCODONE HCL ER 10 MG PO T12A
10.0000 mg | EXTENDED_RELEASE_TABLET | Freq: Two times a day (BID) | ORAL | Status: DC
  Administered 2021-01-29 – 2021-01-31 (×4): 10 mg via ORAL
  Filled 2021-01-29 (×4): qty 1

## 2021-01-29 MED ORDER — INSULIN ASPART 100 UNIT/ML ~~LOC~~ SOLN
0.0000 [IU] | Freq: Every day | SUBCUTANEOUS | Status: DC
  Administered 2021-01-29: 3 [IU] via SUBCUTANEOUS
  Administered 2021-01-30: 2 [IU] via SUBCUTANEOUS

## 2021-01-29 MED ORDER — KETOROLAC TROMETHAMINE 15 MG/ML IJ SOLN
INTRAMUSCULAR | Status: AC
  Filled 2021-01-29: qty 1

## 2021-01-29 MED ORDER — AMISULPRIDE (ANTIEMETIC) 5 MG/2ML IV SOLN: 10.0000 mg | Freq: Once | INTRAVENOUS | Status: DC | PRN

## 2021-01-29 MED ORDER — METHOCARBAMOL 1000 MG/10ML IJ SOLN
500.0000 mg | Freq: Four times a day (QID) | INTRAVENOUS | Status: DC | PRN
  Filled 2021-01-29: qty 5

## 2021-01-29 MED ORDER — ACETAMINOPHEN 325 MG PO TABS: 325.0000 mg | ORAL_TABLET | Freq: Four times a day (QID) | ORAL | Status: DC | PRN

## 2021-01-29 MED ORDER — HYDROMORPHONE HCL 1 MG/ML IJ SOLN: 0.5000 mg | INTRAMUSCULAR | Status: DC | PRN

## 2021-01-29 MED ORDER — FUROSEMIDE 20 MG PO TABS
20.0000 mg | ORAL_TABLET | Freq: Every day | ORAL | Status: DC
  Administered 2021-01-29 – 2021-01-31 (×3): 20 mg via ORAL
  Filled 2021-01-29 (×3): qty 1

## 2021-01-29 MED ORDER — OXYCODONE HCL 5 MG PO TABS: 10.0000 mg | ORAL_TABLET | ORAL | Status: DC | PRN

## 2021-01-29 MED ORDER — TRANEXAMIC ACID-NACL 1000-0.7 MG/100ML-% IV SOLN
1000.0000 mg | INTRAVENOUS | Status: AC
  Administered 2021-01-29: 1000 mg via INTRAVENOUS
  Filled 2021-01-29: qty 100

## 2021-01-29 MED ORDER — VANCOMYCIN HCL IN DEXTROSE 1-5 GM/200ML-% IV SOLN
1000.0000 mg | INTRAVENOUS | Status: AC
  Administered 2021-01-29: 1000 mg via INTRAVENOUS
  Filled 2021-01-29: qty 200

## 2021-01-29 MED ORDER — DEXMEDETOMIDINE (PRECEDEX) IN NS 20 MCG/5ML (4 MCG/ML) IV SYRINGE
PREFILLED_SYRINGE | INTRAVENOUS | Status: DC | PRN
  Administered 2021-01-29 (×2): 4 ug via INTRAVENOUS

## 2021-01-29 MED ORDER — 0.9 % SODIUM CHLORIDE (POUR BTL) OPTIME
TOPICAL | Status: DC | PRN
  Administered 2021-01-29: 1000 mL

## 2021-01-29 MED ORDER — METOCLOPRAMIDE HCL 5 MG PO TABS: 5.0000 mg | ORAL_TABLET | Freq: Three times a day (TID) | ORAL | Status: DC | PRN

## 2021-01-29 MED ORDER — VANCOMYCIN HCL 1 G IV SOLR
INTRAVENOUS | Status: DC | PRN
  Administered 2021-01-29: 1000 mg

## 2021-01-29 MED ORDER — HYDROCHLOROTHIAZIDE 25 MG PO TABS
25.0000 mg | ORAL_TABLET | Freq: Every day | ORAL | Status: DC
  Administered 2021-01-29 – 2021-01-31 (×3): 25 mg via ORAL
  Filled 2021-01-29 (×3): qty 1

## 2021-01-29 MED ORDER — PROPOFOL 10 MG/ML IV BOLUS
INTRAVENOUS | Status: DC | PRN
Start: 2021-01-29 — End: 2021-01-29
  Administered 2021-01-29 (×2): 50 mg via INTRAVENOUS

## 2021-01-29 MED ORDER — POLYETHYLENE GLYCOL 3350 17 G PO PACK
17.0000 g | PACK | Freq: Every day | ORAL | Status: DC
  Administered 2021-01-29 – 2021-01-31 (×3): 17 g via ORAL
  Filled 2021-01-29 (×3): qty 1

## 2021-01-29 MED ORDER — TRANEXAMIC ACID-NACL 1000-0.7 MG/100ML-% IV SOLN
1000.0000 mg | Freq: Once | INTRAVENOUS | Status: AC
  Administered 2021-01-29: 1000 mg via INTRAVENOUS
  Filled 2021-01-29: qty 100

## 2021-01-29 MED ORDER — LIDOCAINE 2% (20 MG/ML) 5 ML SYRINGE
INTRAMUSCULAR | Status: DC | PRN
  Administered 2021-01-29: 100 mg via INTRAVENOUS

## 2021-01-29 MED ORDER — BUPIVACAINE-EPINEPHRINE (PF) 0.25% -1:200000 IJ SOLN
INTRAMUSCULAR | Status: AC
  Filled 2021-01-29: qty 20

## 2021-01-29 MED ORDER — INSULIN ASPART 100 UNIT/ML ~~LOC~~ SOLN
0.0000 [IU] | Freq: Three times a day (TID) | SUBCUTANEOUS | Status: DC
  Administered 2021-01-29: 5 [IU] via SUBCUTANEOUS
  Administered 2021-01-30: 2 [IU] via SUBCUTANEOUS
  Administered 2021-01-30: 8 [IU] via SUBCUTANEOUS
  Administered 2021-01-31: 2 [IU] via SUBCUTANEOUS

## 2021-01-29 MED ORDER — FENTANYL CITRATE (PF) 100 MCG/2ML IJ SOLN
25.0000 ug | INTRAMUSCULAR | Status: DC | PRN
  Administered 2021-01-29 (×2): 25 ug via INTRAVENOUS

## 2021-01-29 MED ORDER — CEFAZOLIN SODIUM-DEXTROSE 2-4 GM/100ML-% IV SOLN
2.0000 g | INTRAVENOUS | Status: AC
  Administered 2021-01-29: 2 g via INTRAVENOUS
  Filled 2021-01-29: qty 100

## 2021-01-29 MED ORDER — CHLORHEXIDINE GLUCONATE 0.12 % MT SOLN: 15.0000 mL | Freq: Once | OROMUCOSAL | Status: AC

## 2021-01-29 MED ORDER — DIPHENHYDRAMINE HCL 12.5 MG/5ML PO ELIX: 25.0000 mg | ORAL_SOLUTION | ORAL | Status: DC | PRN

## 2021-01-29 MED ORDER — PYRIDOSTIGMINE BROMIDE 60 MG PO TABS
60.0000 mg | ORAL_TABLET | Freq: Three times a day (TID) | ORAL | Status: DC
Start: 2021-01-29 — End: 2021-01-31
  Administered 2021-01-29 – 2021-01-31 (×6): 60 mg via ORAL
  Filled 2021-01-29 (×8): qty 1

## 2021-01-29 MED ORDER — SORBITOL 70 % SOLN: 30.0000 mL | Freq: Every day | Status: DC | PRN

## 2021-01-29 MED ORDER — MIDAZOLAM HCL 2 MG/2ML IJ SOLN
INTRAMUSCULAR | Status: DC | PRN
  Administered 2021-01-29: 2 mg via INTRAVENOUS

## 2021-01-29 MED ORDER — ASPIRIN 81 MG PO CHEW
81.0000 mg | CHEWABLE_TABLET | Freq: Two times a day (BID) | ORAL | Status: DC
  Administered 2021-01-29 – 2021-01-31 (×4): 81 mg via ORAL
  Filled 2021-01-29 (×4): qty 1

## 2021-01-29 MED ORDER — ONDANSETRON HCL 4 MG PO TABS: 4.0000 mg | ORAL_TABLET | Freq: Four times a day (QID) | ORAL | Status: DC | PRN

## 2021-01-29 MED ORDER — SODIUM CHLORIDE 0.9 % IR SOLN
Status: DC | PRN
  Administered 2021-01-29: 1000 mL

## 2021-01-29 MED ORDER — PHENYLEPHRINE 40 MCG/ML (10ML) SYRINGE FOR IV PUSH (FOR BLOOD PRESSURE SUPPORT)
PREFILLED_SYRINGE | INTRAVENOUS | Status: DC | PRN
  Administered 2021-01-29: 120 ug via INTRAVENOUS

## 2021-01-29 MED ORDER — FENTANYL CITRATE (PF) 250 MCG/5ML IJ SOLN
INTRAMUSCULAR | Status: AC
  Filled 2021-01-29: qty 5

## 2021-01-29 MED ORDER — ALUM & MAG HYDROXIDE-SIMETH 200-200-20 MG/5ML PO SUSP: 30.0000 mL | ORAL | Status: DC | PRN

## 2021-01-29 MED ORDER — DEXAMETHASONE SODIUM PHOSPHATE 10 MG/ML IJ SOLN
10.0000 mg | Freq: Once | INTRAMUSCULAR | Status: AC
  Administered 2021-01-30: 10 mg via INTRAVENOUS
  Filled 2021-01-29: qty 1

## 2021-01-29 MED ORDER — FENTANYL CITRATE (PF) 100 MCG/2ML IJ SOLN
INTRAMUSCULAR | Status: AC
  Filled 2021-01-29: qty 2

## 2021-01-29 MED ORDER — SODIUM CHLORIDE 0.9 % IV SOLN: INTRAVENOUS | Status: DC

## 2021-01-29 MED ORDER — AMLODIPINE BESYLATE 5 MG PO TABS
7.5000 mg | ORAL_TABLET | Freq: Every day | ORAL | Status: DC
  Administered 2021-01-30 – 2021-01-31 (×2): 7.5 mg via ORAL
  Filled 2021-01-29 (×2): qty 1

## 2021-01-29 MED ORDER — MAGNESIUM CITRATE PO SOLN: 1.0000 | Freq: Once | ORAL | Status: DC | PRN

## 2021-01-29 MED ORDER — DOCUSATE SODIUM 100 MG PO CAPS
100.0000 mg | ORAL_CAPSULE | Freq: Two times a day (BID) | ORAL | Status: DC
  Administered 2021-01-29 – 2021-01-31 (×4): 100 mg via ORAL
  Filled 2021-01-29 (×4): qty 1

## 2021-01-29 MED ORDER — LOSARTAN POTASSIUM 50 MG PO TABS
100.0000 mg | ORAL_TABLET | Freq: Every day | ORAL | Status: DC
  Administered 2021-01-29 – 2021-01-31 (×3): 100 mg via ORAL
  Filled 2021-01-29 (×3): qty 2

## 2021-01-29 MED ORDER — LACTATED RINGERS IV SOLN: INTRAVENOUS | Status: DC

## 2021-01-29 MED ORDER — CEFAZOLIN SODIUM-DEXTROSE 2-4 GM/100ML-% IV SOLN
2.0000 g | Freq: Four times a day (QID) | INTRAVENOUS | Status: AC
  Administered 2021-01-29 (×2): 2 g via INTRAVENOUS
  Filled 2021-01-29 (×3): qty 100

## 2021-01-29 MED ORDER — OXYCODONE HCL 5 MG PO TABS
5.0000 mg | ORAL_TABLET | ORAL | Status: DC | PRN
  Administered 2021-01-29 – 2021-01-31 (×3): 5 mg via ORAL
  Filled 2021-01-29 (×2): qty 1

## 2021-01-29 MED ORDER — MIDAZOLAM HCL 2 MG/2ML IJ SOLN
INTRAMUSCULAR | Status: AC
  Filled 2021-01-29: qty 2

## 2021-01-29 MED ORDER — POVIDONE-IODINE 10 % EX SWAB
2.0000 "application " | Freq: Once | CUTANEOUS | Status: AC
  Administered 2021-01-29: 2 via TOPICAL

## 2021-01-29 MED ORDER — KETOROLAC TROMETHAMINE 15 MG/ML IJ SOLN
15.0000 mg | Freq: Four times a day (QID) | INTRAMUSCULAR | Status: AC
  Administered 2021-01-29 – 2021-01-30 (×4): 15 mg via INTRAVENOUS
  Filled 2021-01-29 (×3): qty 1

## 2021-01-29 MED ORDER — FENTANYL CITRATE (PF) 250 MCG/5ML IJ SOLN
INTRAMUSCULAR | Status: DC | PRN
  Administered 2021-01-29: 100 ug via INTRAVENOUS
  Administered 2021-01-29 (×3): 50 ug via INTRAVENOUS

## 2021-01-29 MED ORDER — ONDANSETRON HCL 4 MG/2ML IJ SOLN
INTRAMUSCULAR | Status: DC | PRN
  Administered 2021-01-29: 4 mg via INTRAVENOUS

## 2021-01-29 MED ORDER — IRRISEPT - 450ML BOTTLE WITH 0.05% CHG IN STERILE WATER, USP 99.95% OPTIME
TOPICAL | Status: DC | PRN
  Administered 2021-01-29: 450 mL via TOPICAL

## 2021-01-29 MED ORDER — MENTHOL 3 MG MT LOZG: 1.0000 | LOZENGE | OROMUCOSAL | Status: DC | PRN

## 2021-01-29 MED ORDER — PREDNISONE 20 MG PO TABS
30.0000 mg | ORAL_TABLET | Freq: Every day | ORAL | Status: DC
  Administered 2021-01-30 – 2021-01-31 (×2): 30 mg via ORAL
  Filled 2021-01-29 (×2): qty 1

## 2021-01-29 MED ORDER — CHLORHEXIDINE GLUCONATE 0.12 % MT SOLN
OROMUCOSAL | Status: AC
  Administered 2021-01-29: 15 mL via OROMUCOSAL
  Filled 2021-01-29: qty 15

## 2021-01-29 MED ORDER — GLIPIZIDE 2.5 MG HALF TABLET
2.5000 mg | ORAL_TABLET | Freq: Every day | ORAL | Status: DC
Start: 2021-01-30 — End: 2021-01-31
  Administered 2021-01-31: 2.5 mg via ORAL
  Filled 2021-01-29 (×2): qty 1

## 2021-01-29 MED ORDER — METHOCARBAMOL 500 MG PO TABS
500.0000 mg | ORAL_TABLET | Freq: Four times a day (QID) | ORAL | Status: DC | PRN
  Administered 2021-01-29 – 2021-01-31 (×3): 500 mg via ORAL
  Filled 2021-01-29 (×2): qty 1

## 2021-01-29 MED ORDER — TRANEXAMIC ACID-NACL 1000-0.7 MG/100ML-% IV SOLN
INTRAVENOUS | Status: AC
  Filled 2021-01-29: qty 100

## 2021-01-29 MED ORDER — BUPIVACAINE-MELOXICAM ER 400-12 MG/14ML IJ SOLN
INTRAMUSCULAR | Status: DC | PRN
  Administered 2021-01-29: 400 mg

## 2021-01-29 MED ORDER — ONDANSETRON HCL 4 MG/2ML IJ SOLN: 4.0000 mg | Freq: Four times a day (QID) | INTRAMUSCULAR | Status: DC | PRN

## 2021-01-29 MED ORDER — OXYCODONE HCL 5 MG PO TABS
ORAL_TABLET | ORAL | Status: AC
  Filled 2021-01-29: qty 1

## 2021-01-29 MED ORDER — SUGAMMADEX SODIUM 200 MG/2ML IV SOLN
INTRAVENOUS | Status: DC | PRN
  Administered 2021-01-29: 200 mg via INTRAVENOUS

## 2021-01-29 MED ORDER — PROPOFOL 10 MG/ML IV BOLUS
INTRAVENOUS | Status: AC
  Filled 2021-01-29: qty 20

## 2021-01-29 MED ORDER — METOCLOPRAMIDE HCL 5 MG/ML IJ SOLN: 5.0000 mg | Freq: Three times a day (TID) | INTRAMUSCULAR | Status: DC | PRN

## 2021-01-29 MED ORDER — METFORMIN HCL 500 MG PO TABS
1000.0000 mg | ORAL_TABLET | Freq: Two times a day (BID) | ORAL | Status: DC
  Administered 2021-01-29 – 2021-01-31 (×4): 1000 mg via ORAL
  Filled 2021-01-29 (×4): qty 2

## 2021-01-29 MED ORDER — PHENOL 1.4 % MT LIQD: 1.0000 | OROMUCOSAL | Status: DC | PRN

## 2021-01-29 MED ORDER — PROPOFOL 1000 MG/100ML IV EMUL
INTRAVENOUS | Status: AC
  Filled 2021-01-29: qty 200

## 2021-01-29 MED ORDER — METHOCARBAMOL 500 MG PO TABS
ORAL_TABLET | ORAL | Status: AC
  Filled 2021-01-29: qty 1

## 2021-01-29 MED ORDER — ROCURONIUM BROMIDE 10 MG/ML (PF) SYRINGE
PREFILLED_SYRINGE | INTRAVENOUS | Status: DC | PRN
  Administered 2021-01-29: 100 mg via INTRAVENOUS

## 2021-01-29 MED ORDER — ACETAMINOPHEN 500 MG PO TABS
1000.0000 mg | ORAL_TABLET | Freq: Four times a day (QID) | ORAL | Status: AC
  Administered 2021-01-29 – 2021-01-30 (×4): 1000 mg via ORAL
  Filled 2021-01-29 (×4): qty 2

## 2021-01-29 MED ORDER — CELECOXIB 200 MG PO CAPS
200.0000 mg | ORAL_CAPSULE | Freq: Once | ORAL | Status: AC
  Administered 2021-01-29: 200 mg via ORAL
  Filled 2021-01-29: qty 1

## 2021-01-29 MED ORDER — VANCOMYCIN HCL 1000 MG IV SOLR
INTRAVENOUS | Status: AC
  Filled 2021-01-29: qty 1000

## 2021-01-29 MED ORDER — DEXAMETHASONE SODIUM PHOSPHATE 10 MG/ML IJ SOLN
INTRAMUSCULAR | Status: DC | PRN
  Administered 2021-01-29: 10 mg via INTRAVENOUS

## 2021-01-29 SURGICAL SUPPLY — 61 items
BAG DECANTER FOR FLEXI CONT (MISCELLANEOUS) ×2 IMPLANT
CELLS DAT CNTRL 66122 CELL SVR (MISCELLANEOUS) IMPLANT
COVER PERINEAL POST (MISCELLANEOUS) ×2 IMPLANT
COVER SURGICAL LIGHT HANDLE (MISCELLANEOUS) ×2 IMPLANT
COVER WAND RF STERILE (DRAPES) ×2 IMPLANT
CUP SECTOR GRIPTON 50MM (Cup) ×2 IMPLANT
DERMABOND ADHESIVE PROPEN (GAUZE/BANDAGES/DRESSINGS) ×1
DERMABOND ADVANCED .7 DNX6 (GAUZE/BANDAGES/DRESSINGS) ×1 IMPLANT
DRAPE C-ARM 42X72 X-RAY (DRAPES) ×2 IMPLANT
DRAPE POUCH INSTRU U-SHP 10X18 (DRAPES) ×2 IMPLANT
DRAPE STERI IOBAN 125X83 (DRAPES) ×2 IMPLANT
DRAPE U-SHAPE 47X51 STRL (DRAPES) ×4 IMPLANT
DRSG AQUACEL AG ADV 3.5X10 (GAUZE/BANDAGES/DRESSINGS) ×2 IMPLANT
DURAPREP 26ML APPLICATOR (WOUND CARE) ×4 IMPLANT
ELECT BLADE 4.0 EZ CLEAN MEGAD (MISCELLANEOUS) ×2
ELECT REM PT RETURN 9FT ADLT (ELECTROSURGICAL) ×2
ELECTRODE BLDE 4.0 EZ CLN MEGD (MISCELLANEOUS) ×1 IMPLANT
ELECTRODE REM PT RTRN 9FT ADLT (ELECTROSURGICAL) ×1 IMPLANT
GLOVE ECLIPSE 7.0 STRL STRAW (GLOVE) ×4 IMPLANT
GLOVE SKINSENSE NS SZ7.5 (GLOVE) ×1
GLOVE SKINSENSE STRL SZ7.5 (GLOVE) ×1 IMPLANT
GLOVE SURG SYN 7.5  E (GLOVE) ×4
GLOVE SURG SYN 7.5 E (GLOVE) ×4 IMPLANT
GLOVE SURG UNDER POLY LF SZ7 (GLOVE) ×2 IMPLANT
GOWN STRL REIN XL XLG (GOWN DISPOSABLE) ×2 IMPLANT
GOWN STRL REUS W/ TWL LRG LVL3 (GOWN DISPOSABLE) ×2 IMPLANT
GOWN STRL REUS W/ TWL XL LVL3 (GOWN DISPOSABLE) ×1 IMPLANT
GOWN STRL REUS W/TWL LRG LVL3 (GOWN DISPOSABLE) ×2
GOWN STRL REUS W/TWL XL LVL3 (GOWN DISPOSABLE) ×1
HANDPIECE INTERPULSE COAX TIP (DISPOSABLE) ×1
HEAD FEMORAL 32 CERAMIC (Hips) ×2 IMPLANT
HOOD PEEL AWAY FLYTE STAYCOOL (MISCELLANEOUS) ×4 IMPLANT
IV NS IRRIG 3000ML ARTHROMATIC (IV SOLUTION) ×2 IMPLANT
JET LAVAGE IRRISEPT WOUND (IRRIGATION / IRRIGATOR) ×2
KIT BASIN OR (CUSTOM PROCEDURE TRAY) ×2 IMPLANT
LAVAGE JET IRRISEPT WOUND (IRRIGATION / IRRIGATOR) ×1 IMPLANT
LINER ACET PNNCL PLUS4 NEUTRAL (Hips) ×1 IMPLANT
MARKER SKIN DUAL TIP RULER LAB (MISCELLANEOUS) ×2 IMPLANT
NEEDLE SPNL 18GX3.5 QUINCKE PK (NEEDLE) ×2 IMPLANT
PACK TOTAL JOINT (CUSTOM PROCEDURE TRAY) ×2 IMPLANT
PACK UNIVERSAL I (CUSTOM PROCEDURE TRAY) ×2 IMPLANT
PINNACLE PLUS 4 NEUTRAL (Hips) ×2 IMPLANT
RTRCTR WOUND ALEXIS 18CM MED (MISCELLANEOUS)
SAW OSC TIP CART 19.5X105X1.3 (SAW) ×2 IMPLANT
SET HNDPC FAN SPRY TIP SCT (DISPOSABLE) ×1 IMPLANT
STAPLER VISISTAT 35W (STAPLE) IMPLANT
STEM FEMORAL SZ 5MM STD ACTIS (Stem) ×2 IMPLANT
SUT ETHIBOND 2 V 37 (SUTURE) ×2 IMPLANT
SUT ETHILON 2 0 FS 18 (SUTURE) ×6 IMPLANT
SUT VIC AB 0 CT1 27 (SUTURE) ×2
SUT VIC AB 0 CT1 27XBRD ANBCTR (SUTURE) ×2 IMPLANT
SUT VIC AB 1 CTX 36 (SUTURE) ×1
SUT VIC AB 1 CTX36XBRD ANBCTR (SUTURE) ×1 IMPLANT
SUT VIC AB 2-0 CT1 27 (SUTURE) ×3
SUT VIC AB 2-0 CT1 TAPERPNT 27 (SUTURE) ×3 IMPLANT
SYR 50ML LL SCALE MARK (SYRINGE) ×2 IMPLANT
TOWEL GREEN STERILE (TOWEL DISPOSABLE) ×2 IMPLANT
TRAY CATH 16FR W/PLASTIC CATH (SET/KITS/TRAYS/PACK) IMPLANT
TRAY FOLEY W/BAG SLVR 16FR (SET/KITS/TRAYS/PACK) ×1
TRAY FOLEY W/BAG SLVR 16FR ST (SET/KITS/TRAYS/PACK) ×1 IMPLANT
YANKAUER SUCT BULB TIP NO VENT (SUCTIONS) ×2 IMPLANT

## 2021-01-29 NOTE — Anesthesia Postprocedure Evaluation (Signed)
Anesthesia Post Note  Patient: Jonathan Mcgee  Procedure(s) Performed: RIGHT TOTAL HIP ARTHROPLASTY ANTERIOR APPROACH (Right Hip)     Patient location during evaluation: PACU Anesthesia Type: General Level of consciousness: sedated Pain management: pain level controlled Vital Signs Assessment: post-procedure vital signs reviewed and stable Respiratory status: spontaneous breathing and respiratory function stable Cardiovascular status: stable Postop Assessment: no apparent nausea or vomiting Anesthetic complications: no   No complications documented.  Last Vitals:  Vitals:   01/29/21 1235 01/29/21 1250  BP: 135/60 (!) 126/59  Pulse: 76 62  Resp: 14 15  Temp:  36.6 C  SpO2: 97% 97%    Last Pain:  Vitals:   01/29/21 1250  PainSc: 0-No pain                 Naisha Wisdom DANIEL

## 2021-01-29 NOTE — H&P (Signed)
PREOPERATIVE H&P  Chief Complaint: right hip osteoarthritis  HPI: Jonathan Mcgee is a 70 y.o. male who presents for surgical treatment of right hip osteoarthritis.  He denies any changes in medical history.  Past Medical History:  Diagnosis Date  . Arthritis   . Diabetes mellitus without complication (HCC)   . Hypercholesterolemia   . Hypertension   . Myasthenia gravis (HCC)   . Rheumatoid arthritis [facility])    Past Surgical History:  Procedure Laterality Date  . CATARACT EXTRACTION, BILATERAL  2020  . KNEE SURGERY Right    fractured patella > 20 yrs ago  . PENILE PROSTHESIS IMPLANT  2015   Social History   Socioeconomic History  . Marital status: Single    Spouse name: Not on file  . Number of children: 1  . Years of education: 11  . Highest education level: Not on file  Occupational History    Comment: [employer]  Tobacco Use  . Smoking status: Current Some Day Smoker    Packs/day: 1.00    Types: Cigars  . Smokeless tobacco: Never Used  Vaping Use  . Vaping Use: Never used  Substance and Sexual Activity  . Alcohol use: Not Currently    Comment: occas  . Drug use: No  . Sexual activity: Not on file  Other Topics Concern  . Not on file  Social History Narrative   Drinks 2-3 caffeine drinks a day    Left handed   One story    Social Determinants of Health   Financial Resource Strain: Not on file  Food Insecurity: Not on file  Transportation Needs: Not on file  Physical Activity: Not on file  Stress: Not on file  Social Connections: Not on file   Family History  Problem Relation Age of Onset  . Diabetes Mother   . Heart disease Mother   . Hyperlipidemia Mother   . Heart attack Mother   . Heart murmur Mother   . Hyperlipidemia Father   . Kidney failure Father   . Drug abuse Maternal Grandmother   . Heart disease Maternal Grandmother   . Hypertension Maternal Grandmother   . Hyperlipidemia Maternal Grandmother   . Diabetes Maternal Grandmother     Allergies  Allergen Reactions  . Lisinopril Swelling    Facial swelling   Prior to Admission medications   Medication Sig Start Date End Date Taking? Authorizing Provider  amLODipine (NORVASC) 5 MG tablet TAKE 1 & 1/2 (ONE & ONE-HALF) TABLETS BY MOUTH ONCE DAILY Patient taking differently: Take 7.5 mg by mouth daily. TAKE 1 & 1/2 (ONE & ONE-HALF) TABLETS BY MOUTH ONCE DAILY 01/18/21  Yes Shade Flood, MD  aspirin EC 81 MG tablet Take 1 tablet (81 mg total) by mouth 2 (two) times daily. To be taken after surgery 01/26/21   Cristie Hem, PA-C  atorvastatin (LIPITOR) 20 MG tablet Take 1 tablet (20 mg total) by mouth daily. 08/21/20  Yes Shade Flood, MD  cephALEXin (KEFLEX) 500 MG capsule Take 1 capsule (500 mg total) by mouth 4 (four) times daily. 01/26/21   Cristie Hem, PA-C  docusate sodium (COLACE) 100 MG capsule Take 1 capsule (100 mg total) by mouth daily as needed. 01/26/21 01/26/22  Cristie Hem, PA-C  glipiZIDE (GLUCOTROL) 5 MG tablet Take 0.5 tablets (2.5 mg total) by mouth daily before breakfast. 01/19/20  Yes Shade Flood, MD  hydrochlorothiazide (HYDRODIURIL) 25 MG tablet Take 1 tablet (25 mg total) by mouth daily.  08/21/20  Yes Shade Flood, MD  losartan (COZAAR) 100 MG tablet Take 1 tablet (100 mg total) by mouth daily. 12/25/20  Yes Shade Flood, MD  metFORMIN (GLUCOPHAGE) 1000 MG tablet Take 1 tablet (1,000 mg total) by mouth 2 (two) times daily with a meal. 08/21/20  Yes Shade Flood, MD  methocarbamol (ROBAXIN) 500 MG tablet Take 1 tablet (500 mg total) by mouth 2 (two) times daily as needed. 01/26/21   Cristie Hem, PA-C  ondansetron (ZOFRAN) 4 MG tablet Take 1 tablet (4 mg total) by mouth every 8 (eight) hours as needed for nausea or vomiting. 01/26/21   Cristie Hem, PA-C  oxyCODONE-acetaminophen (PERCOCET) 5-325 MG tablet Take 1-2 tablets by mouth every 6 (six) hours as needed. 01/26/21   Cristie Hem, PA-C  predniSONE (DELTASONE) 10 MG  tablet Take 3 tablets (30 mg total) by mouth daily with breakfast. 12/20/20  Yes Penumalli, Glenford Bayley, MD  pyridostigmine (MESTINON) 60 MG tablet TAKE 1 TABLET BY MOUTH THREE TIMES DAILY Patient taking differently: Take 60 mg by mouth 3 (three) times daily. 09/11/20  Yes Penumalli, Glenford Bayley, MD  furosemide (LASIX) 20 MG tablet Take 1 tablet (20 mg total) by mouth daily. 10/13/20   Shade Flood, MD     Positive ROS: All other systems have been reviewed and were otherwise negative with the exception of those mentioned in the HPI and as above.  Physical Exam: General: Alert, no acute distress Cardiovascular: No pedal edema Respiratory: No cyanosis, no use of accessory musculature GI: abdomen soft Skin: No lesions in the area of chief complaint Neurologic: Sensation intact distally Psychiatric: Patient is competent for consent with normal mood and affect Lymphatic: no lymphedema  MUSCULOSKELETAL: exam stable  Assessment: right hip osteoarthritis  Plan: Plan for Procedure(s): RIGHT TOTAL HIP ARTHROPLASTY ANTERIOR APPROACH  The risks benefits and alternatives were discussed with the patient including but not limited to the risks of nonoperative treatment, versus surgical intervention including infection, bleeding, nerve injury,  blood clots, cardiopulmonary complications, morbidity, mortality, among others, and they were willing to proceed.   Preoperative templating of the joint replacement has been completed, documented, and submitted to the Operating Room personnel in order to optimize intra-operative equipment management.   Glee Arvin, MD 01/29/2021 8:40 AM

## 2021-01-29 NOTE — Discharge Instructions (Signed)

## 2021-01-29 NOTE — Evaluation (Signed)
Physical Therapy Evaluation Patient Details Name: Jonathan Mcgee MRN: [id number] DOB: 10/16/1951 Today's Date: 01/29/2021   History of Present Illness  Pt is a 70 y/o male s/p R THA, direct anterior on 3/7. PMH includes DM, HTN, RA, and myasthenia gravis.  Clinical Impression  Pt admitted secondary to problem above with deficits below. Requiring min to min guard A to stand and transfer to chair this session. Pt currently lives alone, but reports his friend and son will be able to assist. Will continue to follow acutely.     Follow Up Recommendations Follow surgeon's recommendation for DC plan and follow-up therapies    Equipment Recommendations  3in1 (PT)    Recommendations for Other Services       Precautions / Restrictions Precautions Precautions: Fall Restrictions Weight Bearing Restrictions: Yes RLE Weight Bearing: Weight bearing as tolerated      Mobility  Bed Mobility Overal bed mobility: Needs Assistance Bed Mobility: Supine to Sit     Supine to sit: Min assist     General bed mobility comments: Min A for RLE assist and trunk elevation. Increased time required.    Transfers Overall transfer level: Needs assistance Equipment used: Rolling walker (2 wheeled) Transfers: Sit to/from [employer] Sit to Stand: Min assist;From elevated surface Stand pivot transfers: Min guard       General transfer comment: Min A for lift assist and steadying. Cues for hand placement. Min guard for safety to transfer to chair. Cues for sequencing.  Ambulation/Gait                Stairs            Wheelchair Mobility    Modified Rankin (Stroke Patients Only)       Balance Overall balance assessment: Needs assistance Sitting-balance support: No upper extremity supported;Feet supported Sitting balance-Leahy Scale: Fair     Standing balance support: Bilateral upper extremity supported;During functional activity Standing balance-Leahy Scale:  Poor Standing balance comment: Reliant on BUE support                             Pertinent Vitals/Pain Pain Assessment: Faces Faces Pain Scale: Hurts even more Pain Location: R hip Pain Descriptors / Indicators: Grimacing;Guarding Pain Intervention(s): Limited activity within patient's tolerance;Monitored during session;Repositioned    Home Living Family/patient expects to be discharged to:: Private residence Living Arrangements: Alone Available Help at Discharge: Friend(s);Family Type of Home: House Home Access: Level entry     Home Layout: One level Home Equipment: Walker - 2 wheels;Cane - single point      Prior Function Level of Independence: Independent with assistive device(s)         Comments: Was using cane for ambulation.     Hand Dominance        Extremity/Trunk Assessment   Upper Extremity Assessment Upper Extremity Assessment: Overall WFL for tasks assessed    Lower Extremity Assessment Lower Extremity Assessment: RLE deficits/detail RLE Deficits / Details: Deficits consistent with post op pain and weakness. Swelling noted, but pt reports this as baseline.    Cervical / Trunk Assessment Cervical / Trunk Assessment: Normal  Communication   Communication: No difficulties  Cognition Arousal/Alertness: Awake/alert Behavior During Therapy: WFL for tasks assessed/performed Overall Cognitive Status: Within Functional Limits for tasks assessed  General Comments      Exercises     Assessment/Plan    PT Assessment Patient needs continued PT services  PT Problem List Decreased strength;Decreased range of motion;Decreased activity tolerance;Decreased balance;Decreased mobility;Decreased knowledge of use of DME;Decreased knowledge of precautions;Pain       PT Treatment Interventions DME instruction;Gait training;Functional mobility training;Therapeutic exercise;Therapeutic  activities;Balance training;Patient/family education    PT Goals (Current goals can be found in the Care Plan section)  Acute Rehab PT Goals Patient Stated Goal: to go home PT Goal Formulation: With patient Time For Goal Achievement: 02/12/21 Potential to Achieve Goals: Good    Frequency 7X/week   Barriers to discharge        Co-evaluation               AM-PAC PT "6 Clicks" Mobility  Outcome Measure Help needed turning from your back to your side while in a flat bed without using bedrails?: A Little Help needed moving from lying on your back to sitting on the side of a flat bed without using bedrails?: A Little Help needed moving to and from a bed to a chair (including a wheelchair)?: A Little Help needed standing up from a chair using your arms (e.g., wheelchair or bedside chair)?: A Little Help needed to walk in hospital room?: A Little Help needed climbing 3-5 steps with a railing? : A Lot 6 Click Score: 17    End of Session Equipment Utilized During Treatment: Gait belt Activity Tolerance: Patient limited by pain Patient left: in chair;with call bell/phone within reach Nurse Communication: Mobility status PT Visit Diagnosis: Other abnormalities of gait and mobility (R26.89);Pain Pain - Right/Left: Right Pain - part of body: Hip    Time: 2505-3976 PT Time Calculation (min) (ACUTE ONLY): 27 min   Charges:   PT Evaluation $PT Eval Low Complexity: 1 Low PT Treatments $Therapeutic Activity: 8-22 mins        Cindee Salt, DPT  Acute Rehabilitation Services  Pager: [phone] Office: [phone]   Lehman Prom 01/29/2021, 4:49 PM

## 2021-01-29 NOTE — Transfer of Care (Signed)
Immediate Anesthesia Transfer of Care Note  Patient: Quandre Polinski  Procedure(s) Performed: RIGHT TOTAL HIP ARTHROPLASTY ANTERIOR APPROACH (Right Hip)  Patient Location: PACU  Anesthesia Type:General  Level of Consciousness: awake, alert , oriented and patient cooperative  Airway & Oxygen Therapy: Patient Spontanous Breathing  Post-op Assessment: Report given to RN and Post -op Vital signs reviewed and stable  Post vital signs: Reviewed and stable  Last Vitals:  Vitals Value Taken Time  BP 102/59 01/29/21 1203  Temp    Pulse 64 01/29/21 1208  Resp 20 01/29/21 1208  SpO2 98 % 01/29/21 1208  Vitals shown include unvalidated device data.  Last Pain:  Vitals:   01/29/21 0825  PainSc: 0-No pain         Complications: No complications documented.

## 2021-01-29 NOTE — Anesthesia Preprocedure Evaluation (Addendum)
Anesthesia Evaluation  Patient identified by MRN, date of birth, ID band Patient awake    Reviewed: Allergy & Precautions, NPO status , Patient's Chart, lab work & pertinent test results  History of Anesthesia Complications Negative for: history of anesthetic complications  Airway Mallampati: II  TM Distance: >3 FB Neck ROM: Full    Dental no notable dental hx. (+) Dental Advisory Given   Pulmonary Current Smoker,    Pulmonary exam normal        Cardiovascular hypertension, Pt. on medications Normal cardiovascular exam  Narrative  Nuclear stress EF: 59%. The left ventricular ejection fraction is normal  (55-65%).  There was no ST segment deviation noted during stress.  This is a low risk study. There is no evidence of ischemia or previous  infarction.  The study is normal.  IMPRESSIONS    1. Left ventricular ejection fraction, by estimation, is 60 to 65%. The  left ventricle has normal function. The left ventricle has no regional  wall motion abnormalities. There is mild concentric left ventricular  hypertrophy. Left ventricular diastolic  parameters are consistent with Grade I diastolic dysfunction (impaired  relaxation). Elevated left atrial pressure.  2. Right ventricular systolic function is normal. The right ventricular  size is normal. There is normal pulmonary artery systolic pressure. The  estimated right ventricular systolic pressure is 34.4 mmHg.  3. Left atrial size was mildly dilated.  4. The mitral valve is grossly normal. Trivial mitral valve  regurgitation. No evidence of mitral stenosis.  5. The aortic valve is tricuspid. Aortic valve regurgitation is mild. No  aortic stenosis is present.  6. The inferior vena cava is normal in size with greater than 50%  respiratory variability, suggesting right atrial pressure of 3 mmHg.    Neuro/Psych Myasthenia Gravis negative neurological ROS      GI/Hepatic negative GI ROS, Neg liver ROS,   Endo/Other  diabetes  Renal/GU Renal InsufficiencyRenal disease     Musculoskeletal negative musculoskeletal ROS (+)   Abdominal   Peds  Hematology negative hematology ROS (+)   Anesthesia Other Findings   Reproductive/Obstetrics                            Anesthesia Physical Anesthesia Plan  ASA: III  Anesthesia Plan: General   Post-op Pain Management:    Induction: Intravenous  PONV Risk Score and Plan: 2 and Ondansetron and Aprepitant  Airway Management Planned: Oral ETT  Additional Equipment:   Intra-op Plan:   Post-operative Plan: Extubation in OR  Informed Consent: I have reviewed the patients History and Physical, chart, labs and discussed the procedure including the risks, benefits and alternatives for the proposed anesthesia with the patient or authorized representative who has indicated his/her understanding and acceptance.     Dental advisory given  Plan Discussed with: Anesthesiologist and CRNA  Anesthesia Plan Comments:        Anesthesia Quick Evaluation

## 2021-01-29 NOTE — Anesthesia Procedure Notes (Addendum)
Procedure Name: Intubation Date/Time: 01/29/2021 10:00 AM Performed by: Rande Brunt, CRNA Pre-anesthesia Checklist: Patient identified, Emergency Drugs available, Suction available and Patient being monitored Patient Re-evaluated:Patient Re-evaluated prior to induction Oxygen Delivery Method: Circle System Utilized Preoxygenation: Pre-oxygenation with 100% oxygen Induction Type: IV induction Ventilation: Mask ventilation without difficulty Laryngoscope Size: Miller and 2 Grade View: Grade III Tube type: Oral Tube size: 7.5 mm Number of attempts: 2 Airway Equipment and Method: Stylet Placement Confirmation: ETT inserted through vocal cords under direct vision,  positive ETCO2 and breath sounds checked- equal and bilateral Secured at: 22 cm Tube secured with: Tape Dental Injury: Teeth and Oropharynx as per pre-operative assessment  Comments: CRNA attempted with MAC 4 with grade 4 view; patient with limited neck flexion and oral opening. Dr. Tobias Alexander placed ETT with grade 3 view with Sabra Heck 2

## 2021-01-29 NOTE — Care Plan (Signed)
Ortho Bundle Case Management Note  Patient Details  Name: Jonathan Mcgee MRN: [id number] Date of Birth: December 04, 1950  [facility] call to patient to discuss his upcoming Right total hip arthroplasty with Dr. Roda Shutters on 01/29/21. He is an Ortho bundle patient through [facility] and is agreeable to case management. He has a friend and a son that will be assisting after discharge home. He has a FWW, but would need a 3in1/BSC for home use. This will be ordered through [facility]. Referral to [facility] for PT after discharge. Choice provided. Reviewed all post op instructions. Will continue to follow for needs.          DME Arranged:  3-N-1 (Patient reports having a FWW at his home already; needs just 3in1/BSC) DME Agency:  [facility]  HH Arranged:  PT HH Agency:  [facility] (now [facility])  Additional Comments: Please contact me with any questions of if this plan should need to change.  Ralph Dowdy, RN, BSN, [employer]  [phone] 01/29/2021, 3:16 PM

## 2021-01-29 NOTE — Op Note (Signed)
RIGHT TOTAL HIP ARTHROPLASTY ANTERIOR APPROACH  Procedure Note Tevion Laforge   [id number]  Pre-op Diagnosis: right hip osteoarthritis     Post-op Diagnosis: same   Operative Procedures  1. Total hip replacement; Right hip; uncemented [id number]   Surgeon: Gershon Mussel, M.D.  Assist: Oneal Grout, PA-C   Anesthesia: general, zynrelief topical  Prosthesis: Depuy Acetabulum: Pinnacle 50 mm Femur: Actis 5 STD Head: 32 mm size: +1 Liner: +4 Bearing Type: ceramic on poly  Total Hip Arthroplasty (Anterior Approach) Op Note:  After informed consent was obtained and the operative extremity marked in the holding area, the patient was brought back to the operating room and placed supine on the HANA table. Next, the operative extremity was prepped and draped in normal sterile fashion. Surgical timeout occurred verifying patient identification, surgical site, surgical procedure and administration of antibiotics.  A modified anterior Smith-Peterson approach to the hip was performed, using the interval between tensor fascia lata and sartorius.  Dissection was carried bluntly down onto the anterior hip capsule. The lateral femoral circumflex vessels were identified and coagulated. A capsulotomy was performed and the capsular flaps tagged for later repair.  The neck osteotomy was performed. The femoral head was removed, the acetabular rim was cleared of soft tissue and attention was turned to reaming the acetabulum.  Sequential reaming was performed under fluoroscopic guidance. We reamed to a size 49 mm, and then impacted the acetabular shell.  I noticed that this created a minimally displaced posterior acetabular fracture.  The cup was not unstable and the fracture did not displace when stressed.  The liner was then placed after irrigation and attention turned to the femur.  After placing the femoral hook, the leg was taken to externally rotated, extended and adducted position taking care to  perform soft tissue releases to allow for adequate mobilization of the femur. Soft tissue was cleared from the shoulder of the greater trochanter and the hook elevator used to improve exposure of the proximal femur. Sequential broaching performed up to a size 5. Trial neck and head were placed. The leg was brought back up to neutral and the construct reduced.  Antibiotic irrigation was placed in the surgical wound and kept for at least 1 minute.  The position and sizing of components, offset and leg lengths were checked using fluoroscopy. Stability of the construct was checked in extension and external rotation without any subluxation or impingement of prosthesis. We dislocated the prosthesis, dropped the leg back into position, removed trial components, and irrigated copiously. The final stem and head was then placed, the leg brought back up, the system reduced and fluoroscopy used to verify positioning.  We irrigated, obtained hemostasis and closed the capsule using #2 ethibond suture.  One gram of vancomycin powder was placed in the surgical bed. One gram of topical tranexamic acid was injected into the joint.  The fascia was closed with #1 vicryl plus, the deep fat layer was closed with 0 vicryl, the subcutaneous layers closed with 2.0 Vicryl Plus and the skin closed with 2.0 nylon and dermabond. A sterile dressing was applied. The patient was awakened in the operating room and taken to recovery in stable condition.  All sponge, needle, and instrument counts were correct at the end of the case.   Tessa Lerner, my PA, was a medical necessity for opening, closing, limb positioning, retracting, exposing, and overall facilitation and timely completion of the surgery.  Position: supine  Complications: see description of procedure.  Time Out:  performed   Drains/Packing: none  Estimated blood loss: see anesthesia record  Returned to Recovery Room: in good condition.   Antibiotics: yes   Mechanical  VTE (DVT) Prophylaxis: sequential compression devices, TED thigh-high  Chemical VTE (DVT) Prophylaxis: aspirin   Fluid Replacement: see anesthesia record  Specimens Removed: 1 to pathology   Sponge and Instrument Count Correct? yes   PACU: portable radiograph - low AP   Plan/RTC: Return in 2 weeks for staple removal. Weight Bearing/Load Lower Extremity: weight bearing as tolerated Hip precautions: none Suture Removal: 2 weeks   N. Glee Arvin, MD [facility] 11:26 AM   Implant Name Type Inv. Item Serial No. Manufacturer Lot No. LRB No. Used Action  CUP SECTOR GRIPTON - [id number] Cup CUP SECTOR GRIPTON  [employer] [id number] Right 1 Implanted  PINNACLE PLUS 4 NEUTRAL - [id number] Hips PINNACLE PLUS 4 NEUTRAL  [employer]PU[employer] [id number] Right 1 Implanted  HEAD FEMORAL 32 CERAMIC - [id number] Hips HEAD FEMORAL 32 CERAMIC  [employer] [id number] Right 1 Implanted  STEM FEMORAL SZ STD ACTIS - [id number] Stem STEM FEMORAL SZ STD ACTIS  [employer]PU[employer] ORTH[employer] [id number] Right 1 Implanted

## 2021-01-30 ENCOUNTER — Encounter (HOSPITAL_COMMUNITY): Payer: Self-pay | Admitting: Orthopaedic Surgery

## 2021-01-30 DIAGNOSIS — M1611 Unilateral primary osteoarthritis, right hip: Secondary | ICD-10-CM | POA: Diagnosis not present

## 2021-01-30 DIAGNOSIS — F17290 Nicotine dependence, other tobacco product, uncomplicated: Secondary | ICD-10-CM | POA: Diagnosis not present

## 2021-01-30 DIAGNOSIS — Z471 Aftercare following joint replacement surgery: Secondary | ICD-10-CM | POA: Diagnosis not present

## 2021-01-30 DIAGNOSIS — E119 Type 2 diabetes mellitus without complications: Secondary | ICD-10-CM | POA: Diagnosis not present

## 2021-01-30 DIAGNOSIS — Z7984 Long term (current) use of oral hypoglycemic drugs: Secondary | ICD-10-CM | POA: Diagnosis not present

## 2021-01-30 DIAGNOSIS — Z7982 Long term (current) use of aspirin: Secondary | ICD-10-CM | POA: Diagnosis not present

## 2021-01-30 DIAGNOSIS — Z79899 Other long term (current) drug therapy: Secondary | ICD-10-CM | POA: Diagnosis not present

## 2021-01-30 DIAGNOSIS — I10 Essential (primary) hypertension: Secondary | ICD-10-CM | POA: Diagnosis not present

## 2021-01-30 LAB — BASIC METABOLIC PANEL
Anion gap: 11 (ref 5–15)
BUN: 29 mg/dL — ABNORMAL HIGH (ref 8–23)
CO2: 22 mmol/L (ref 22–32)
Calcium: 8.5 mg/dL — ABNORMAL LOW (ref 8.9–10.3)
Chloride: 105 mmol/L (ref 98–111)
Creatinine, Ser: 1.87 mg/dL — ABNORMAL HIGH (ref 0.61–1.24)
GFR, Estimated: 38 mL/min — ABNORMAL LOW (ref >60)
Glucose, Bld: 134 mg/dL — ABNORMAL HIGH (ref 70–99)
Potassium: 4 mmol/L (ref 3.5–5.1)
Sodium: 138 mmol/L (ref 135–145)

## 2021-01-30 LAB — GLUCOSE, CAPILLARY
Glucose-Capillary: 116 mg/dL — ABNORMAL HIGH (ref 70–99)
Glucose-Capillary: 133 mg/dL — ABNORMAL HIGH (ref 70–99)
Glucose-Capillary: 295 mg/dL — ABNORMAL HIGH (ref 70–99)
Glucose-Capillary: 242 mg/dL — ABNORMAL HIGH (ref 70–99)

## 2021-01-30 LAB — CBC
HCT: 22.7 % — ABNORMAL LOW (ref 39.0–52.0)
Hemoglobin: 8 g/dL — ABNORMAL LOW (ref 13.0–17.0)
MCH: 33.2 pg (ref 26.0–34.0)
MCHC: 35.2 g/dL (ref 30.0–36.0)
MCV: 94.2 fL (ref 80.0–100.0)
Platelets: 198 10*3/uL (ref 150–400)
RBC: 2.41 MIL/uL — ABNORMAL LOW (ref 4.22–5.81)
RDW: 13.5 % (ref 11.5–15.5)
WBC: 14.7 10*3/uL — ABNORMAL HIGH (ref 4.0–10.5)
nRBC: 0 % (ref 0.0–0.2)

## 2021-01-30 MED ORDER — SODIUM CHLORIDE 0.9 % IV SOLN: INTRAVENOUS | Status: DC | PRN

## 2021-01-30 NOTE — Progress Notes (Addendum)
Subjective: 1 Day Post-Op Procedure(s) (LRB): RIGHT TOTAL HIP ARTHROPLASTY ANTERIOR APPROACH (Right) Patient reports pain as mild.    Objective: Vital signs in last 24 hours: Temp:  [97.6 F (36.4 C)-99.4 F (37.4 C)] 98.8 F (37.1 C) (03/08 0409) Pulse Rate:  [61-76] 66 (03/08 0409) Resp:  [13-21] 17 (03/08 0409) BP: (102-135)/(54-83) 128/60 (03/08 0409) SpO2:  [95 %-100 %] 98 % (03/08 0409)  Intake/Output from previous day: 03/07 0701 - 03/08 0700 In: 1739.8 [I.V.:1439.8; IV Piggyback:300] Out: 850 [Urine:800; Blood:50] Intake/Output this shift: No intake/output data recorded.  Recent Labs    01/30/21 0447  HGB 8.0*   Recent Labs    01/30/21 0447  WBC 14.7*  RBC 2.41*  HCT 22.7*  PLT 198   Recent Labs    01/30/21 0447  NA 138  K 4.0  CL 105  CO2 22  BUN 29*  CREATININE 1.87*  GLUCOSE 134*  CALCIUM 8.5*   No results for input(s): LABPT, INR in the last 72 hours.  Neurologically intact Neurovascular intact Sensation intact distally Intact pulses distally Dorsiflexion/Plantar flexion intact Incision: dressing C/D/I No cellulitis present Compartment soft   Assessment/Plan: 1 Day Post-Op Procedure(s) (LRB): RIGHT TOTAL HIP ARTHROPLASTY ANTERIOR APPROACH (Right) Advance diet Up with therapy Plan for discharge tomorrow  WBAT RLE- let pain be guide ABLA- mild and stable.  Currently asymptomatic.  Will reorder cbc for tomorrow am Post-op rx sent in last week AKI- continue fluids 75/hr.  D/c toradol.  Encourage po intake       Cristie Hem 01/30/2021, 8:14 AM

## 2021-01-30 NOTE — Progress Notes (Signed)
Physical Therapy Treatment Patient Details Name: Jonathan Mcgee MRN: [id number] DOB: 1951-05-08 Today's Date: 01/30/2021    History of Present Illness Pt is a 70 y/o male s/p R THA, direct anterior on 3/7. PMH includes DM, HTN, RA, and myasthenia gravis.    PT Comments    Pt received in bed, willing to participate with PT. Reviewed WBAT precaution. Needed assistance for R LE management during bed mobiltiy and transfers due to pain, but steady in standing with RW. Decreased weight shift to R during standing marches and ambulation. Reliant on B UE support in standing. Cueing needed for hand placement and safe navigation of RW. Pt left in chair with all needs met, call bell within reach, chair alarm active, and RN aware of status. Will continue to follow acutely. Plan to attempt to progress gait distance.   Follow Up Recommendations  Follow surgeon's recommendation for DC plan and follow-up therapies     Equipment Recommendations  3in1 (PT)    Recommendations for Other Services       Precautions / Restrictions Precautions Precautions: Fall Restrictions Weight Bearing Restrictions: Yes RLE Weight Bearing: Weight bearing as tolerated    Mobility  Bed Mobility Overal bed mobility: Needs Assistance Bed Mobility: Supine to Sit     Supine to sit: Min assist     General bed mobility comments: Min A for RLE assist and trunk elevation. Increased time required.    Transfers Overall transfer level: Needs assistance Equipment used: Rolling walker (2 wheeled) Transfers: Sit to/from Stand Sit to Stand: Min assist;From elevated surface         General transfer comment: Min A for power up into standing and steadying upon standing. Cueing for hand placement  Ambulation/Gait Ambulation/Gait assistance: Min guard Gait Distance (Feet): 12 Feet Assistive device: Rolling walker (2 wheeled) Gait Pattern/deviations: Step-to pattern;Decreased weight shift to right;Trunk flexed Gait  velocity: Decreased   General Gait Details: Min guard for safety, Cueing to maintain RW at appropriate distance   Stairs             Wheelchair Mobility    Modified Rankin (Stroke Patients Only)       Balance Overall balance assessment: Needs assistance Sitting-balance support: No upper extremity supported;Feet supported Sitting balance-Leahy Scale: Fair     Standing balance support: Bilateral upper extremity supported;During functional activity Standing balance-Leahy Scale: Poor Standing balance comment: Reliant on BUE support                            Cognition Arousal/Alertness: Awake/alert Behavior During Therapy: WFL for tasks assessed/performed Overall Cognitive Status: Within Functional Limits for tasks assessed                                        Exercises      General Comments General comments (skin integrity, edema, etc.): VSS on RA.      Pertinent Vitals/Pain Pain Assessment: 0-10 Pain Score: 5  Pain Location: R hip Pain Descriptors / Indicators: Grimacing;Guarding Pain Intervention(s): Monitored during session;Repositioned    Home Living                      Prior Function            PT Goals (current goals can now be found in the care plan section) Acute Rehab PT Goals Patient Stated  Goal: to go home PT Goal Formulation: With patient Time For Goal Achievement: 02/12/21 Potential to Achieve Goals: Good    Frequency    7X/week      PT Plan      Co-evaluation              AM-PAC PT "6 Clicks" Mobility   Outcome Measure  Help needed turning from your back to your side while in a flat bed without using bedrails?: A Little Help needed moving from lying on your back to sitting on the side of a flat bed without using bedrails?: A Little Help needed moving to and from a bed to a chair (including a wheelchair)?: A Little Help needed standing up from a chair using your arms (e.g.,  wheelchair or bedside chair)?: A Little Help needed to walk in hospital room?: A Little Help needed climbing 3-5 steps with a railing? : A Lot 6 Click Score: 17    End of Session Equipment Utilized During Treatment: Gait belt Activity Tolerance: Patient tolerated treatment well Patient left: in chair;with call bell/phone within reach;with chair alarm set Nurse Communication: Mobility status PT Visit Diagnosis: Other abnormalities of gait and mobility (R26.89);Pain Pain - Right/Left: Right Pain - part of body: Hip     Time:  -     Charges:                        Rosita Kea, SPT

## 2021-01-30 NOTE — TOC Initial Note (Addendum)
Transition of Care [facility]) - Initial/Assessment Note    Patient Details  Name: Jonathan Mcgee MRN: [id number] Date of Birth: [birth year]  Transition of Care [facility]) CM/SW Contact:    Kingsley Plan, RN Phone Number: 01/30/2021, 11:59 AM  Clinical Narrative:                 Patient from home, states he has help. Patient has walker at home. Dr Roda Shutters office has arranged home health PT through [facility], [place] confirmed with [place] with [facility].   Per note Sheri at Dr Roda Shutters office ordered 3 in1. NCM called Sheri to confirm left message.   Patient was aware about [facility] but not 3 in 1 .   Called Velna Hatchet with [facility], they did receive an order for a 3in 1 they will bring to hospital room today. Patient aware.  Expected Discharge Plan: Home w Home Health Services Barriers to Discharge: Continued Medical Work up   Patient Goals and CMS Choice Patient states their goals for this hospitalization and ongoing recovery are:: to go home CMS Medicare.gov Compare Post Acute Care list provided to:: Patient Choice offered to / list presented to : Patient  Expected Discharge Plan and Services Expected Discharge Plan: Home w Home Health Services     Post Acute Care Choice: Home Health,Durable Medical Equipment Living arrangements for the past 2 months: Single Family Home                 DME Arranged: 3-N-1 (Patient reports having a FWW at his home already; needs just 3in1/BSC) DME Agency: [facility]       HH Arranged: PT HH Agency: [facility]) Date HH Agency Contacted: 01/30/21 Time HH Agency Contacted: 1158 Representative spoke with at [facility] Agency: [place]  Prior Living Arrangements/Services Living arrangements for the past 2 months: Single Family Home Lives with:: Other (Comment) (patient stated he has help at home, would not say who) Patient language and need for interpreter reviewed:: Yes        Need for Family Participation in  Patient Care: Yes (Comment) Care giver support system in place?: Yes (comment) Current home services: DME Criminal Activity/Legal Involvement Pertinent to Current Situation/Hospitalization: No - Comment as needed  Activities of Daily Living Home Assistive Devices/Equipment: Cane (specify quad or straight) ADL Screening (condition at time of admission) Patient's cognitive ability adequate to safely complete daily activities?: Yes Is the patient deaf or have difficulty hearing?: No Does the patient have difficulty seeing, even when wearing glasses/contacts?: No Does the patient have difficulty concentrating, remembering, or making decisions?: No Patient able to express need for assistance with ADLs?: Yes Does the patient have difficulty dressing or bathing?: No Independently performs ADLs?: Yes (appropriate for developmental age) Does the patient have difficulty walking or climbing stairs?: Yes Weakness of Legs: None Weakness of Arms/Hands: None  Permission Sought/Granted   Permission granted to share information with : No              Emotional Assessment Appearance:: Appears stated age Attitude/Demeanor/Rapport: Engaged Affect (typically observed): Accepting Orientation: : Oriented to Self,Oriented to Place,Oriented to  Time,Oriented to Situation Alcohol / Substance Use: Not Applicable Psych Involvement: No (comment)  Admission diagnosis:  Status post total replacement of right hip [Z96.641] Patient Active Problem List   Diagnosis Date Noted  . 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 DISH (diffuse idiopathic skeletal hyperostosis) 11/29/2020  . Primary osteoarthritis of  right hip 11/14/2020  . Rheumatoid arthritis (HCC) 09/23/2019  . Morbid obesity (HCC) 07/19/2019  . UTI (urinary tract infection) 02/20/2019  . Suspected COVID-19 virus infection 02/20/2019  . Type 2 diabetes mellitus (HCC) 02/20/2019  . Sepsis (HCC) 02/19/2019  . CKD (chronic kidney  disease) 04/12/2017  . Essential hypertension 01/13/2017  . Hyperlipidemia 01/13/2017  . Myasthenia gravis (HCC) 08/28/2016   PCP:  Shade Flood, MD Pharmacy:   [facility] [address], [place] - [address] [place] [address] Phone: [phone] Fax: [phone]     Social Determinants of Health (SDOH) Interventions    Readmission Risk Interventions No flowsheet data found.

## 2021-01-30 NOTE — Progress Notes (Signed)
Physical Therapy Treatment Patient Details Name: Holger Sokolowski MRN: [id number] DOB: [birth year] Today's Date: 01/30/2021    History of Present Illness Pt is a 70 y/o male s/p R THA, direct anterior on 3/7. PMH includes DM, HTN, RA, and myasthenia gravis.    PT Comments    Pt received in chair. Good carryover regarding hand placement and navigation of RW. Able to progress gait distance to ~41ft with RW, which pt reports is close to his baseline. Reviewed all exercises in hip protocol. Needed increased tactile and verbal cueing for R heels slides, R hip abd/adduction, and R hamstring curl in standing due to pain. Better weight shift onto R LE during standing marches. Cueing in standing to maintain upright posture. Education provided on car transfers. Pt demonstrated understanding. Left in chair with all needs met, call bell within reach, and chair alarm active.    Follow Up Recommendations  Follow surgeon's recommendation for DC plan and follow-up therapies     Equipment Recommendations  3in1 (PT)    Recommendations for Other Services       Precautions / Restrictions Precautions Precautions: Fall Restrictions Weight Bearing Restrictions: Yes RLE Weight Bearing: Weight bearing as tolerated    Mobility  Bed Mobility Overal bed mobility: Needs Assistance Bed Mobility: Supine to Sit     Supine to sit: Min assist     General bed mobility comments: Pt received in chair    Transfers Overall transfer level: Needs assistance Equipment used: Rolling walker (2 wheeled) Transfers: Sit to/from Stand Sit to Stand: Min assist;From elevated surface         General transfer comment: Min A for steadying once in stand. No assist given to get into standing. Good carryover for correct handplacement  Ambulation/Gait Ambulation/Gait assistance: Min guard Gait Distance (Feet): 50 Feet Assistive device: Rolling walker (2 wheeled) Gait Pattern/deviations: Step-to pattern;Decreased weight  shift to right;Trunk flexed Gait velocity: Decreased   General Gait Details: Min guard for safety, cueing to maintain upright posture   Stairs             Wheelchair Mobility    Modified Rankin (Stroke Patients Only)       Balance Overall balance assessment: Needs assistance Sitting-balance support: No upper extremity supported;Feet supported Sitting balance-Leahy Scale: Fair     Standing balance support: Bilateral upper extremity supported;During functional activity Standing balance-Leahy Scale: Poor Standing balance comment: Reliant on BUE support                            Cognition Arousal/Alertness: Awake/alert Behavior During Therapy: WFL for tasks assessed/performed Overall Cognitive Status: Within Functional Limits for tasks assessed                                        Exercises General Exercises - Lower Extremity Ankle Circles/Pumps: Both;10 reps Quad Sets: Both;10 reps Long Arc Quad: Both;10 reps Heel Slides: Both;5 reps Hip ABduction/ADduction: Both;10 reps Hip Flexion/Marching: Both;10 reps;Standing    General Comments General comments (skin integrity, edema, etc.): VSS on RA.      Pertinent Vitals/Pain Pain Assessment: 0-10 Pain Score: 5  Pain Location: R hip Pain Descriptors / Indicators: Grimacing;Guarding Pain Intervention(s): Monitored during session    Home Living                      Prior Function  PT Goals (current goals can now be found in the care plan section) Acute Rehab PT Goals Patient Stated Goal: to go home PT Goal Formulation: With patient Time For Goal Achievement: 02/12/21 Potential to Achieve Goals: Good    Frequency    7X/week      PT Plan      Co-evaluation              AM-PAC PT "6 Clicks" Mobility   Outcome Measure  Help needed turning from your back to your side while in a flat bed without using bedrails?: A Little Help needed moving from  lying on your back to sitting on the side of a flat bed without using bedrails?: A Little Help needed moving to and from a bed to a chair (including a wheelchair)?: A Little Help needed standing up from a chair using your arms (e.g., wheelchair or bedside chair)?: A Little Help needed to walk in hospital room?: A Little Help needed climbing 3-5 steps with a railing? : A Lot 6 Click Score: 17    End of Session Equipment Utilized During Treatment: Gait belt Activity Tolerance: Patient tolerated treatment well Patient left: in chair;with call bell/phone within reach;with chair alarm set Nurse Communication: Mobility status PT Visit Diagnosis: Other abnormalities of gait and mobility (R26.89);Pain Pain - Right/Left: Right Pain - part of body: Hip        Rosita Kea, SPT

## 2021-01-31 ENCOUNTER — Other Ambulatory Visit: Payer: Self-pay | Admitting: Family Medicine

## 2021-01-31 DIAGNOSIS — R609 Edema, unspecified: Secondary | ICD-10-CM

## 2021-01-31 DIAGNOSIS — E119 Type 2 diabetes mellitus without complications: Secondary | ICD-10-CM | POA: Diagnosis not present

## 2021-01-31 DIAGNOSIS — Z7984 Long term (current) use of oral hypoglycemic drugs: Secondary | ICD-10-CM | POA: Diagnosis not present

## 2021-01-31 DIAGNOSIS — I10 Essential (primary) hypertension: Secondary | ICD-10-CM | POA: Diagnosis not present

## 2021-01-31 DIAGNOSIS — Z79899 Other long term (current) drug therapy: Secondary | ICD-10-CM | POA: Diagnosis not present

## 2021-01-31 DIAGNOSIS — M1611 Unilateral primary osteoarthritis, right hip: Secondary | ICD-10-CM | POA: Diagnosis not present

## 2021-01-31 DIAGNOSIS — Z7982 Long term (current) use of aspirin: Secondary | ICD-10-CM | POA: Diagnosis not present

## 2021-01-31 DIAGNOSIS — F17290 Nicotine dependence, other tobacco product, uncomplicated: Secondary | ICD-10-CM | POA: Diagnosis not present

## 2021-01-31 LAB — CBC
HCT: 23.1 % — ABNORMAL LOW (ref 39.0–52.0)
Hemoglobin: 7.9 g/dL — ABNORMAL LOW (ref 13.0–17.0)
MCH: 32.4 pg (ref 26.0–34.0)
MCHC: 34.2 g/dL (ref 30.0–36.0)
MCV: 94.7 fL (ref 80.0–100.0)
Platelets: 208 10*3/uL (ref 150–400)
RBC: 2.44 MIL/uL — ABNORMAL LOW (ref 4.22–5.81)
RDW: 13.7 % (ref 11.5–15.5)
WBC: 15 10*3/uL — ABNORMAL HIGH (ref 4.0–10.5)
nRBC: 0 % (ref 0.0–0.2)

## 2021-01-31 LAB — GLUCOSE, CAPILLARY
Glucose-Capillary: 112 mg/dL — ABNORMAL HIGH (ref 70–99)
Glucose-Capillary: 122 mg/dL — ABNORMAL HIGH (ref 70–99)

## 2021-01-31 NOTE — Progress Notes (Signed)
Subjective: 2 Days Post-Op Procedure(s) (LRB): RIGHT TOTAL HIP ARTHROPLASTY ANTERIOR APPROACH (Right) Patient reports pain as mild.  No lightheadedness/dizziness, chest pain/pressure/palpitations, no sob.   Objective: Vital signs in last 24 hours: Temp:  [97.8 F (36.6 C)-98.7 F (37.1 C)] 98.4 F (36.9 C) (03/09 0524) Pulse Rate:  [63-66] 63 (03/09 0524) Resp:  [17-18] 18 (03/09 0524) BP: (129-139)/(55-86) 132/65 (03/09 0524) SpO2:  [98 %-100 %] 98 % (03/09 0524)  Intake/Output from previous day: 03/08 0701 - 03/09 0700 In: 480 [P.O.:480] Out: -  Intake/Output this shift: No intake/output data recorded.  Recent Labs    01/30/21 0447 01/31/21 0543  HGB 8.0* 7.9*   Recent Labs    01/30/21 0447 01/31/21 0543  WBC 14.7* 15.0*  RBC 2.41* 2.44*  HCT 22.7* 23.1*  PLT 198 208   Recent Labs    01/30/21 0447  NA 138  K 4.0  CL 105  CO2 22  BUN 29*  CREATININE 1.87*  GLUCOSE 134*  CALCIUM 8.5*   No results for input(s): LABPT, INR in the last 72 hours.  Neurologically intact Neurovascular intact Sensation intact distally Intact pulses distally Dorsiflexion/Plantar flexion intact Incision: dressing C/D/I No cellulitis present Compartment soft   Assessment/Plan: 2 Days Post-Op Procedure(s) (LRB): RIGHT TOTAL HIP ARTHROPLASTY ANTERIOR APPROACH (Right) Advance diet Up with therapy Discharge home with home health after first PT session WBAT RLE ABLA- mild and stable       Cristie Hem 01/31/2021, 8:30 AM

## 2021-01-31 NOTE — Progress Notes (Signed)
Physical Therapy Treatment Patient Details Name: Jonathan Mcgee MRN: [id number] DOB: Mar 24, 1951 Today's Date: 01/31/2021    History of Present Illness Pt is a 70 y/o male s/p R THA, direct anterior on 3/7. PMH includes DM, HTN, RA, and myasthenia gravis.    PT Comments    Pt making excellent progress.  He is very motivated and has good pain control for POD #2.  Pt was educated on HEP with AAROM techniques and correct form to assist with pain control.  Also, encouraged focus on short bouts of ambulation and could decrease hip flexion exercises as needed for pain control in first week. Pt demonstrates mobility necessary to return home with family from PT perspective. But Will continue to benefit from PT while hospitalized to progress independence.     Follow Up Recommendations  Follow surgeon's recommendation for DC plan and follow-up therapies;Supervision for mobility/OOB     Equipment Recommendations  3in1 (PT)    Recommendations for Other Services       Precautions / Restrictions Precautions Precautions: Fall Restrictions Weight Bearing Restrictions: Yes RLE Weight Bearing: Weight bearing as tolerated    Mobility  Bed Mobility               General bed mobility comments: Pt received in chair - discussed sit/supine with assist for R LE or use of gait belt for R LE    Transfers Overall transfer level: Needs assistance Equipment used: Rolling walker (2 wheeled) Transfers: Sit to/from Stand Sit to Stand: Supervision         General transfer comment: Performed safely with supervision; has been performing mod I in room  Ambulation/Gait Ambulation/Gait assistance: Supervision Gait Distance (Feet): 100 Feet Assistive device: Rolling walker (2 wheeled) Gait Pattern/deviations: Step-to pattern;Decreased stride length;Decreased weight shift to right Gait velocity: decreased   General Gait Details: Mild antalgic pattern; cued for posture and educated on setting RW  height   Stairs Stairs:  (no steps at his home)           Wheelchair Mobility    Modified Rankin (Stroke Patients Only)       Balance Overall balance assessment: Needs assistance Sitting-balance support: No upper extremity supported;Feet supported Sitting balance-Leahy Scale: Good     Standing balance support: Bilateral upper extremity supported;No upper extremity supported Standing balance-Leahy Scale: Fair Standing balance comment: RW for ambulation; no AD static                            Cognition Arousal/Alertness: Awake/alert Behavior During Therapy: WFL for tasks assessed/performed Overall Cognitive Status: Within Functional Limits for tasks assessed                                 General Comments: Motivated and pleasant      Exercises Total Joint Exercises Ankle Circles/Pumps: AROM;Both;10 reps;Seated Quad Sets: AROM;Both;10 reps;Seated Heel Slides: AAROM;Right;Supine;5 reps (provided assistance and demonstrated gait belt for AAROM; limited reps for pain control) Hip ABduction/ADduction: AAROM;Supine;Right;5 reps (provided assistance and demonstrated gait belt for AAROM; limited reps for pain control) Long Arc Quad: AROM;Both;10 reps;Seated (cues to keep thigh flat on chair)    General Comments General comments (skin integrity, edema, etc.): VSS on RA.  Educated on safe ice use and frequent short bouts of ambulation.  Also, discussed total hip HEP and educated that he may need to decrease exercises that strain R hip flexors (  marching, heel slides) the first week in order to control pain - focus on walking. Also, advised not beginning standing exercises until week 2 and on R LE only.      Pertinent Vitals/Pain Pain Assessment: 0-10 Pain Score: 4  Pain Location: R hip Pain Descriptors / Indicators: Grimacing;Guarding Pain Intervention(s): Limited activity within patient's tolerance;Monitored during session;Premedicated before  session;Ice applied    Home Living                      Prior Function            PT Goals (current goals can now be found in the care plan section) Acute Rehab PT Goals Patient Stated Goal: to go home PT Goal Formulation: With patient Time For Goal Achievement: 02/12/21 Potential to Achieve Goals: Good Progress towards PT goals: Progressing toward goals    Frequency    7X/week      PT Plan Current plan remains appropriate    Co-evaluation              AM-PAC PT "6 Clicks" Mobility   Outcome Measure  Help needed turning from your back to your side while in a flat bed without using bedrails?: A Little Help needed moving from lying on your back to sitting on the side of a flat bed without using bedrails?: A Little Help needed moving to and from a bed to a chair (including a wheelchair)?: A Little Help needed standing up from a chair using your arms (e.g., wheelchair or bedside chair)?: A Little Help needed to walk in hospital room?: A Little Help needed climbing 3-5 steps with a railing? : A Little 6 Click Score: 18    End of Session Equipment Utilized During Treatment: Gait belt Activity Tolerance: Patient tolerated treatment well Patient left: in chair;with call Mcgee/phone within reach (pt has been ambulating to restroom independently this morning - left alarm off) Nurse Communication: Mobility status PT Visit Diagnosis: Other abnormalities of gait and mobility (R26.89);Pain Pain - Right/Left: Right Pain - part of body: Hip     Time: 2694-8546 PT Time Calculation (min) (ACUTE ONLY): 24 min  Charges:  $Gait Training: 8-22 mins $Therapeutic Exercise: 8-22 mins                     Anise Salvo, PT Acute Rehab Services Pager [phone] [facility] [phone]     Rayetta Humphrey 01/31/2021, 10:52 AM

## 2021-01-31 NOTE — Telephone Encounter (Signed)
Medication Refill - Medication:   furosemide (LASIX) 20 MG tablet    Has the patient contacted their pharmacy? Yes, contact pcp.  Preferred Pharmacy (with phone number or street name):   [facility] [address] [place] - [address]  [address] [place] [address]  Phone:  [phone] Fax:  [phone]   Agent: Please be advised that RX refills may take up to 3 business days. We ask that you follow-up with your pharmacy.

## 2021-01-31 NOTE — Telephone Encounter (Signed)
Requested medication (s) are due for refill today: yes  Requested medication (s) are on the active medication list: yes  Last refill: ?  Future visit scheduled: no  Notes to clinic:  original script per Dr Birdie Sons during hospital admission   Requested Prescriptions  Pending Prescriptions Disp Refills   furosemide (LASIX) 20 MG tablet 30 tablet 1    Sig: Take 1 tablet (20 mg total) by mouth daily.      Cardiovascular:  Diuretics - Loop Failed - 01/31/2021 12:34 PM      Failed - Ca in normal range and within 360 days    Calcium  Date Value Ref Range Status  01/30/2021 8.5 (L) 8.9 - 10.3 mg/dL Final          Failed - Cr in normal range and within 360 days    Creat  Date Value Ref Range Status  06/29/2016 1.39 (H) 0.70 - 1.25 mg/dL Final    Comment:      For patients > or = 71 years of age: The upper reference limit for Creatinine is approximately 13% higher for people identified as African-American.      Creatinine, Ser  Date Value Ref Range Status  01/30/2021 1.87 (H) 0.61 - 1.24 mg/dL Final          Passed - K in normal range and within 360 days    Potassium  Date Value Ref Range Status  01/30/2021 4.0 3.5 - 5.1 mmol/L Final          Passed - Na in normal range and within 360 days    Sodium  Date Value Ref Range Status  01/30/2021 138 135 - 145 mmol/L Final  01/15/2021 146 (H) 134 - 144 mmol/L Final          Passed - Last BP in normal range    BP Readings from Last 1 Encounters:  01/31/21 132/65          Passed - Valid encounter within last 6 months    Recent Outpatient Visits           1 month ago Right hip pain   [facility], Asencion Partridge, MD   2 months ago Peripheral edema   [facility], Asencion Partridge, MD   3 months ago Peripheral edema   [facility], Asencion Partridge, MD   5 months ago Type 2 diabetes mellitus with chronic kidney disease, without long-term current use of insulin, unspecified CKD stage [facility])    [facility], Asencion Partridge, MD   6 months ago Type 2 diabetes mellitus with chronic kidney disease, without long-term current use of insulin, unspecified CKD stage [facility])   [facility], Asencion Partridge, MD       Future Appointments             In 1 week Tarry Kos, MD [facility]   In 4 weeks Rice, Jamesetta Orleans, MD [facility]

## 2021-01-31 NOTE — Discharge Summary (Signed)
Patient ID: Jonathan Mcgee MRN: [id number] DOB/AGE: January 06, 1951 70 y.o.  Admit date: 01/29/2021 Discharge date: 01/31/2021  Admission Diagnoses:  Principal Problem:   Primary osteoarthritis of right hip Active Problems:   Status post total replacement of right hip   Discharge Diagnoses:  Same  Past Medical History:  Diagnosis Date   Arthritis    Diabetes mellitus without complication (HCC)    Hypercholesterolemia    Hypertension    Myasthenia gravis (HCC)    Rheumatoid arthritis (HCC)     Surgeries: Procedure(s): RIGHT TOTAL HIP ARTHROPLASTY ANTERIOR APPROACH on 01/29/2021   Consultants:   Discharged Condition: Improved  Hospital Course: Jonathan Mcgee is an 70 y.o. male who was admitted 01/29/2021 for operative treatment ofPrimary osteoarthritis of right hip. Patient has severe unremitting pain that affects sleep, daily activities, and work/hobbies. After pre-op clearance the patient was taken to the operating room on 01/29/2021 and underwent  Procedure(s): RIGHT TOTAL HIP ARTHROPLASTY ANTERIOR APPROACH.    Patient was given perioperative antibiotics:  Anti-infectives (From admission, onward)   Start     Dose/Rate Route Frequency Ordered Stop   01/29/21 1800  ceFAZolin (ANCEF) IVPB 2g/100 mL premix        2 g 200 mL/hr over 30 Minutes Intravenous Every 6 hours 01/29/21 1524 01/29/21 2359   01/29/21 1039  vancomycin (VANCOCIN) powder  Status:  Discontinued          As needed 01/29/21 1039 01/29/21 1204   01/29/21 0800  ceFAZolin (ANCEF) IVPB 2g/100 mL premix        2 g 200 mL/hr over 30 Minutes Intravenous On call to O.R. 01/29/21 0748 01/29/21 1010   01/29/21 0750  vancomycin (VANCOCIN) IVPB 1000 mg/200 mL premix        1,000 mg 200 mL/hr over 60 Minutes Intravenous 60 min pre-op 01/29/21 0750 01/29/21 0943   01/29/21 0600  vancomycin (VANCOREADY) IVPB 1000 mg/200 mL  Status:  Discontinued        1,000 mg 200 mL/hr over 60 Minutes Intravenous 60 min pre-op 01/26/21 1053  01/26/21 1056       Patient was given sequential compression devices, early ambulation, and chemoprophylaxis to prevent DVT.  Patient benefited maximally from hospital stay and there were no complications.    Recent vital signs:  Patient Vitals for the past 24 hrs:  BP Temp Temp src Pulse Resp SpO2  01/31/21 0524 132/65 98.4 F (36.9 C) Oral 63 18 98 %  01/31/21 0050 129/66 97.8 F (36.6 C) Oral 63 18 98 %  01/30/21 1700 139/86 98.6 F (37 C) Oral 66 17 100 %  01/30/21 1144 (!) 133/55 98.7 F (37.1 C) Oral 64 17 98 %     Recent laboratory studies:  Recent Labs    01/30/21 0447 01/31/21 0543  WBC 14.7* 15.0*  HGB 8.0* 7.9*  HCT 22.7* 23.1*  PLT 198 208  NA 138  --   K 4.0  --   CL 105  --   CO2 22  --   BUN 29*  --   CREATININE 1.87*  --   GLUCOSE 134*  --   CALCIUM 8.5*  --      Discharge Medications:   Allergies as of 01/31/2021      Reactions   Lisinopril Swelling   Facial swelling      Medication List    TAKE these medications   amLODipine 5 MG tablet Commonly known as: NORVASC TAKE 1 & 1/2 (ONE & ONE-HALF) TABLETS BY  MOUTH ONCE DAILY What changed: See the new instructions.   aspirin EC 81 MG tablet Take 1 tablet (81 mg total) by mouth 2 (two) times daily. To be taken after surgery   atorvastatin 20 MG tablet Commonly known as: LIPITOR Take 1 tablet (20 mg total) by mouth daily.   cephALEXin 500 MG capsule Commonly known as: Keflex Take 1 capsule (500 mg total) by mouth 4 (four) times daily.   docusate sodium 100 MG capsule Commonly known as: Colace Take 1 capsule (100 mg total) by mouth daily as needed.   furosemide 20 MG tablet Commonly known as: LASIX Take 1 tablet (20 mg total) by mouth daily.   glipiZIDE 5 MG tablet Commonly known as: GLUCOTROL Take 0.5 tablets (2.5 mg total) by mouth daily before breakfast.   hydrochlorothiazide 25 MG tablet Commonly known as: HYDRODIURIL Take 1 tablet (25 mg total) by mouth daily.   losartan  100 MG tablet Commonly known as: COZAAR Take 1 tablet (100 mg total) by mouth daily.   metFORMIN 1000 MG tablet Commonly known as: GLUCOPHAGE Take 1 tablet (1,000 mg total) by mouth 2 (two) times daily with a meal.   methocarbamol 500 MG tablet Commonly known as: Robaxin Take 1 tablet (500 mg total) by mouth 2 (two) times daily as needed.   ondansetron 4 MG tablet Commonly known as: Zofran Take 1 tablet (4 mg total) by mouth every 8 (eight) hours as needed for nausea or vomiting.   oxyCODONE-acetaminophen 5-325 MG tablet Commonly known as: Percocet Take 1-2 tablets by mouth every 6 (six) hours as needed.   predniSONE 10 MG tablet Commonly known as: DELTASONE Take 3 tablets (30 mg total) by mouth daily with breakfast.   pyridostigmine 60 MG tablet Commonly known as: MESTINON TAKE 1 TABLET BY MOUTH THREE TIMES DAILY            Durable Medical Equipment  (From admission, onward)         Start     Ordered   01/29/21 1525  DME Walker rolling  Once       Question:  Patient needs a walker to treat with the following condition  Answer:  History of hip replacement   01/29/21 1524   01/29/21 1525  DME 3 n 1  Once        01/29/21 1524   01/29/21 1525  DME Bedside commode  Once       Question:  Patient needs a bedside commode to treat with the following condition  Answer:  History of hip replacement   01/29/21 1524          Diagnostic Studies: DG Pelvis Portable  Addendum Date: 01/29/2021   ADDENDUM REPORT: 01/29/2021 13:00 ADDENDUM: These results were called by telephone at the time of interpretation on 01/29/2021 at 1:00 pm to provider [facility] , who verbally acknowledged these results. Electronically Signed   By: Donzetta Kohut M.D.   On: 01/29/2021 13:00   Result Date: 01/29/2021 CLINICAL DATA:  Postop RIGHT hip replacement EXAM: PORTABLE PELVIS 1-2 VIEWS COMPARISON:  Intraoperative evaluation of the hip from January 29, 2021 and exam from November 14, 2020 FINDINGS: Since  the exam of December there is been interval RIGHT hip replacement, total hip arthroplasty with signs of RIGHT acetabular fracture. Cortical irregularity seen along the iliopectineal line on the RIGHT with mild displacement. Femoral component appears well position on frontal view. Postoperative changes in the soft tissues. IMPRESSION: 1. Mildly displaced RIGHT acetabular fracture. 2.  Post RIGHT hip arthroplasty with otherwise uncomplicated appearance. Call is out to the referring provider to further discuss findings in the above case. Electronically Signed: By: Donzetta Kohut M.D. On: 01/29/2021 12:44   DG C-Arm 1-60 Min  Result Date: 01/29/2021 CLINICAL DATA:  Right hip replacement EXAM: C-arm images right hip COMPARISON:  11/14/2020 FINDINGS: Two C-arm images show total hip replacement on the right. Components appear well positioned. No radiographically detectable complication. IMPRESSION: Good appearance following right hip replacement. Electronically Signed   By: Paulina Fusi M.D.   On: 01/29/2021 11:44   DG HIP OPERATIVE UNILAT WITH PELVIS RIGHT  Result Date: 01/29/2021 CLINICAL DATA:  Right hip replacement EXAM: C-arm images right hip COMPARISON:  11/14/2020 FINDINGS: Two C-arm images show total hip replacement on the right. Components appear well positioned. No radiographically detectable complication. IMPRESSION: Good appearance following right hip replacement. Electronically Signed   By: Paulina Fusi M.D.   On: 01/29/2021 11:44    Disposition: Discharge disposition: 01-Home or Self Care          Follow-up Information    Tarry Kos, MD. Go on 02/13/2021.   Specialty: Orthopedic Surgery Why: at 4:15 pm For suture removal, For wound re-check Contact information: [address] [place] [address] [phone]                Signed: Cristie Hem 01/31/2021, 8:31 AM

## 2021-02-01 ENCOUNTER — Telehealth: Payer: Self-pay | Admitting: Orthopaedic Surgery

## 2021-02-01 ENCOUNTER — Telehealth: Payer: Self-pay | Admitting: *Deleted

## 2021-02-01 DIAGNOSIS — Z471 Aftercare following joint replacement surgery: Secondary | ICD-10-CM | POA: Diagnosis not present

## 2021-02-01 DIAGNOSIS — Z96641 Presence of right artificial hip joint: Secondary | ICD-10-CM | POA: Diagnosis not present

## 2021-02-01 DIAGNOSIS — F17210 Nicotine dependence, cigarettes, uncomplicated: Secondary | ICD-10-CM | POA: Diagnosis not present

## 2021-02-01 DIAGNOSIS — E7800 Pure hypercholesterolemia, unspecified: Secondary | ICD-10-CM | POA: Diagnosis not present

## 2021-02-01 DIAGNOSIS — G7000 Myasthenia gravis without (acute) exacerbation: Secondary | ICD-10-CM | POA: Diagnosis not present

## 2021-02-01 DIAGNOSIS — I10 Essential (primary) hypertension: Secondary | ICD-10-CM | POA: Diagnosis not present

## 2021-02-01 DIAGNOSIS — Z7982 Long term (current) use of aspirin: Secondary | ICD-10-CM | POA: Diagnosis not present

## 2021-02-01 DIAGNOSIS — E119 Type 2 diabetes mellitus without complications: Secondary | ICD-10-CM | POA: Diagnosis not present

## 2021-02-01 DIAGNOSIS — M069 Rheumatoid arthritis, unspecified: Secondary | ICD-10-CM | POA: Diagnosis not present

## 2021-02-01 NOTE — Telephone Encounter (Signed)
You spoke to pharmacy correct?

## 2021-02-01 NOTE — Telephone Encounter (Signed)
Ortho bundle D/C call to patient. Issues with medication filling by pharmacy (Percocet). Call to pharmacy and spoke with pharmacist. Medication will be filled this morning for pick up. Reviewed all medications ordered for post-op.

## 2021-02-01 NOTE — Telephone Encounter (Signed)
Patient called advised the pharmacy would not fill the Rx for Oxycodone at the dosage of 325 mg. Patient said the pharmacy is waiting for a New Rx before they will fill it.  Patient said he is in a lot of pain. Patient said the pharmacy did not fill the stool softener or Tylenol. The number to contact patient is [phone]

## 2021-02-01 NOTE — Telephone Encounter (Signed)
I did. They were going to proceed with filling Oxycodone and he was going to get stool softener and ASA over the counter. Thanks. Patient is also aware.

## 2021-02-03 DIAGNOSIS — M069 Rheumatoid arthritis, unspecified: Secondary | ICD-10-CM | POA: Diagnosis not present

## 2021-02-03 DIAGNOSIS — E119 Type 2 diabetes mellitus without complications: Secondary | ICD-10-CM | POA: Diagnosis not present

## 2021-02-03 DIAGNOSIS — I10 Essential (primary) hypertension: Secondary | ICD-10-CM | POA: Diagnosis not present

## 2021-02-03 DIAGNOSIS — G7000 Myasthenia gravis without (acute) exacerbation: Secondary | ICD-10-CM | POA: Diagnosis not present

## 2021-02-03 DIAGNOSIS — Z7982 Long term (current) use of aspirin: Secondary | ICD-10-CM | POA: Diagnosis not present

## 2021-02-03 DIAGNOSIS — Z96641 Presence of right artificial hip joint: Secondary | ICD-10-CM | POA: Diagnosis not present

## 2021-02-03 DIAGNOSIS — F17210 Nicotine dependence, cigarettes, uncomplicated: Secondary | ICD-10-CM | POA: Diagnosis not present

## 2021-02-03 DIAGNOSIS — E7800 Pure hypercholesterolemia, unspecified: Secondary | ICD-10-CM | POA: Diagnosis not present

## 2021-02-03 DIAGNOSIS — Z471 Aftercare following joint replacement surgery: Secondary | ICD-10-CM | POA: Diagnosis not present

## 2021-02-05 ENCOUNTER — Other Ambulatory Visit: Payer: Self-pay | Admitting: Physician Assistant

## 2021-02-05 DIAGNOSIS — F17210 Nicotine dependence, cigarettes, uncomplicated: Secondary | ICD-10-CM | POA: Diagnosis not present

## 2021-02-05 DIAGNOSIS — M069 Rheumatoid arthritis, unspecified: Secondary | ICD-10-CM | POA: Diagnosis not present

## 2021-02-05 DIAGNOSIS — I10 Essential (primary) hypertension: Secondary | ICD-10-CM | POA: Diagnosis not present

## 2021-02-05 DIAGNOSIS — G7000 Myasthenia gravis without (acute) exacerbation: Secondary | ICD-10-CM | POA: Diagnosis not present

## 2021-02-05 DIAGNOSIS — E119 Type 2 diabetes mellitus without complications: Secondary | ICD-10-CM | POA: Diagnosis not present

## 2021-02-05 DIAGNOSIS — Z96641 Presence of right artificial hip joint: Secondary | ICD-10-CM | POA: Diagnosis not present

## 2021-02-05 DIAGNOSIS — Z7982 Long term (current) use of aspirin: Secondary | ICD-10-CM | POA: Diagnosis not present

## 2021-02-05 DIAGNOSIS — Z471 Aftercare following joint replacement surgery: Secondary | ICD-10-CM | POA: Diagnosis not present

## 2021-02-05 DIAGNOSIS — E7800 Pure hypercholesterolemia, unspecified: Secondary | ICD-10-CM | POA: Diagnosis not present

## 2021-02-07 ENCOUNTER — Telehealth: Payer: Self-pay | Admitting: *Deleted

## 2021-02-07 DIAGNOSIS — E7800 Pure hypercholesterolemia, unspecified: Secondary | ICD-10-CM | POA: Diagnosis not present

## 2021-02-07 DIAGNOSIS — E119 Type 2 diabetes mellitus without complications: Secondary | ICD-10-CM | POA: Diagnosis not present

## 2021-02-07 DIAGNOSIS — F17210 Nicotine dependence, cigarettes, uncomplicated: Secondary | ICD-10-CM | POA: Diagnosis not present

## 2021-02-07 DIAGNOSIS — Z96641 Presence of right artificial hip joint: Secondary | ICD-10-CM | POA: Diagnosis not present

## 2021-02-07 DIAGNOSIS — M069 Rheumatoid arthritis, unspecified: Secondary | ICD-10-CM | POA: Diagnosis not present

## 2021-02-07 DIAGNOSIS — Z471 Aftercare following joint replacement surgery: Secondary | ICD-10-CM | POA: Diagnosis not present

## 2021-02-07 DIAGNOSIS — I10 Essential (primary) hypertension: Secondary | ICD-10-CM | POA: Diagnosis not present

## 2021-02-07 DIAGNOSIS — Z7982 Long term (current) use of aspirin: Secondary | ICD-10-CM | POA: Diagnosis not present

## 2021-02-07 DIAGNOSIS — G7000 Myasthenia gravis without (acute) exacerbation: Secondary | ICD-10-CM | POA: Diagnosis not present

## 2021-02-07 NOTE — Telephone Encounter (Signed)
Ortho bundle 7 day call completed. 

## 2021-02-12 ENCOUNTER — Encounter: Payer: Self-pay | Admitting: Orthopaedic Surgery

## 2021-02-12 DIAGNOSIS — G7000 Myasthenia gravis without (acute) exacerbation: Secondary | ICD-10-CM | POA: Diagnosis not present

## 2021-02-12 DIAGNOSIS — E119 Type 2 diabetes mellitus without complications: Secondary | ICD-10-CM | POA: Diagnosis not present

## 2021-02-12 DIAGNOSIS — I10 Essential (primary) hypertension: Secondary | ICD-10-CM | POA: Diagnosis not present

## 2021-02-12 DIAGNOSIS — Z96641 Presence of right artificial hip joint: Secondary | ICD-10-CM | POA: Diagnosis not present

## 2021-02-12 DIAGNOSIS — Z471 Aftercare following joint replacement surgery: Secondary | ICD-10-CM | POA: Diagnosis not present

## 2021-02-12 DIAGNOSIS — Z7982 Long term (current) use of aspirin: Secondary | ICD-10-CM | POA: Diagnosis not present

## 2021-02-12 DIAGNOSIS — F17210 Nicotine dependence, cigarettes, uncomplicated: Secondary | ICD-10-CM | POA: Diagnosis not present

## 2021-02-12 DIAGNOSIS — M069 Rheumatoid arthritis, unspecified: Secondary | ICD-10-CM | POA: Diagnosis not present

## 2021-02-12 DIAGNOSIS — E7800 Pure hypercholesterolemia, unspecified: Secondary | ICD-10-CM | POA: Diagnosis not present

## 2021-02-13 ENCOUNTER — Encounter: Payer: Self-pay | Admitting: Orthopaedic Surgery

## 2021-02-13 ENCOUNTER — Ambulatory Visit (INDEPENDENT_AMBULATORY_CARE_PROVIDER_SITE_OTHER): Payer: Medicare PPO | Admitting: Orthopaedic Surgery

## 2021-02-13 ENCOUNTER — Other Ambulatory Visit: Payer: Self-pay

## 2021-02-13 ENCOUNTER — Ambulatory Visit (INDEPENDENT_AMBULATORY_CARE_PROVIDER_SITE_OTHER): Payer: Medicare PPO

## 2021-02-13 DIAGNOSIS — M1611 Unilateral primary osteoarthritis, right hip: Secondary | ICD-10-CM

## 2021-02-13 MED ORDER — METHOCARBAMOL 500 MG PO TABS: 500.0000 mg | ORAL_TABLET | Freq: Three times a day (TID) | ORAL | 0 refills | Status: DC | PRN

## 2021-02-13 MED ORDER — MUPIROCIN 2 % EX OINT: 1.0000 "application " | TOPICAL_OINTMENT | Freq: Two times a day (BID) | CUTANEOUS | 2 refills | Status: DC

## 2021-02-13 NOTE — Progress Notes (Signed)
Post-Op Visit Note   Patient: Jonathan Mcgee           Date of Birth: 01-29-1951           MRN: [id number] Visit Date: 02/13/2021 PCP: Shade Flood, MD   Assessment & Plan:  Chief Complaint:  Chief Complaint  Patient presents with  . Right Hip - Pain, Routine Post Op   Visit Diagnoses:  1. Primary osteoarthritis of right hip     Plan:   Patient is 2 weeks status post right total hip replacement.  He comes in for today for first postoperative appointment.  He is doing well overall.  He has been discharged from home health PT.  Currently ambulating with a walker.  He is using oxycodone sparingly.  Needs refill on Robaxin.  He just endorses some moderate soreness muscular pain.  No growing or hip pain with weightbearing.  Right hip shows a healed surgical incision without any evidence of infection.  Mild swelling.  No neurovascular compromise.  X-rays demonstrate stable total replacement.  No evidence of worsening of the nondisplaced acetabular fracture.  Sutures removed and Steri-Strips applied.  Mupirocin ointment prescribed for the incision.  Robaxin refilled.  25-month handicap placard provided today.  Implant card given.  Recheck in 4 weeks with standing AP pelvis x-rays.  Continue DVT prophylaxis with aspirin.  Increase activity as tolerated.  Follow-Up Instructions: Return in about 4 weeks (around 03/13/2021).   Orders:  Orders Placed This Encounter  Procedures  . XR HIP UNILAT W OR W/O PELVIS 2-3 VIEWS RIGHT   Meds ordered this encounter  Medications  . mupirocin ointment (BACTROBAN) 2 %    Sig: Apply 1 application topically 2 (two) times daily.    Dispense:  22 g    Refill:  2  . methocarbamol (ROBAXIN) 500 MG tablet    Sig: Take 1 tablet (500 mg total) by mouth every 8 (eight) hours as needed.    Dispense:  40 tablet    Refill:  0    Imaging: XR HIP UNILAT W OR W/O PELVIS 2-3 VIEWS RIGHT  Result Date: 02/13/2021 Stable right total hip replacement  without complication.  Stable nondisplaced acetabular fracture   PMFS History: Patient Active Problem List   Diagnosis Date Noted  . 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 DISH (diffuse idiopathic skeletal hyperostosis) 11/29/2020  . Primary osteoarthritis of right hip 11/14/2020  . Rheumatoid arthritis (HCC) 09/23/2019  . Morbid obesity (HCC) 07/19/2019  . UTI (urinary tract infection) 02/20/2019  . Suspected COVID-19 virus infection 02/20/2019  . Type 2 diabetes mellitus (HCC) 02/20/2019  . Sepsis (HCC) 02/19/2019  . CKD (chronic kidney disease) 04/12/2017  . Essential hypertension 01/13/2017  . Hyperlipidemia 01/13/2017  . Myasthenia gravis (HCC) 08/28/2016   Past Medical History:  Diagnosis Date  . Arthritis   . Diabetes mellitus without complication (HCC)   . Hypercholesterolemia   . Hypertension   . Myasthenia gravis (HCC)   . Rheumatoid arthritis (HCC)     Family History  Problem Relation Age of Onset  . Diabetes Mother   . Heart disease Mother   . Hyperlipidemia Mother   . Heart attack Mother   . Heart murmur Mother   . Hyperlipidemia Father   . Kidney failure Father   . Drug abuse Maternal Grandmother   . Heart disease Maternal Grandmother   . Hypertension Maternal Grandmother   . Hyperlipidemia Maternal Grandmother   . Diabetes Maternal Grandmother  Past Surgical History:  Procedure Laterality Date  . CATARACT EXTRACTION, BILATERAL  2020  . KNEE SURGERY Right    fractured patella > 20 yrs ago  . PENILE PROSTHESIS IMPLANT  2015  . TOTAL HIP ARTHROPLASTY Right 01/29/2021   Procedure: RIGHT TOTAL HIP ARTHROPLASTY ANTERIOR APPROACH;  Surgeon: Tarry Kos, MD;  Location: [facility] OR;  Service: Orthopedics;  Laterality: Right;   Social History   Occupational History    Comment: [employer]  Tobacco Use  . Smoking status: Current Some Day Smoker    Packs/day: 1.00    Types: Cigars  . Smokeless tobacco: Never Used  Vaping Use  . Vaping Use: Never  used  Substance and Sexual Activity  . Alcohol use: Not Currently    Comment: occas  . Drug use: No  . Sexual activity: Not on file

## 2021-02-16 ENCOUNTER — Telehealth: Payer: Self-pay | Admitting: *Deleted

## 2021-02-16 NOTE — Telephone Encounter (Signed)
Ortho bundle 14 day call completed. 

## 2021-02-28 ENCOUNTER — Other Ambulatory Visit: Payer: Self-pay

## 2021-02-28 ENCOUNTER — Encounter: Payer: Self-pay | Admitting: Internal Medicine

## 2021-02-28 ENCOUNTER — Ambulatory Visit: Payer: Medicare PPO | Admitting: Internal Medicine

## 2021-02-28 VITALS — BP 132/69 | HR 81 | Ht 69.5 in | Wt 206.6 lb

## 2021-02-28 DIAGNOSIS — G7000 Myasthenia gravis without (acute) exacerbation: Secondary | ICD-10-CM

## 2021-02-28 DIAGNOSIS — M1611 Unilateral primary osteoarthritis, right hip: Secondary | ICD-10-CM | POA: Diagnosis not present

## 2021-02-28 DIAGNOSIS — M069 Rheumatoid arthritis, unspecified: Secondary | ICD-10-CM | POA: Diagnosis not present

## 2021-02-28 NOTE — Progress Notes (Signed)
Office Visit Note  Patient: Jonathan Mcgee             Date of Birth: 1951-01-07           MRN: [id number]             PCP: Shade Flood, MD Referring: Shade Flood, MD Visit Date: 02/28/2021   Subjective:  Follow-up (Patient complains of bilateral shoulder discomfort and weakness. )   History of Present Illness: Jonathan Mcgee is a 70 y.o. male here for follow up for rheumatoid arthritis currently on prednisone 30 mg daily for myasthenia gravis indication. Since last visit he underwent planned right hip replacement surgery and is recovering from this and doing home therapy exercises. He has some swelling around this area and does not have his regular level of strength back yet but progressing. He also has bilateral shoulder soreness today. He has not noticed significant joint swelling.   Review of Systems  Constitutional: Positive for fatigue.  HENT: Negative for mouth sores, mouth dryness and nose dryness.   Eyes: Positive for visual disturbance. Negative for pain, itching and dryness.  Respiratory: Negative for cough, hemoptysis, shortness of breath and difficulty breathing.   Cardiovascular: Positive for swelling in legs/feet. Negative for chest pain and palpitations.  Gastrointestinal: Negative for abdominal pain, blood in stool, constipation and diarrhea.  Endocrine: Negative for increased urination.  Genitourinary: Negative for painful urination.  Musculoskeletal: Positive for arthralgias, joint pain, myalgias, muscle weakness, morning stiffness, muscle tenderness and myalgias. Negative for joint swelling.  Skin: Positive for color change. Negative for rash and redness.  Allergic/Immunologic: Negative for susceptible to infections.  Neurological: Negative for dizziness, numbness, headaches, memory loss and weakness.  Hematological: Negative for swollen glands.  Psychiatric/Behavioral: Positive for sleep disturbance. Negative for confusion.    Previous  HPI: Jonathan Mcgee is a 70 y.o. male with a history of myasthenia gravis, generalized osteoarthritis, and DISH here for evaluation of RA. Previously seen by Dr. Nickola Major since.a few months ago for these problems most recently in December of last year. His RA diagnosis starts around hospitalization in 2020 with severe lower extremity and shoulder pain and swelling evaluated by Dr. Nydia Bouton with [facility]. His most problematic joint pain is the right hip and has had weakness in this leg with gait instability requiring him to use a cane. He was later seen in outpatient clinic locally and started on methotrexate although not clear if he was having active joint inflammation or more OA related pain. His prednisone was increased to  daily and received course of IVIG treatment in November and December and methotrexate was discontinued since he did not have active inflammatory disease with rheumatology visit in December. Currently he is on  prednisone slightly tapering his dose. He is having significant swelling in his legs.  PMFS History:  Patient Active Problem List   Diagnosis Date Noted  . 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 DISH (diffuse idiopathic skeletal hyperostosis) 11/29/2020  . Primary osteoarthritis of right hip 11/14/2020  . Rheumatoid arthritis (HCC) 09/23/2019  . Morbid obesity (HCC) 07/19/2019  . UTI (urinary tract infection) 02/20/2019  . Suspected COVID-19 virus infection 02/20/2019  . Type 2 diabetes mellitus (HCC) 02/20/2019  . Sepsis (HCC) 02/19/2019  . CKD (chronic kidney disease) 04/12/2017  . Essential hypertension 01/13/2017  . Hyperlipidemia 01/13/2017  . Myasthenia gravis (HCC) 08/28/2016    Past Medical History:  Diagnosis Date  . Arthritis   . Diabetes mellitus  without complication (HCC)   . Hypercholesterolemia   . Hypertension   . Myasthenia gravis (HCC)   . Rheumatoid arthritis (HCC)     Family History  Problem Relation Age of Onset  . Diabetes  Mother   . Heart disease Mother   . Hyperlipidemia Mother   . Heart attack Mother   . Heart murmur Mother   . Hyperlipidemia Father   . Kidney failure Father   . Drug abuse Maternal Grandmother   . Heart disease Maternal Grandmother   . Hypertension Maternal Grandmother   . Hyperlipidemia Maternal Grandmother   . Diabetes Maternal Grandmother    Past Surgical History:  Procedure Laterality Date  . CATARACT EXTRACTION, BILATERAL  2020  . KNEE SURGERY Right    fractured patella > 20 yrs ago  . PENILE PROSTHESIS IMPLANT  2015  . TOTAL HIP ARTHROPLASTY Right 01/29/2021   Procedure: RIGHT TOTAL HIP ARTHROPLASTY ANTERIOR APPROACH;  Surgeon: Tarry Kos, MD;  Location: [facility] OR;  Service: Orthopedics;  Laterality: Right;   Social History   Social History Narrative   Drinks 2-3 caffeine drinks a day    Left handed   One story    Immunization History  Administered Date(s) Administered  . Hepatitis B, adult 07/18/2016  . PFIZER(Purple Top)SARS-COV-2 Vaccination 01/09/2020, 02/01/2020, 08/19/2020     Objective: Vital Signs: BP 132/69 (BP Location: Right Arm, Patient Position: Sitting, Cuff Size: Normal)   Pulse 81   Ht 5' 9.5" (1.765 m)   Wt 206 lb 9.6 oz (93.7 kg)   BMI 30.07 kg/m    Physical Exam Skin:    General: Skin is warm and dry.     Comments: Closed surgical scar with sutures on right hip, soft tissue swelling and induration present without erythema or ecchymoses  Neurological:     General: No focal deficit present.     Mental Status: He is alert.     Musculoskeletal Exam:  Neck ROM reduced in all directions Shoulders full ROM, tenderness with movement but not on palpation Elbows full ROM no tenderness or swelling, prominent olecranon processes Wrists full ROM no tenderness or swelling Fingers full ROM no tenderness or swelling  Investigation: No additional findings.  Imaging: XR HIP UNILAT W OR W/O PELVIS 2-3 VIEWS RIGHT  Result Date: 02/13/2021 Stable  right total hip replacement without complication.  Stable nondisplaced acetabular fracture   Recent Labs: Lab Results  Component Value Date   WBC 15.0 (H) 01/31/2021   HGB 7.9 (L) 01/31/2021   PLT 208 01/31/2021   NA 138 01/30/2021   K 4.0 01/30/2021   CL 105 01/30/2021   CO2 22 01/30/2021   GLUCOSE 134 (H) 01/30/2021   BUN 29 (H) 01/30/2021   CREATININE 1.87 (H) 01/30/2021   BILITOT 0.7 01/25/2021   ALKPHOS 46 01/25/2021   AST 18 01/25/2021   ALT 14 01/25/2021   PROT 6.6 01/25/2021   ALBUMIN 3.7 01/25/2021   CALCIUM 8.5 (L) 01/30/2021   GFRAA 50 (L) 01/15/2021    Speciality Comments: No specialty comments available.  Procedures:  No procedures performed Allergies: Lisinopril   Assessment / Plan:     Visit Diagnoses: Rheumatoid arthritis, involving unspecified site, unspecified whether rheumatoid factor present (HCC)  No inflammatory joint change seen on exam today. Remains unclear to me RA disease controlled on steroids currently versus just seropositive without active disease. No need to start additional DMARD at this time, he prefers closer observation rather than PRN for symptoms increasing which is  reasonable so 71mo f/u.  Primary osteoarthritis of right hip  Now s/p arthroplasty, recovering and wound looks intact.  Myasthenia gravis (HCC)  Tapering prednisone dose slowly over time for this his relative weakness in upper extremities does not seem severe or concerning for crisis. Treatment continues to encompass an amount adequate for RA as well.  Orders: No orders of the defined types were placed in this encounter.  No orders of the defined types were placed in this encounter.   Follow-Up Instructions: Return in about 3 months (around 05/30/2021) for Rheumatoid arthritis, observing (on Rx for MG).   Fuller Plan, MD  Note - This record has been created using [employer].  Chart creation errors have been sought, but may not always  have been  located. Such creation errors do not reflect on  the standard of medical care.

## 2021-03-05 ENCOUNTER — Other Ambulatory Visit: Payer: Self-pay

## 2021-03-05 ENCOUNTER — Telehealth: Payer: Self-pay

## 2021-03-05 DIAGNOSIS — R609 Edema, unspecified: Secondary | ICD-10-CM

## 2021-03-05 MED ORDER — FUROSEMIDE 20 MG PO TABS: 20.0000 mg | ORAL_TABLET | Freq: Every day | ORAL | 0 refills | Status: DC

## 2021-03-05 NOTE — Telephone Encounter (Signed)
MEDICATION: Furosemide 20 MG  PHARMACY: [facility] [address]  Comments:   **Let patient know to contact pharmacy at the end of the day to make sure medication is ready. **  ** Please notify patient to allow 48-72 hours to process**  **Encourage patient to contact the pharmacy for refills or they can request refills through [facility]**

## 2021-03-06 ENCOUNTER — Telehealth: Payer: Self-pay | Admitting: *Deleted

## 2021-03-06 NOTE — Telephone Encounter (Signed)
Ortho bundle 30 day call completed with survey. 

## 2021-03-13 ENCOUNTER — Ambulatory Visit (INDEPENDENT_AMBULATORY_CARE_PROVIDER_SITE_OTHER): Payer: Medicare PPO

## 2021-03-13 ENCOUNTER — Ambulatory Visit (INDEPENDENT_AMBULATORY_CARE_PROVIDER_SITE_OTHER): Payer: Medicare PPO | Admitting: Orthopaedic Surgery

## 2021-03-13 ENCOUNTER — Other Ambulatory Visit: Payer: Self-pay

## 2021-03-13 DIAGNOSIS — Z96641 Presence of right artificial hip joint: Secondary | ICD-10-CM

## 2021-03-13 NOTE — Progress Notes (Signed)
Post-Op Visit Note   Patient: Jonathan Mcgee           Date of Birth: 30-Dec-1950           MRN: [id number] Visit Date: 03/13/2021 PCP: Shade Flood, MD   Assessment & Plan:  Chief Complaint:  Chief Complaint  Patient presents with  . Right Hip - Follow-up   Visit Diagnoses:  1. S/P total right hip arthroplasty   2. Status post total replacement of right hip     Plan:   Jonathan Mcgee is 6 weeks status post right total knee replacement on 01/29/2021.  He describes good and bad days improving from the hip replacement.  He is doing his home exercises.  He feels that he has noticeable weakness in his hip muscles.  Right hip surgical scar is healed.  No signs of infection.  Unremarkable x-rays of the hip replacement.  The posterior acetabular fracture has healed.  Patient is doing well at this time in his recovery.  He seems to be quite stressed with multiple things going on in his life both socially and medically.  He seems to be pleased with his hip replacement at least and the pain relief that it has provided him.  We went over dental prophylaxis and activity recommendations.  We will see him back in 6 weeks for AP pelvis and lateral hip.  Follow-Up Instructions: Return in about 6 weeks (around 04/24/2021).   Orders:  Orders Placed This Encounter  Procedures  . XR Pelvis 1-2 Views   No orders of the defined types were placed in this encounter.   Imaging: XR Pelvis 1-2 Views  Result Date: 03/13/2021 Stable total hip replacement without complications.  Degenerative changes of the left hip joint.   PMFS History: Patient Active Problem List   Diagnosis Date Noted  . S/P total right hip arthroplasty 03/13/2021  . 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 DISH (diffuse idiopathic skeletal hyperostosis) 11/29/2020  . Primary osteoarthritis of right hip 11/14/2020  . Rheumatoid arthritis (HCC) 09/23/2019  . Morbid obesity (HCC) 07/19/2019  . UTI  (urinary tract infection) 02/20/2019  . Suspected COVID-19 virus infection 02/20/2019  . Type 2 diabetes mellitus (HCC) 02/20/2019  . Sepsis (HCC) 02/19/2019  . CKD (chronic kidney disease) 04/12/2017  . Essential hypertension 01/13/2017  . Hyperlipidemia 01/13/2017  . Myasthenia gravis (HCC) 08/28/2016   Past Medical History:  Diagnosis Date  . Arthritis   . Diabetes mellitus without complication (HCC)   . Hypercholesterolemia   . Hypertension   . Myasthenia gravis (HCC)   . Rheumatoid arthritis (HCC)     Family History  Problem Relation Age of Onset  . Diabetes Mother   . Heart disease Mother   . Hyperlipidemia Mother   . Heart attack Mother   . Heart murmur Mother   . Hyperlipidemia Father   . Kidney failure Father   . Drug abuse Maternal Grandmother   . Heart disease Maternal Grandmother   . Hypertension Maternal Grandmother   . Hyperlipidemia Maternal Grandmother   . Diabetes Maternal Grandmother     Past Surgical History:  Procedure Laterality Date  . CATARACT EXTRACTION, BILATERAL  2020  . KNEE SURGERY Right    fractured patella > 20 yrs ago  . PENILE PROSTHESIS IMPLANT  2015  . TOTAL HIP ARTHROPLASTY Right 01/29/2021   Procedure: RIGHT TOTAL HIP ARTHROPLASTY ANTERIOR APPROACH;  Surgeon: Tarry Kos, MD;  Location: [facility] OR;  Service: Orthopedics;  Laterality: Right;   Social History   Occupational History    Comment: [employer]  Tobacco Use  . Smoking status: Current Some Day Smoker    Packs/day: 1.00    Types: Cigars  . Smokeless tobacco: Never Used  Vaping Use  . Vaping Use: Never used  Substance and Sexual Activity  . Alcohol use: Yes    Comment: Rarely  . Drug use: No  . Sexual activity: Not on file

## 2021-03-19 ENCOUNTER — Other Ambulatory Visit: Payer: Self-pay | Admitting: *Deleted

## 2021-03-19 MED ORDER — PYRIDOSTIGMINE BROMIDE 60 MG PO TABS: 60.0000 mg | ORAL_TABLET | Freq: Three times a day (TID) | ORAL | 3 refills | Status: DC

## 2021-03-23 ENCOUNTER — Ambulatory Visit: Payer: Medicare PPO | Admitting: Orthopaedic Surgery

## 2021-03-27 ENCOUNTER — Ambulatory Visit: Payer: Medicare PPO | Admitting: Orthopedic Surgery

## 2021-03-27 DIAGNOSIS — I878 Other specified disorders of veins: Secondary | ICD-10-CM | POA: Diagnosis not present

## 2021-03-27 DIAGNOSIS — M2021 Hallux rigidus, right foot: Secondary | ICD-10-CM | POA: Diagnosis not present

## 2021-03-27 DIAGNOSIS — M2022 Hallux rigidus, left foot: Secondary | ICD-10-CM | POA: Diagnosis not present

## 2021-03-27 DIAGNOSIS — Z96641 Presence of right artificial hip joint: Secondary | ICD-10-CM

## 2021-03-28 ENCOUNTER — Encounter: Payer: Self-pay | Admitting: Orthopedic Surgery

## 2021-03-28 NOTE — Progress Notes (Signed)
Office Visit Note   Patient: Jonathan Mcgee           Date of Birth: 08-17-1951           MRN: [id number] Visit Date: 03/27/2021              Requested by: Shade Flood, MD [address] [place],  [place] [address] PCP: Shade Flood, MD  Chief Complaint  Patient presents with  . Right Foot - Pain  . Left Foot - Pain      HPI: Patient is a 70 year old gentleman who has a history of myasthenia gravis currently taking Lasix and prednisone.  Patient is status post a total hip arthroplasty patient states he is currently ambulating with a cane for the past 8 months due to pain and swelling of both lower extremities.  Patient states he started losing the right great toenail.  He complains of discoloration of both lower extremities.  Assessment & Plan: Visit Diagnoses:  1. S/P total right hip arthroplasty   2. Hallux rigidus, left foot   3. Hallux rigidus, right foot   4. Venous stasis of both lower extremities     Plan: Recommended knee-high compression stockings for the venous insufficiency.  For the hallux rigidus recommended a stiff soled sneaker over-the-counter orthotics such as sole and a carbon plate to unload the first MTP joint.  Follow-Up Instructions: Return if symptoms worsen or fail to improve.   Ortho Exam  Patient is alert, oriented, no adenopathy, well-dressed, normal affect, normal respiratory effort. Examination patient has a good dorsalis pedis pulse bilaterally there is pitting edema both lower extremities but no ulcers no cellulitis.  Patient has dorsiflexion to neutral bilaterally.  He has significant hallux rigidus bilaterally with dorsiflexion of only 30 degrees.  Left calf is 38 cm in circumference right calf is 35 cm in circumference  Imaging: No results found. No images are attached to the encounter.  Labs: Lab Results  Component Value Date   HGBA1C 7.1 (H) 01/15/2021   HGBA1C 8.1 (H) 10/13/2020   HGBA1C 7.2 (H) 07/19/2020    ESRSEDRATE 107 (H) 02/20/2019   ESRSEDRATE 52 (H) 02/19/2019   ESRSEDRATE 38 (H) 03/24/2018   CRP 9.2 (H) 02/20/2019   LABURIC 4.7 03/24/2018   REPTSTATUS 02/21/2019 FINAL 02/19/2019   CULT  02/19/2019    NO GROWTH Performed at [facility], [address] [place], [place] [address]      Lab Results  Component Value Date   ALBUMIN 3.7 01/25/2021   ALBUMIN 4.3 01/15/2021   ALBUMIN 4.4 10/13/2020    No results found for: MG No results found for: VD25OH  No results found for: PREALBUMIN CBC EXTENDED Latest Ref Rng & Units 01/31/2021 01/30/2021 01/25/2021  WBC 4.0 - 10.5 K/uL 15.0(H) 14.7(H) 13.4(H)  RBC 4.22 - 5.81 MIL/uL 2.44(L) 2.41(L) 3.32(L)  HGB 13.0 - 17.0 g/dL 7.9(L) 8.0(L) 10.5(L)  HCT 39.0 - 52.0 % 23.1(L) 22.7(L) 32.9(L)  PLT 150 - 400 K/uL 208 198 257  NEUTROABS 1.7 - 7.7 K/uL - - 12.0(H)  LYMPHSABS 0.7 - 4.0 K/uL - - 0.5(L)     There is no height or weight on file to calculate BMI.  Orders:  No orders of the defined types were placed in this encounter.  No orders of the defined types were placed in this encounter.    Procedures: No procedures performed  Clinical Data: No additional findings.  ROS:  All other systems negative,  except as noted in the HPI. Review of Systems  Objective: Vital Signs: There were no vitals taken for this visit.  Specialty Comments:  No specialty comments available.  PMFS History: Patient Active Problem List   Diagnosis Date Noted  . S/P total right hip arthroplasty 03/13/2021  . 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 DISH (diffuse idiopathic skeletal hyperostosis) 11/29/2020  . Primary osteoarthritis of right hip 11/14/2020  . Rheumatoid arthritis (HCC) 09/23/2019  . Morbid obesity (HCC) 07/19/2019  . UTI (urinary tract infection) 02/20/2019  . Suspected COVID-19 virus infection 02/20/2019  . Type 2 diabetes mellitus (HCC) 02/20/2019  . Sepsis (HCC) 02/19/2019  . CKD (chronic kidney  disease) 04/12/2017  . Essential hypertension 01/13/2017  . Hyperlipidemia 01/13/2017  . Myasthenia gravis (HCC) 08/28/2016   Past Medical History:  Diagnosis Date  . Arthritis   . Diabetes mellitus without complication (HCC)   . Hypercholesterolemia   . Hypertension   . Myasthenia gravis (HCC)   . Rheumatoid arthritis (HCC)     Family History  Problem Relation Age of Onset  . Diabetes Mother   . Heart disease Mother   . Hyperlipidemia Mother   . Heart attack Mother   . Heart murmur Mother   . Hyperlipidemia Father   . Kidney failure Father   . Drug abuse Maternal Grandmother   . Heart disease Maternal Grandmother   . Hypertension Maternal Grandmother   . Hyperlipidemia Maternal Grandmother   . Diabetes Maternal Grandmother     Past Surgical History:  Procedure Laterality Date  . CATARACT EXTRACTION, BILATERAL  2020  . KNEE SURGERY Right    fractured patella > 20 yrs ago  . PENILE PROSTHESIS IMPLANT  2015  . TOTAL HIP ARTHROPLASTY Right 01/29/2021   Procedure: RIGHT TOTAL HIP ARTHROPLASTY ANTERIOR APPROACH;  Surgeon: Tarry Kos, MD;  Location: [facility] OR;  Service: Orthopedics;  Laterality: Right;   Social History   Occupational History    Comment: [employer]  Tobacco Use  . Smoking status: Current Some Day Smoker    Packs/day: 1.00    Types: Cigars  . Smokeless tobacco: Never Used  Vaping Use  . Vaping Use: Never used  Substance and Sexual Activity  . Alcohol use: Yes    Comment: Rarely  . Drug use: No  . Sexual activity: Not on file

## 2021-03-30 ENCOUNTER — Other Ambulatory Visit: Payer: Self-pay

## 2021-03-30 ENCOUNTER — Ambulatory Visit: Payer: Self-pay

## 2021-03-30 ENCOUNTER — Encounter: Payer: Self-pay | Admitting: Orthopaedic Surgery

## 2021-03-30 ENCOUNTER — Ambulatory Visit (INDEPENDENT_AMBULATORY_CARE_PROVIDER_SITE_OTHER): Payer: Medicare PPO | Admitting: Orthopaedic Surgery

## 2021-03-30 DIAGNOSIS — Z96641 Presence of right artificial hip joint: Secondary | ICD-10-CM

## 2021-04-02 NOTE — Progress Notes (Signed)
Left

## 2021-04-09 ENCOUNTER — Other Ambulatory Visit: Payer: Self-pay

## 2021-04-09 ENCOUNTER — Encounter: Payer: Self-pay | Admitting: Family Medicine

## 2021-04-09 ENCOUNTER — Ambulatory Visit: Payer: Medicare PPO | Admitting: Family Medicine

## 2021-04-19 ENCOUNTER — Other Ambulatory Visit: Payer: Self-pay | Admitting: Family Medicine

## 2021-04-19 DIAGNOSIS — I10 Essential (primary) hypertension: Secondary | ICD-10-CM

## 2021-04-24 ENCOUNTER — Ambulatory Visit: Payer: Self-pay

## 2021-04-24 ENCOUNTER — Encounter: Payer: Self-pay | Admitting: Orthopaedic Surgery

## 2021-04-24 ENCOUNTER — Other Ambulatory Visit: Payer: Self-pay

## 2021-04-24 ENCOUNTER — Ambulatory Visit (INDEPENDENT_AMBULATORY_CARE_PROVIDER_SITE_OTHER): Payer: Medicare PPO | Admitting: Orthopaedic Surgery

## 2021-04-24 ENCOUNTER — Ambulatory Visit (INDEPENDENT_AMBULATORY_CARE_PROVIDER_SITE_OTHER): Payer: Medicare PPO

## 2021-04-24 ENCOUNTER — Encounter: Payer: Self-pay | Admitting: Family Medicine

## 2021-04-24 VITALS — Ht 69.5 in | Wt 206.0 lb

## 2021-04-24 DIAGNOSIS — M25512 Pain in left shoulder: Secondary | ICD-10-CM | POA: Diagnosis not present

## 2021-04-24 DIAGNOSIS — M25511 Pain in right shoulder: Secondary | ICD-10-CM | POA: Diagnosis not present

## 2021-04-24 DIAGNOSIS — G8929 Other chronic pain: Secondary | ICD-10-CM | POA: Diagnosis not present

## 2021-04-24 DIAGNOSIS — Z96641 Presence of right artificial hip joint: Secondary | ICD-10-CM | POA: Diagnosis not present

## 2021-04-24 NOTE — Progress Notes (Signed)
Office Visit Note   Patient: Jonathan Mcgee           Date of Birth: [birth year]           MRN: [id number] Visit Date: 04/24/2021              Requested by: Shade Flood, MD [address] [place],  [place] [address] PCP: Shade Flood, MD   Assessment & Plan: Visit Diagnoses:  1. S/P total right hip arthroplasty   2. Chronic pain of both shoulders     Plan: In regards to the right hip replacement he has done well he will continue to increase activity as tolerated.  We will see him back in 3 months for follow-up and repeat x-rays.  In terms of the shoulders I believe that he has rotator cuff arthropathy.  He would like to do home exercises for now.  We did send him upstairs to see Dr. Prince Rome about a possible shoulder injection.  Follow-Up Instructions: Return in about 3 months (around 07/25/2021).   Orders:  Orders Placed This Encounter  Procedures  . XR HIP UNILAT W OR W/O PELVIS 2-3 VIEWS RIGHT  . XR Shoulder Left  . XR Shoulder Right  . US Guided Needle Placement - No Linked Charges   No orders of the defined types were placed in this encounter.     Procedures: No procedures performed   Clinical Data: No additional findings.   Subjective: Chief Complaint  Patient presents with  . Right Knee - Follow-up    Right total knee arthroplasty 01/29/2021  . Right Shoulder - Pain  . Left Shoulder - Pain    Patient is approximately 3 months status post right total hip replacement.  He is doing well in that regard complaints other than some tightness.  He walks with a cane.  He also has bilateral shoulder pain that is left greater than right for several months without any injuries.  Denies any numbness and tingling or radicular symptoms.  He is shoulder with decreased range of motion.  He has increased pain with lifting of the head.   Review of Systems  Constitutional: Negative.   All other systems reviewed and are negative.    Objective: Vital Signs:  Ht 5' 9.5" (1.765 m)   Wt 206 lb (93.4 kg)   BMI 29.98 kg/m   Physical Exam Vitals and nursing note reviewed.  Constitutional:      Appearance: He is well-developed.  Pulmonary:     Effort: Pulmonary effort is normal.  Abdominal:     Palpations: Abdomen is soft.  Skin:    General: Skin is warm.  Neurological:     Mental Status: He is alert and oriented to person, place, and time.  Psychiatric:        Behavior: Behavior normal.        Thought Content: Thought content normal.        Judgment: Judgment normal.     Ortho Exam Right hip shows a fully healed surgical scar.  He has adequate range of motion without pain.  Strength is improving from the surgery.  Both shoulders show 4-5 weakness to testing of the infraspinatus, supraspinatus, subscapularis.  Range of motion is mildly restricted secondary to pain.  There is slight crepitus of the glenohumeral joint with range of motion.  Specialty Comments:  No specialty comments available.  Imaging: XR HIP UNILAT W OR W/O PELVIS 2-3 VIEWS RIGHT  Result Date: 04/24/2021  Stable right total hip replacement without complication.    XR Shoulder Left  Result Date: 04/24/2021 Moderate degenerative changes of the glenohumeral joint.  XR Shoulder Right  Result Date: 04/24/2021 Degenerative changes consistent with rotator cuff arthropathy.    PMFS History: Patient Active Problem List   Diagnosis Date Noted  . S/P total right hip arthroplasty 03/13/2021  . 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 DISH (diffuse idiopathic skeletal hyperostosis) 11/29/2020  . Primary osteoarthritis of right hip 11/14/2020  . Rheumatoid arthritis (HCC) 09/23/2019  . Morbid obesity (HCC) 07/19/2019  . UTI (urinary tract infection) 02/20/2019  . Suspected COVID-19 virus infection 02/20/2019  . Type 2 diabetes mellitus (HCC) 02/20/2019  . Sepsis (HCC) 02/19/2019  . CKD (chronic kidney disease) 04/12/2017  . Essential hypertension  01/13/2017  . Hyperlipidemia 01/13/2017  . Myasthenia gravis (HCC) 08/28/2016   Past Medical History:  Diagnosis Date  . Arthritis   . Diabetes mellitus without complication (HCC)   . Hypercholesterolemia   . Hypertension   . Myasthenia gravis (HCC)   . Rheumatoid arthritis (HCC)     Family History  Problem Relation Age of Onset  . Diabetes Mother   . Heart disease Mother   . Hyperlipidemia Mother   . Heart attack Mother   . Heart murmur Mother   . Hyperlipidemia Father   . Kidney failure Father   . Drug abuse Maternal Grandmother   . Heart disease Maternal Grandmother   . Hypertension Maternal Grandmother   . Hyperlipidemia Maternal Grandmother   . Diabetes Maternal Grandmother     Past Surgical History:  Procedure Laterality Date  . CATARACT EXTRACTION, BILATERAL  2020  . KNEE SURGERY Right    fractured patella > 20 yrs ago  . PENILE PROSTHESIS IMPLANT  2015  . TOTAL HIP ARTHROPLASTY Right 01/29/2021   Procedure: RIGHT TOTAL HIP ARTHROPLASTY ANTERIOR APPROACH;  Surgeon: Tarry Kos, MD;  Location: [facility] OR;  Service: Orthopedics;  Laterality: Right;   Social History   Occupational History    Comment: [employer]  Tobacco Use  . Smoking status: Current Some Day Smoker    Packs/day: 1.00    Types: Cigars  . Smokeless tobacco: Never Used  Vaping Use  . Vaping Use: Never used  Substance and Sexual Activity  . Alcohol use: Yes    Comment: Rarely  . Drug use: No  . Sexual activity: Not on file

## 2021-04-24 NOTE — Progress Notes (Signed)
He was here for a shoulder injection, but he states that his shoulder is really only bothering him when doing certain lifting activities, and even then the pain is not severe.  He would like to wait on injection and we instead decided to give him rotator cuff exercises to do at home.  He can return for glenohumeral injection if needed

## 2021-04-30 ENCOUNTER — Other Ambulatory Visit: Payer: Self-pay | Admitting: Family Medicine

## 2021-04-30 DIAGNOSIS — E1122 Type 2 diabetes mellitus with diabetic chronic kidney disease: Secondary | ICD-10-CM

## 2021-04-30 DIAGNOSIS — N181 Chronic kidney disease, stage 1: Secondary | ICD-10-CM

## 2021-05-07 ENCOUNTER — Encounter: Payer: Self-pay | Admitting: Diagnostic Neuroimaging

## 2021-05-07 ENCOUNTER — Other Ambulatory Visit: Payer: Self-pay

## 2021-05-07 ENCOUNTER — Ambulatory Visit: Payer: Medicare PPO | Admitting: Diagnostic Neuroimaging

## 2021-05-07 VITALS — BP 135/55 | HR 75 | Ht 69.5 in | Wt 199.8 lb

## 2021-05-07 DIAGNOSIS — G7000 Myasthenia gravis without (acute) exacerbation: Secondary | ICD-10-CM | POA: Diagnosis not present

## 2021-05-07 MED ORDER — PREDNISONE 10 MG PO TABS: 20.0000 mg | ORAL_TABLET | Freq: Every day | ORAL | 12 refills | Status: DC

## 2021-05-07 NOTE — Progress Notes (Signed)
[facility]  PATIENT: Jonathan Mcgee DOB: 04-24-1951  REFERRING CLINICIAN: Deliah Boston, PA-c HISTORY FROM: patient  REASON FOR VISIT: follow up    HISTORICAL  CHIEF COMPLAINT:  Chief Complaint  Patient presents with   Myasthenia gravis    Rm 6, 4 month FU  "right hip replacement in March, 10 days of PT; using cane due to swollen  feet, weak legs; get dizzy sometimes like I don't have enough blood getting to my head""    HISTORY OF PRESENT ILLNESS:   UPDATE (05/07/21, VRP): Since last visit, doing about the same. Now on prednisone 30mg  daily. Muscle cramps continue intermittently. No ptosis or double vision. Fatigue continues. Decr motivation and interest. Had right hip surgery and doing well in recovery. Bilateral foot swelling stable.  UPDATE (12/20/20, VRP): Since last visit, had IVIG in Sept 2021. Continues on mestinon and prednisone. Continues with right hip pain, mobility issues. Planning for right hip surgery in end of Feb 2022.   UPDATE (07/24/20, VRP): Since last visit, doing poorly; more mobility and weakness issues. Needs a cane now. Symptoms are progressive. Severity is increased. No alleviating or aggravating factors. Tolerating mestinon and prednisone.    UPDATE (01/18/20, VRP): Since last visit, more swelling in legs; rheumatology requesting to see if prednisone dosing can be reduced. Symptoms are progressive.  UPDATE (07/19/19, VRP): Since last visit, doing poorly (more fatigue, muscle weakness in arms). Had phone call with [facility] rheumatology but no other follow up. Prior joint pain and leg issues are improved. Double vision and swallowing issues are resolved. Tolerating pyridostigmine. Prednisone is completed.  UPDATE (02/24/19, VRP): Since last visit, had started on imuran 1 month ago. Then had fevers, pyuria, and weakness. Also had significant knee pain, and admitted to hospital (March 2020). Dx'd with rheumatoid arthritis and UTI; imuran was  stopped.  No focal or localized signs to suggest myasthenia gravis sedation or crisis.  Prednisone 10 mg was started and patient was referred to rheumatology outpatient.  Of note patient was tested for flu and nCoV both which were negative.  Patient now back home.  He is doing well.  No fevers.  He is completing his antibiotic course.  He is on prednisone 10 mg daily.  Pain is subsided. MG symptoms are stable since last visit. Breathing, chewing, swallowing stable.   UPDATE (01/18/19, VRP): Since last visit, doing slightly worse. Mild weakness in left leg. Slightly mild generalized fatigue and weakness. Worse with exertion. Symptoms are worse than 2019. No speech, swallow or breathing issues. Tolerating mestinon.   UPDATE (04/15/18, VRP): Since last visit, doing about the same with MG. Tolerating mestinon. No alleviating or aggravating factors. Now planning to move to [place] for work.  UPDATE 07/16/17: Since last visit, overall stable. Mild generalized fatigue continues. Overall stable. Notices some more muscle strain.   UPDATE 02/12/17: Since last visit, BP is better (meds have been adjusted). Now has CPAP machine (about to start). Sugars are better now (averaging ~150's). Intermittent double vision continues. Mild generalized fatigue, but no focal weakness.   UPDATE 12/31/16: Since last visit, IVIG completed, and general energy little better, but muscle cramps worse. BP running high. He has not checked sugars lately. PSG showed mod-severe OSA, and CPAP titration study is pending. Tolerating mestinon 60mg  BID.   UPDATE 10/09/16: Since last visit, double vision is slightly better on mestinon 60mg  TID. However having more muscle cramps, excess saliva production. Fatigable weakness is persistent.   PRIOR HPI (08/28/16): 70 year old ambidextrous  male with hypertension, diabetes, hypercholesterolemia here for evaluation of myasthenia gravis. For past 1-2 years patient has had mild intermittent right-sided  ptosis. In May 2017 he noticed onset of double vision, blurred vision, wavy vision. This is progressively worsened. For past 1 month he has had increasing fatigue with walking. Is having difficulty with upper and lower extremity strength. He denies any speech, swallowing, breathing difficulty. His balance has been slightly off. Patient went to PCP, ophthalmology, had [facility] antibody testing which confirmed diagnosis of myasthenia gravis.   REVIEW OF SYSTEMS: Full 14 system review of systems performed and negative with exception of: as per HPI.   ALLERGIES: Allergies  Allergen Reactions   Lisinopril Swelling    Facial swelling    HOME MEDICATIONS: Outpatient Medications Prior to Visit  Medication Sig Dispense Refill   amLODipine (NORVASC) 5 MG tablet TAKE 1 & 1/2 (ONE & ONE-HALF) TABLETS BY MOUTH ONCE DAILY 135 tablet 0   aspirin EC 81 MG tablet Take 1 tablet (81 mg total) by mouth 2 (two) times daily. To be taken after surgery 84 tablet 0   atorvastatin (LIPITOR) 20 MG tablet Take 1 tablet (20 mg total) by mouth daily. 90 tablet 3   docusate sodium (COLACE) 100 MG capsule Take 1 capsule (100 mg total) by mouth daily as needed. 30 capsule 2   furosemide (LASIX) 20 MG tablet Take 1 tablet (20 mg total) by mouth daily. 60 tablet 0   glipiZIDE (GLUCOTROL) 5 MG tablet Take 0.5 tablets (2.5 mg total) by mouth daily before breakfast. 90 tablet 1   hydrochlorothiazide (HYDRODIURIL) 25 MG tablet Take 1 tablet (25 mg total) by mouth daily. 90 tablet 1   losartan (COZAAR) 100 MG tablet Take 1 tablet (100 mg total) by mouth daily. 90 tablet 1   metFORMIN (GLUCOPHAGE) 1000 MG tablet TAKE 1 TABLET BY MOUTH TWICE DAILY WITH MEALS 180 tablet 0   methocarbamol (ROBAXIN) 500 MG tablet Take 1 tablet (500 mg total) by mouth every 8 (eight) hours as needed. 40 tablet 0   mupirocin ointment (BACTROBAN) 2 % Apply 1 application topically 2 (two) times daily. 22 g 2   ondansetron (ZOFRAN) 4 MG tablet Take 1 tablet  (4 mg total) by mouth every 8 (eight) hours as needed for nausea or vomiting. 40 tablet 0   oxyCODONE-acetaminophen (PERCOCET) 5-325 MG tablet Take 1-2 tablets by mouth every 6 (six) hours as needed. 40 tablet 0   predniSONE (DELTASONE) 10 MG tablet Take 3 tablets (30 mg total) by mouth daily with breakfast. 90 tablet 12   pyridostigmine (MESTINON) 60 MG tablet Take 1 tablet (60 mg total) by mouth 3 (three) times daily. 270 tablet 3   cephALEXin (KEFLEX) 500 MG capsule Take 1 capsule (500 mg total) by mouth 4 (four) times daily. 40 capsule 0   No facility-administered medications prior to visit.    PAST MEDICAL HISTORY: Past Medical History:  Diagnosis Date   Arthritis    Diabetes mellitus without complication (HCC)    Hypercholesterolemia    Hypertension    Myasthenia gravis (HCC)    Rheumatoid arthritis (HCC)     PAST SURGICAL HISTORY: Past Surgical History:  Procedure Laterality Date   CATARACT EXTRACTION, BILATERAL  2020   KNEE SURGERY Right    fractured patella > 20 yrs ago   PENILE PROSTHESIS IMPLANT  2015   TOTAL HIP ARTHROPLASTY Right 01/29/2021   Procedure: RIGHT TOTAL HIP ARTHROPLASTY ANTERIOR APPROACH;  Surgeon: Tarry Kos, MD;  Location:  [facility] OR;  Service: Orthopedics;  Laterality: Right;    FAMILY HISTORY: Family History  Problem Relation Age of Onset   Diabetes Mother    Heart disease Mother    Hyperlipidemia Mother    Heart attack Mother    Heart murmur Mother    Hyperlipidemia Father    Kidney failure Father    Drug abuse Maternal Grandmother    Heart disease Maternal Grandmother    Hypertension Maternal Grandmother    Hyperlipidemia Maternal Grandmother    Diabetes Maternal Grandmother     SOCIAL HISTORY:  Social History   Socioeconomic History   Marital status: Single    Spouse name: Not on file   Number of children: 1   Years of education: 18   Highest education level: Not on file  Occupational History    Comment: [employer]  Tobacco Use    Smoking status: Some Days    Packs/day: 1.00    Pack years: 0.00    Types: Cigars, Cigarettes   Smokeless tobacco: Never  Vaping Use   Vaping Use: Never used  Substance and Sexual Activity   Alcohol use: Yes    Comment: Rarely   Drug use: No   Sexual activity: Not on file  Other Topics Concern   Not on file  Social History Narrative   Drinks 2-3 caffeine drinks a day    Left handed   One story    Social Determinants of Health   Financial Resource Strain: Not on file  Food Insecurity: Not on file  Transportation Needs: Not on file  Physical Activity: Not on file  Stress: Not on file  Social Connections: Not on file  Intimate Partner Violence: Not on file     PHYSICAL EXAM  GENERAL EXAM/CONSTITUTIONAL: Vitals:  Vitals:   05/07/21 1539  BP: (!) 135/55  Pulse: 75  Weight: 199 lb 12.8 oz (90.6 kg)  Height: 5' 9.5" (1.765 m)    Wt Readings from Last 3 Encounters:  05/07/21 199 lb 12.8 oz (90.6 kg)  04/24/21 206 lb (93.4 kg)  02/28/21 206 lb 9.6 oz (93.7 kg)   Body mass index is 29.08 kg/m. No results found. Patient is in no distress; well developed, nourished and groomed; neck is supple  NEUROLOGIC: MENTAL STATUS:  No flowsheet data found. awake, alert, oriented to person, place and time recent and remote memory intact normal attention and concentration language fluent, comprehension intact, naming intact,  fund of knowledge appropriate  CRANIAL NERVE:  2nd, 3rd, 4th, 6th - pupils equal and reactive to light, visual fields full to confrontation, extraocular muscles --> NO DOUBLE VISION  5th - facial sensation symmetric 7th - facial strength symmetric 8th - hearing intact 9th - palate elevates symmetrically, uvula midline 11th - shoulder shrug symmetric 12th - tongue protrusion midline NO DYSARTHRIA  MOTOR:  BUE 4 RLE 4; LLE 5; DISTAL 5  COORDINATION:  fine finger movements normal  GAIT / STATION SHORT STEPS; CAUTIOUS GAIT; USING  CANE     DIAGNOSTIC DATA (LABS, IMAGING, TESTING) - I reviewed patient records, labs, notes, testing and imaging myself where available.  Lab Results  Component Value Date   WBC 15.0 (H) 01/31/2021   HGB 7.9 (L) 01/31/2021   HCT 23.1 (L) 01/31/2021   MCV 94.7 01/31/2021   PLT 208 01/31/2021      Component Value Date/Time   NA 138 01/30/2021 0447   NA 146 (H) 01/15/2021 0923   K 4.0 01/30/2021 0447   CL 105  01/30/2021 0447   CO2 22 01/30/2021 0447   GLUCOSE 134 (H) 01/30/2021 0447   BUN 29 (H) 01/30/2021 0447   BUN 19 01/15/2021 0923   CREATININE 1.87 (H) 01/30/2021 0447   CREATININE 1.39 (H) 06/29/2016 1626   CALCIUM 8.5 (L) 01/30/2021 0447   PROT 6.6 01/25/2021 1300   PROT 6.4 01/15/2021 0923   ALBUMIN 3.7 01/25/2021 1300   ALBUMIN 4.3 01/15/2021 0923   AST 18 01/25/2021 1300   ALT 14 01/25/2021 1300   ALKPHOS 46 01/25/2021 1300   BILITOT 0.7 01/25/2021 1300   BILITOT 0.2 01/15/2021 0923   GFRNONAA 38 (L) 01/30/2021 0447   GFRNONAA 53 (L) 06/29/2016 1626   GFRAA 50 (L) 01/15/2021 0923   GFRAA 61 06/29/2016 1626   Lab Results  Component Value Date   CHOL 158 01/15/2021   HDL 70 01/15/2021   LDLCALC 70 01/15/2021   TRIG 103 01/15/2021   CHOLHDL 2.3 01/15/2021   Hgb A1c MFr Bld  Date Value Ref Range Status  01/15/2021 7.1 (H) 4.8 - 5.6 % Final    Comment:             Prediabetes: 5.7 - 6.4          Diabetes: >6.4          Glycemic control for adults with diabetes: <7.0   10/13/2020 8.1 (H) 4.8 - 5.6 % Final    Comment:             Prediabetes: 5.7 - 6.4          Diabetes: >6.4          Glycemic control for adults with diabetes: <7.0   07/19/2020 7.2 (H) 4.8 - 5.6 % Final    Comment:             Prediabetes: 5.7 - 6.4          Diabetes: >6.4          Glycemic control for adults with diabetes: <7.0     No results found for: VITAMINB12 Lab Results  Component Value Date   TSH 3.650 10/24/2018    07/04/16 CT head [I reviewed images myself and  agree with interpretation. -VRP]  - Mild microvascular disease without acute intracranial process.  09/04/16 CT chest  1. No mediastinal mass or adenopathy. 2. No infiltrate or pulmonary edema. 3. Degenerative changes thoracic spine. 4. Atherosclerotic calcifications of thoracic aorta. 5. Mild fatty infiltration of the liver.  12/31/16 LABS TPMT Activity: Units/mL RBC 31.8   Comment: Reference Range:  Normal: 15.1 - 26.4  Heterozygous for low TPMT variant: 6.3 - 15.0  Homozygous for low TPMT variant: <6.3        ASSESSMENT AND PLAN  70 y.o. year old male here with new onset diagnosis of myasthenia gravis with ocular and generalized features. Was having side effects from mestinon, but also some improvement in ocular symptoms. IVIG has helped generalized symptoms in the past.     Dx: (ocular + generalized) myasthenia gravis  No diagnosis found.    PLAN:  MYASTHENIA GRAVIS (stable) - continue pyridostigmine 60mg  3x per day - REDUCE prednisone to 20mg  daily; continue close BP and DM monitoring and control - could consider cellcept or vyvgart for long term steroid sparing treatment - would not restart imuran at this time (due to infx within 1 month of starting) - caution with gait and balance; use cane - request second opinion with [facility]  FATIGUE / [facility]  MOTIVATION (suspect secondary depression) - consider SSRI  JOINT PAIN (positive CCP) - per rheumatology; off methotrexate  DIABETES / HYPERTENSION - continue BP and DM control  SLEEP APNEA - continue CPAP usage  Meds ordered this encounter  Medications   predniSONE (DELTASONE) 10 MG tablet    Sig: Take 2 tablets (20 mg total) by mouth daily with breakfast.    Dispense:  90 tablet    Refill:  12   Orders Placed This Encounter  Procedures   Ambulatory referral to Neurology   Return in about 3 months (around 08/07/2021).   Suanne Marker, MD 05/07/2021, 4:29 PM Certified in Neurology,  Neurophysiology and Neuroimaging  [facility] [address] [place], [place] [address] [phone]

## 2021-05-07 NOTE — Patient Instructions (Signed)
  MYASTHENIA GRAVIS (stable) - continue pyridostigmine 60mg  3x per day - REDUCE prednisone to 20mg  daily; continue close BP and DM monitoring and control

## 2021-05-08 ENCOUNTER — Telehealth: Payer: Self-pay

## 2021-05-08 NOTE — Telephone Encounter (Signed)
Referral sent to [facility]. P: [phone]. F:  [phone]. 

## 2021-05-21 ENCOUNTER — Telehealth: Payer: Self-pay | Admitting: *Deleted

## 2021-05-21 NOTE — Telephone Encounter (Signed)
Ortho bundle 90 day call and survey completed. 

## 2021-05-30 ENCOUNTER — Ambulatory Visit: Payer: Medicare PPO | Admitting: Internal Medicine

## 2021-05-30 ENCOUNTER — Other Ambulatory Visit: Payer: Self-pay

## 2021-05-30 ENCOUNTER — Encounter: Payer: Self-pay | Admitting: Internal Medicine

## 2021-05-30 VITALS — BP 135/68 | HR 90 | Ht 70.0 in | Wt 199.0 lb

## 2021-05-30 DIAGNOSIS — G7000 Myasthenia gravis without (acute) exacerbation: Secondary | ICD-10-CM

## 2021-05-30 DIAGNOSIS — M069 Rheumatoid arthritis, unspecified: Secondary | ICD-10-CM

## 2021-05-30 DIAGNOSIS — M4810 Ankylosing hyperostosis [Forestier], site unspecified: Secondary | ICD-10-CM | POA: Diagnosis not present

## 2021-05-30 NOTE — Progress Notes (Signed)
Office Visit Note  Patient: Jonathan Mcgee             Date of Birth: 04-Aug-1951           MRN: [id number]             PCP: Shade Flood, MD Referring: Shade Flood, MD Visit Date: 05/30/2021   Subjective:  Follow-up (Patient complains of continued bilateral shoulder discomfort and weakness. Patient was evaluated by Dr. Roda Shutters. )   History of Present Illness: Jonathan Mcgee is a 70 y.o. male here for follow up for seropositive rheumatoid arthritis his prednisone has been tapered down to 20 mg daily since last visit.  His worst joint problems are in the bilateral shoulders with pain and stiffness and difficulty in overhead range of motion.  He saw Dr. Deno Etienne for this was suspected as rotator cuff arthropathy and discussed corticosteroid injections with Dr. Prince Rome but deferred at this time.  He has increased significant edema in his bilateral lower extremities but no localized joint swelling or swelling outside of this area.  Previous HPI: 02/28/21 Jonathan Mcgee is a 70 y.o. male here for follow up for rheumatoid arthritis currently on prednisone 30 mg daily for myasthenia gravis indication. Since last visit he underwent planned right hip replacement surgery and is recovering from this and doing home therapy exercises. He has some swelling around this area and does not have his regular level of strength back yet but progressing. He also has bilateral shoulder soreness today. He has not noticed significant joint swelling.  11/29/20 Jonathan Mcgee is a 70 y.o. male with a history of myasthenia gravis, generalized osteoarthritis, and DISH here for evaluation of RA. Previously seen by Dr. Nickola Major since.a few months ago for these problems most recently in December of last year. His RA diagnosis starts around hospitalization in 2020 with severe lower extremity and shoulder pain and swelling evaluated by Dr. Nydia Bouton with [facility]. His most problematic joint pain is the right hip and has had weakness  in this leg with gait instability requiring him to use a cane. He was later seen in outpatient clinic locally and started on methotrexate although not clear if he was having active joint inflammation or more OA related pain. His prednisone was increased to 60mg  daily and received course of IVIG treatment in November and December and methotrexate was discontinued since he did not have active inflammatory disease with rheumatology visit in December. Currently he is on 40mg  prednisone slightly tapering his dose. He is having significant swelling in his legs.   Review of Systems  Constitutional:  Positive for fatigue.  HENT:  Negative for mouth sores, mouth dryness and nose dryness.   Eyes:  Positive for pain and visual disturbance. Negative for itching and dryness.  Respiratory:  Positive for shortness of breath. Negative for cough, hemoptysis and difficulty breathing.   Cardiovascular:  Positive for swelling in legs/feet. Negative for chest pain and palpitations.  Gastrointestinal:  Positive for constipation and diarrhea. Negative for abdominal pain and blood in stool.  Endocrine: Negative for increased urination.  Genitourinary:  Negative for painful urination.  Musculoskeletal:  Positive for joint pain, joint pain, muscle weakness and morning stiffness. Negative for joint swelling, myalgias, muscle tenderness and myalgias.  Skin:  Negative for color change, rash and redness.  Allergic/Immunologic: Negative for susceptible to infections.  Neurological:  Negative for dizziness, numbness, headaches, memory loss and weakness.  Hematological:  Negative for swollen glands.  Psychiatric/Behavioral:  Positive  for sleep disturbance. Negative for confusion.    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/29/2021   DISH (diffuse idiopathic skeletal hyperostosis) 11/29/2020   Primary  osteoarthritis of right hip 11/14/2020   Rheumatoid arthritis (HCC) 09/23/2019   Morbid obesity (HCC) 07/19/2019   UTI (urinary tract infection) 02/20/2019   Suspected COVID-19 virus infection 02/20/2019   Type 2 diabetes mellitus (HCC) 02/20/2019   Sepsis (HCC) 02/19/2019   CKD (chronic kidney disease) 04/12/2017   Essential hypertension 01/13/2017   Hyperlipidemia 01/13/2017   Myasthenia gravis (HCC) 08/28/2016    Past Medical History:  Diagnosis Date   Arthritis    Diabetes mellitus without complication (HCC)    Hypercholesterolemia    Hypertension    Myasthenia gravis (HCC)    Rheumatoid arthritis (HCC)     Family History  Problem Relation Age of Onset   Diabetes Mother    Heart disease Mother    Hyperlipidemia Mother    Heart attack Mother    Heart murmur Mother    Hyperlipidemia Father    Kidney failure Father    Drug abuse Maternal Grandmother    Heart disease Maternal Grandmother    Hypertension Maternal Grandmother    Hyperlipidemia Maternal Grandmother    Diabetes Maternal Grandmother    Past Surgical History:  Procedure Laterality Date   CATARACT EXTRACTION, BILATERAL  2020   KNEE SURGERY Right    fractured patella > 20 yrs ago   PENILE PROSTHESIS IMPLANT  2015   TOTAL HIP ARTHROPLASTY Right 01/29/2021   Procedure: RIGHT TOTAL HIP ARTHROPLASTY ANTERIOR APPROACH;  Surgeon: Tarry Kos, MD;  Location: [facility] OR;  Service: Orthopedics;  Laterality: Right;   Social History   Social History Narrative   Drinks 2-3 caffeine drinks a day    Left handed   One story    Immunization History  Administered Date(s) Administered   Hepatitis B, adult 07/18/2016   PFIZER(Purple Top)SARS-COV-2 Vaccination 01/09/2020, 02/01/2020, 08/19/2020     Objective: Vital Signs: BP 135/68 (BP Location: Right Arm, Patient Position: Sitting, Cuff Size: Normal)   Pulse 90   Ht 5\' 10"  (1.778 m)   Wt 199 lb (90.3 kg)   BMI 28.55 kg/m    Physical Exam Skin:    General: Skin is  warm and dry.     Findings: No rash.     Comments:  2+ pitting edema feet and halfway up shins bilaterally  Neurological:     Mental Status: He is alert.  Psychiatric:        Mood and Affect: Mood normal.     Musculoskeletal Exam:  Right shoulder mlld tenderness, Left shoulder pain with resisted abduction, bilaterally painful neers and hawkins Elbows full ROM no tenderness or swelling Wrists full ROM no tenderness or swelling Fingers full ROM no tenderness or swelling Knees full ROM no tenderness or swelling Extensive pitting edema overlying ankles and feet    Investigation: No additional findings.  Imaging: No results found.  Recent Labs: Lab Results  Component Value Date   WBC 15.0 (H) 01/31/2021   HGB 7.9 (L) 01/31/2021   PLT 208 01/31/2021   NA 138 01/30/2021   K 4.0 01/30/2021   CL 105 01/30/2021   CO2 22 01/30/2021   GLUCOSE 134 (H) 01/30/2021   BUN 29 (H) 01/30/2021   CREATININE 1.87 (H) 01/30/2021   BILITOT 0.7  01/25/2021   ALKPHOS 46 01/25/2021   AST 18 01/25/2021   ALT 14 01/25/2021   PROT 6.6 01/25/2021   ALBUMIN 3.7 01/25/2021   CALCIUM 8.5 (L) 01/30/2021   GFRAA 50 (L) 01/15/2021    Speciality Comments: No specialty comments available.  Procedures:  No procedures performed Allergies: Lisinopril   Assessment / Plan:     Visit Diagnoses: Rheumatoid arthritis, involving unspecified site, unspecified whether rheumatoid factor present (HCC) DISH (diffuse idiopathic skeletal hyperostosis)  History of chronic joint pains and positive RA serology but no focal joint inflammation present again on exam today of course this is on a high dose of daily prednisone.  He remains on significant immunosuppression for myasthenia gravis I would not recommend any additional DMARD addition or titration for joint symptoms at this time.  Discussed his shoulder symptoms he is not interested in steroid injection at this time because he would like to wait and see if they  get worse to have an injection at that time otherwise do exercises conservatively.  Myasthenia gravis (HCC)  He is currently on treatment with prednisone 20 mg and Mestinon 60 mg 3 times daily.  Not been on any more IVIG since end of last year.  Has ongoing neurology follow-up for medication management.  Orders: No orders of the defined types were placed in this encounter.  No orders of the defined types were placed in this encounter.    Follow-Up Instructions: Return if symptoms worsen or fail to improve.   Fuller Plan, MD  Note - This record has been created using [employer].  Chart creation errors have been sought, but may not always  have been located. Such creation errors do not reflect on  the standard of medical care.

## 2021-06-26 LAB — HM DIABETES EYE EXAM

## 2021-07-03 ENCOUNTER — Encounter: Payer: Self-pay | Admitting: Orthopaedic Surgery

## 2021-07-03 ENCOUNTER — Ambulatory Visit: Payer: Medicare PPO | Admitting: Orthopaedic Surgery

## 2021-07-03 ENCOUNTER — Ambulatory Visit (INDEPENDENT_AMBULATORY_CARE_PROVIDER_SITE_OTHER): Payer: Medicare PPO

## 2021-07-03 ENCOUNTER — Other Ambulatory Visit: Payer: Self-pay

## 2021-07-03 DIAGNOSIS — Z96641 Presence of right artificial hip joint: Secondary | ICD-10-CM

## 2021-07-03 NOTE — Progress Notes (Signed)
Post-Op Visit Note   Patient: Jonathan Mcgee           Date of Birth: 27-Sep-1951           MRN: [id number] Visit Date: 07/03/2021 PCP: Shade Flood, MD   Assessment & Plan:  Chief Complaint:  Chief Complaint  Patient presents with   Right Hip - Pain   Visit Diagnoses:  1. S/P total right hip arthroplasty     Plan: Patient is approximately 6 months status post right total hip replacement.  Has no real complaints other than some weird sensation and burning sensation around the thigh.  He is ambulating without a cane today.  Right hip scar is fully healed.  Good gait and ambulation.  Normal range of motion without pain.  X-rays are unremarkable.  Dental prophylaxis reinforced.  Patient has returned back to baseline activity without any problems.  We will see him back in 59months for 1 year visit with standing AP pelvis x-rays.  Follow-Up Instructions: Return in about 6 months (around 01/03/2022).   Orders:  Orders Placed This Encounter  Procedures   XR HIP UNILAT W OR W/O PELVIS 2-3 VIEWS RIGHT   No orders of the defined types were placed in this encounter.   Imaging: XR HIP UNILAT W OR W/O PELVIS 2-3 VIEWS RIGHT  Result Date: 07/03/2021 Stable right total hip replacement without complication.  Advanced DJD of the left hip joint   PMFS History: Patient Active Problem List   Diagnosis Date Noted   S/P total right hip arthroplasty 03/13/2021   Status post total replacement of right hip 01/29/2021   DISH (diffuse idiopathic skeletal hyperostosis) 11/29/2020   Primary osteoarthritis of right hip 11/14/2020   Rheumatoid arthritis (HCC) 09/23/2019   Morbid obesity (HCC) 07/19/2019   UTI (urinary tract infection) 02/20/2019   Suspected COVID-19 virus infection 02/20/2019   Type 2 diabetes mellitus (HCC) 02/20/2019   Sepsis (HCC) 02/19/2019   CKD (chronic kidney disease) 04/12/2017   Essential hypertension 01/13/2017   Hyperlipidemia 01/13/2017   Myasthenia  gravis (HCC) 08/28/2016   Past Medical History:  Diagnosis Date   Arthritis    Diabetes mellitus without complication (HCC)    Hypercholesterolemia    Hypertension    Myasthenia gravis (HCC)    Rheumatoid arthritis (HCC)     Family History  Problem Relation Age of Onset   Diabetes Mother    Heart disease Mother    Hyperlipidemia Mother    Heart attack Mother    Heart murmur Mother    Hyperlipidemia Father    Kidney failure Father    Drug abuse Maternal Grandmother    Heart disease Maternal Grandmother    Hypertension Maternal Grandmother    Hyperlipidemia Maternal Grandmother    Diabetes Maternal Grandmother     Past Surgical History:  Procedure Laterality Date   CATARACT EXTRACTION, BILATERAL  2020   KNEE SURGERY Right    fractured patella > 20 yrs ago   PENILE PROSTHESIS IMPLANT  2015   TOTAL HIP ARTHROPLASTY Right 01/29/2021   Procedure: RIGHT TOTAL HIP ARTHROPLASTY ANTERIOR APPROACH;  Surgeon: Tarry Kos, MD;  Location: [facility] OR;  Service: Orthopedics;  Laterality: Right;   Social History   Occupational History    Comment: [employer]  Tobacco Use   Smoking status: Some Days    Types: Cigars   Smokeless tobacco: Never  Vaping Use   Vaping Use: Never used  Substance and Sexual Activity   Alcohol use: Yes  Comment: Rarely   Drug use: No   Sexual activity: Not on file

## 2021-08-13 ENCOUNTER — Ambulatory Visit: Payer: Medicare PPO | Admitting: Diagnostic Neuroimaging

## 2021-08-13 ENCOUNTER — Encounter: Payer: Self-pay | Admitting: Diagnostic Neuroimaging

## 2021-08-13 ENCOUNTER — Other Ambulatory Visit: Payer: Self-pay

## 2021-08-13 VITALS — BP 147/50 | HR 58 | Ht 69.5 in | Wt 198.0 lb

## 2021-08-13 DIAGNOSIS — R269 Unspecified abnormalities of gait and mobility: Secondary | ICD-10-CM | POA: Diagnosis not present

## 2021-08-13 DIAGNOSIS — M2550 Pain in unspecified joint: Secondary | ICD-10-CM | POA: Diagnosis not present

## 2021-08-13 DIAGNOSIS — G7000 Myasthenia gravis without (acute) exacerbation: Secondary | ICD-10-CM

## 2021-08-13 MED ORDER — PYRIDOSTIGMINE BROMIDE 60 MG PO TABS: 60.0000 mg | ORAL_TABLET | Freq: Three times a day (TID) | ORAL | 3 refills | Status: DC

## 2021-08-13 MED ORDER — PREDNISONE 10 MG PO TABS: 20.0000 mg | ORAL_TABLET | Freq: Every day | ORAL | 12 refills | Status: DC

## 2021-08-13 NOTE — Progress Notes (Signed)
[facility]  PATIENT: Jonathan Mcgee DOB: 07/22/1951  REFERRING CLINICIAN: Deliah Boston, PA-c HISTORY FROM: patient  REASON FOR VISIT: follow up    HISTORICAL  CHIEF COMPLAINT:  Chief Complaint  Patient presents with   Follow-up    Room 6. Pt alone. New PCP recently changed a lot of his medications.     HISTORY OF PRESENT ILLNESS:   UPDATE (08/13/21, VRP): Since last visit, doing about the same. Now on prednisone 20mg  daily. MG is stable.  UPDATE (05/07/21, VRP): Since last visit, doing about the same. Now on prednisone 30mg  daily. Muscle cramps continue intermittently. No ptosis or double vision. Fatigue continues. Decr motivation and interest. Had right hip surgery and doing well in recovery. Bilateral foot swelling stable.  UPDATE (12/20/20, VRP): Since last visit, had IVIG in Sept 2021. Continues on mestinon and prednisone. Continues with right hip pain, mobility issues. Planning for right hip surgery in end of Feb 2022.   UPDATE (07/24/20, VRP): Since last visit, doing poorly; more mobility and weakness issues. Needs a cane now. Symptoms are progressive. Severity is increased. No alleviating or aggravating factors. Tolerating mestinon and prednisone.    UPDATE (01/18/20, VRP): Since last visit, more swelling in legs; rheumatology requesting to see if prednisone dosing can be reduced. Symptoms are progressive.  UPDATE (07/19/19, VRP): Since last visit, doing poorly (more fatigue, muscle weakness in arms). Had phone call with [facility] rheumatology but no other follow up. Prior joint pain and leg issues are improved. Double vision and swallowing issues are resolved. Tolerating pyridostigmine. Prednisone is completed.  UPDATE (02/24/19, VRP): Since last visit, had started on imuran 1 month ago. Then had fevers, pyuria, and weakness. Also had significant knee pain, and admitted to hospital (March 2020). Dx'd with rheumatoid arthritis and UTI; imuran was stopped.  No  focal or localized signs to suggest myasthenia gravis sedation or crisis.  Prednisone 10 mg was started and patient was referred to rheumatology outpatient.  Of note patient was tested for flu and nCoV both which were negative.  Patient now back home.  He is doing well.  No fevers.  He is completing his antibiotic course.  He is on prednisone 10 mg daily.  Pain is subsided. MG symptoms are stable since last visit. Breathing, chewing, swallowing stable.   UPDATE (01/18/19, VRP): Since last visit, doing slightly worse. Mild weakness in left leg. Slightly mild generalized fatigue and weakness. Worse with exertion. Symptoms are worse than 2019. No speech, swallow or breathing issues. Tolerating mestinon.   UPDATE (04/15/18, VRP): Since last visit, doing about the same with MG. Tolerating mestinon. No alleviating or aggravating factors. Now planning to move to [place] for work.  UPDATE 07/16/17: Since last visit, overall stable. Mild generalized fatigue continues. Overall stable. Notices some more muscle strain.   UPDATE 02/12/17: Since last visit, BP is better (meds have been adjusted). Now has CPAP machine (about to start). Sugars are better now (averaging ~150's). Intermittent double vision continues. Mild generalized fatigue, but no focal weakness.   UPDATE 12/31/16: Since last visit, IVIG completed, and general energy little better, but muscle cramps worse. BP running high. He has not checked sugars lately. PSG showed mod-severe OSA, and CPAP titration study is pending. Tolerating mestinon 60mg  BID.   UPDATE 10/09/16: Since last visit, double vision is slightly better on mestinon 60mg  TID. However having more muscle cramps, excess saliva production. Fatigable weakness is persistent.   PRIOR HPI (08/28/16): 70 year old ambidextrous male with hypertension, diabetes, hypercholesterolemia  here for evaluation of myasthenia gravis. For past 1-2 years patient has had mild intermittent right-sided ptosis. In May  2017 he noticed onset of double vision, blurred vision, wavy vision. This is progressively worsened. For past 1 month he has had increasing fatigue with walking. Is having difficulty with upper and lower extremity strength. He denies any speech, swallowing, breathing difficulty. His balance has been slightly off. Patient went to PCP, ophthalmology, had [facility] antibody testing which confirmed diagnosis of myasthenia gravis.   REVIEW OF SYSTEMS: Full 14 system review of systems performed and negative with exception of: as per HPI.   ALLERGIES: Allergies  Allergen Reactions   Lisinopril Swelling    Facial swelling    HOME MEDICATIONS: Outpatient Medications Prior to Visit  Medication Sig Dispense Refill   amLODipine (NORVASC) 5 MG tablet TAKE 1 & 1/2 (ONE & ONE-HALF) TABLETS BY MOUTH ONCE DAILY 135 tablet 0   atorvastatin (LIPITOR) 20 MG tablet Take 1 tablet by mouth daily.     finasteride (PROSCAR) 5 MG tablet Take 1 tablet by mouth daily.     metoprolol succinate (TOPROL-XL) 25 MG 24 hr tablet Take 1 tablet by mouth daily.     Semaglutide,0.25 or 0.5MG /DOS, 2 MG/1.5ML SOPN Inject into the skin.     torsemide (DEMADEX) 10 MG tablet Take 1 tablet by mouth daily.     valsartan (DIOVAN) 160 MG tablet Take 1 tablet by mouth daily.     atorvastatin (LIPITOR) 20 MG tablet Take 1 tablet (20 mg total) by mouth daily. 90 tablet 3   predniSONE (DELTASONE) 10 MG tablet Take 2 tablets (20 mg total) by mouth daily with breakfast. 90 tablet 12   pyridostigmine (MESTINON) 60 MG tablet Take 1 tablet (60 mg total) by mouth 3 (three) times daily. 270 tablet 3   aspirin EC 81 MG tablet Take 1 tablet (81 mg total) by mouth 2 (two) times daily. To be taken after surgery (Patient not taking: No sig reported) 84 tablet 0   docusate sodium (COLACE) 100 MG capsule Take 1 capsule (100 mg total) by mouth daily as needed. (Patient not taking: No sig reported) 30 capsule 2   furosemide (LASIX) 20 MG tablet Take 1 tablet  (20 mg total) by mouth daily. (Patient not taking: Reported on 08/13/2021) 60 tablet 0   glipiZIDE (GLUCOTROL) 5 MG tablet Take 0.5 tablets (2.5 mg total) by mouth daily before breakfast. (Patient not taking: Reported on 08/13/2021) 90 tablet 1   hydrochlorothiazide (HYDRODIURIL) 25 MG tablet Take 1 tablet (25 mg total) by mouth daily. (Patient not taking: Reported on 08/13/2021) 90 tablet 1   losartan (COZAAR) 100 MG tablet Take 1 tablet (100 mg total) by mouth daily. (Patient not taking: Reported on 08/13/2021) 90 tablet 1   metFORMIN (GLUCOPHAGE) 1000 MG tablet TAKE 1 TABLET BY MOUTH TWICE DAILY WITH MEALS (Patient not taking: Reported on 08/13/2021) 180 tablet 0   methocarbamol (ROBAXIN) 500 MG tablet Take 1 tablet (500 mg total) by mouth every 8 (eight) hours as needed. (Patient not taking: No sig reported) 40 tablet 0   mupirocin ointment (BACTROBAN) 2 % Apply 1 application topically 2 (two) times daily. (Patient not taking: No sig reported) 22 g 2   ondansetron (ZOFRAN) 4 MG tablet Take 1 tablet (4 mg total) by mouth every 8 (eight) hours as needed for nausea or vomiting. (Patient not taking: No sig reported) 40 tablet 0   oxyCODONE-acetaminophen (PERCOCET) 5-325 MG tablet Take 1-2 tablets by mouth every  6 (six) hours as needed. (Patient not taking: No sig reported) 40 tablet 0   No facility-administered medications prior to visit.    PAST MEDICAL HISTORY: Past Medical History:  Diagnosis Date   Arthritis    Diabetes mellitus without complication (HCC)    Hypercholesterolemia    Hypertension    Myasthenia gravis (HCC)    Rheumatoid arthritis (HCC)     PAST SURGICAL HISTORY: Past Surgical History:  Procedure Laterality Date   CATARACT EXTRACTION, BILATERAL  2020   KNEE SURGERY Right    fractured patella > 20 yrs ago   PENILE PROSTHESIS IMPLANT  2015   TOTAL HIP ARTHROPLASTY Right 01/29/2021   Procedure: RIGHT TOTAL HIP ARTHROPLASTY ANTERIOR APPROACH;  Surgeon: Tarry Kos, MD;   Location: [facility] OR;  Service: Orthopedics;  Laterality: Right;    FAMILY HISTORY: Family History  Problem Relation Age of Onset   Diabetes Mother    Heart disease Mother    Hyperlipidemia Mother    Heart attack Mother    Heart murmur Mother    Hyperlipidemia Father    Kidney failure Father    Drug abuse Maternal Grandmother    Heart disease Maternal Grandmother    Hypertension Maternal Grandmother    Hyperlipidemia Maternal Grandmother    Diabetes Maternal Grandmother     SOCIAL HISTORY:  Social History   Socioeconomic History   Marital status: Single    Spouse name: Not on file   Number of children: 1   Years of education: 18   Highest education level: Not on file  Occupational History    Comment: [employer]  Tobacco Use   Smoking status: Some Days    Types: Cigars   Smokeless tobacco: Never  Vaping Use   Vaping Use: Never used  Substance and Sexual Activity   Alcohol use: Yes    Comment: Rarely   Drug use: No   Sexual activity: Not on file  Other Topics Concern   Not on file  Social History Narrative   Drinks 2-3 caffeine drinks a day    Left handed   One story    Social Determinants of Health   Financial Resource Strain: Not on file  Food Insecurity: Not on file  Transportation Needs: Not on file  Physical Activity: Not on file  Stress: Not on file  Social Connections: Not on file  Intimate Partner Violence: Not on file     PHYSICAL EXAM  GENERAL EXAM/CONSTITUTIONAL: Vitals:  Vitals:   08/13/21 1522  BP: (!) 147/50  Pulse: (!) 58  Weight: 198 lb (89.8 kg)  Height: 5' 9.5" (1.765 m)    Wt Readings from Last 10 Encounters:  08/13/21 198 lb (89.8 kg)  05/30/21 199 lb (90.3 kg)  05/07/21 199 lb 12.8 oz (90.6 kg)  04/24/21 206 lb (93.4 kg)  02/28/21 206 lb 9.6 oz (93.7 kg)  01/29/21 214 lb 15.2 oz (97.5 kg)  01/25/21 215 lb (97.5 kg)  12/29/20 208 lb (94.3 kg)  12/25/20 202 lb (91.6 kg)  12/20/20 208 lb (94.3 kg)   Body mass index is 28.82  kg/m. No results found. Patient is in no distress; well developed, nourished and groomed; neck is supple  NEUROLOGIC: MENTAL STATUS:  No flowsheet data found. awake, alert, oriented to person, place and time recent and remote memory intact normal attention and concentration language fluent, comprehension intact, naming intact,  fund of knowledge appropriate  CRANIAL NERVE:  2nd, 3rd, 4th, 6th - pupils equal and reactive to  light, visual fields full to confrontation, extraocular muscles --> NO DOUBLE VISION  5th - facial sensation symmetric 7th - facial strength symmetric 8th - hearing intact 9th - palate elevates symmetrically, uvula midline 11th - shoulder shrug symmetric 12th - tongue protrusion midline NO DYSARTHRIA  MOTOR:  BUE 4 RLE 4; LLE 5; DISTAL 5  COORDINATION:  fine finger movements normal  GAIT / STATION SHORT STEPS; CAUTIOUS GAIT     DIAGNOSTIC DATA (LABS, IMAGING, TESTING) - I reviewed patient records, labs, notes, testing and imaging myself where available.  Lab Results  Component Value Date   WBC 15.0 (H) 01/31/2021   HGB 7.9 (L) 01/31/2021   HCT 23.1 (L) 01/31/2021   MCV 94.7 01/31/2021   PLT 208 01/31/2021      Component Value Date/Time   NA 138 01/30/2021 0447   NA 146 (H) 01/15/2021 0923   K 4.0 01/30/2021 0447   CL 105 01/30/2021 0447   CO2 22 01/30/2021 0447   GLUCOSE 134 (H) 01/30/2021 0447   BUN 29 (H) 01/30/2021 0447   BUN 19 01/15/2021 0923   CREATININE 1.87 (H) 01/30/2021 0447   CREATININE 1.39 (H) 06/29/2016 1626   CALCIUM 8.5 (L) 01/30/2021 0447   PROT 6.6 01/25/2021 1300   PROT 6.4 01/15/2021 0923   ALBUMIN 3.7 01/25/2021 1300   ALBUMIN 4.3 01/15/2021 0923   AST 18 01/25/2021 1300   ALT 14 01/25/2021 1300   ALKPHOS 46 01/25/2021 1300   BILITOT 0.7 01/25/2021 1300   BILITOT 0.2 01/15/2021 0923   GFRNONAA 38 (L) 01/30/2021 0447   GFRNONAA 53 (L) 06/29/2016 1626   GFRAA 50 (L) 01/15/2021 0923   GFRAA 61 06/29/2016  1626   Lab Results  Component Value Date   CHOL 158 01/15/2021   HDL 70 01/15/2021   LDLCALC 70 01/15/2021   TRIG 103 01/15/2021   CHOLHDL 2.3 01/15/2021   Hgb A1c MFr Bld  Date Value Ref Range Status  01/15/2021 7.1 (H) 4.8 - 5.6 % Final    Comment:             Prediabetes: 5.7 - 6.4          Diabetes: >6.4          Glycemic control for adults with diabetes: <7.0   10/13/2020 8.1 (H) 4.8 - 5.6 % Final    Comment:             Prediabetes: 5.7 - 6.4          Diabetes: >6.4          Glycemic control for adults with diabetes: <7.0   07/19/2020 7.2 (H) 4.8 - 5.6 % Final    Comment:             Prediabetes: 5.7 - 6.4          Diabetes: >6.4          Glycemic control for adults with diabetes: <7.0     No results found for: VITAMINB12 Lab Results  Component Value Date   TSH 3.650 10/24/2018    07/04/16 CT head [I reviewed images myself and agree with interpretation. -VRP]  - Mild microvascular disease without acute intracranial process.  09/04/16 CT chest  1. No mediastinal mass or adenopathy. 2. No infiltrate or pulmonary edema. 3. Degenerative changes thoracic spine. 4. Atherosclerotic calcifications of thoracic aorta. 5. Mild fatty infiltration of the liver.  12/31/16 LABS TPMT Activity: Units/mL RBC 31.8   Comment: Reference Range:  Normal:  15.1 - 26.4  Heterozygous for low TPMT variant: 6.3 - 15.0  Homozygous for low TPMT variant: <6.3        ASSESSMENT AND PLAN  70 y.o. year old male here with new onset diagnosis of myasthenia gravis with ocular and generalized features. Was having side effects from mestinon, but also some improvement in ocular symptoms. IVIG has helped generalized symptoms in the past.     Dx: (ocular + generalized) myasthenia gravis  1. Myasthenia gravis (HCC)   2. Gait difficulty   3. Arthralgia, unspecified joint       PLAN:  MYASTHENIA GRAVIS (stable) - continue pyridostigmine 60mg  3x per day - continue prednisone 20mg   daily; continue close BP and DM monitoring and control - could consider cellcept or vyvgart for long term steroid sparing treatment - would not restart imuran at this time (due to infx within 1 month of starting) - caution with gait and balance; use cane - consider second opinion with [facility]  FATIGUE / DECR MOTIVATION (suspect secondary depression) - consider SSRI  JOINT PAIN (positive CCP) - per rheumatology; off methotrexate  DIABETES / HYPERTENSION - continue BP and DM control  SLEEP APNEA - continue CPAP usage  Meds ordered this encounter  Medications   predniSONE (DELTASONE) 10 MG tablet    Sig: Take 2 tablets (20 mg total) by mouth daily with breakfast.    Dispense:  90 tablet    Refill:  12   pyridostigmine (MESTINON) 60 MG tablet    Sig: Take 1 tablet (60 mg total) by mouth 3 (three) times daily.    Dispense:  270 tablet    Refill:  3   Return in about 9 months (around 05/13/2022).   , MD 08/13/2021, 4:07 PM Certified in Neurology, Neurophysiology and Neuroimaging  [facility] [address], [address] [place], [address] [place] [phone]

## 2022-03-23 NOTE — Progress Notes (Signed)
CARDIOLOGY CONSULT NOTE  ? ? ? ? ? ?Patient ID: ?Jonathan Mcgee ?MRN: [id number] ?DOB/AGE: 1951/03/07 71 y.o. ? ?Admit date: (Not on file) ?Referring Physician: Neva SeatGreene ?Primary Physician: Verlee RossettiStein, William J, PA-C ?Primary Cardiologist: New ?Reason for Consultation: Murmur  ? ?Active Problems: ?  * No active hospital problems. * ? ? ?HPI:  71 y.o. referred by Dr Neva SeatGreene for murmur on 02/15/20  She has a history of DM, HTN, HLD, CRF, Myasthenia, RA on methotrexate She has significant muscle weakness in arms and fatigue from myasthenia On 20 mg of prednisone and follows with Dr Marjory LiesPenumalli in neurology  ? ?Still smokes less than a ppd  ?White coat component to HTN ?LE edema chronic on demedex 10 mg daily  ?Contribution from steroids/norvasc possible   ? ?TTE 02/29/20 EF 60-65% mild AR trivial MR ?Myovue 02/29/20 normal no ischemia EF 59% ? ?Since I last saw her she had right THR with Dr Roda ShuttersXu ? ?He has lived in [place]. [place] in [place]. Does commercial contracting  ? ?Seems to have lots of questions about his non cardiac health ? ?ROS ?All other systems reviewed and negative except as noted above ? ?Past Medical History:  ?Diagnosis Date  ? Arthritis   ? Diabetes mellitus without complication (HCC)   ? Hypercholesterolemia   ? Hypertension   ? Myasthenia gravis (HCC)   ? Rheumatoid arthritis (HCC)   ?  ?Family History  ?Problem Relation Age of Onset  ? Diabetes Mother   ? Heart disease Mother   ? Hyperlipidemia Mother   ? Heart attack Mother   ? Heart murmur Mother   ? Hyperlipidemia Father   ? Kidney failure Father   ? Drug abuse Maternal Grandmother   ? Heart disease Maternal Grandmother   ? Hypertension Maternal Grandmother   ? Hyperlipidemia Maternal Grandmother   ? Diabetes Maternal Grandmother   ?  ?Social History  ? ?Socioeconomic History  ? Marital status: Single  ?  Spouse name: Not on file  ? Number of children: 1  ? Years of education: 5418  ? Highest education level: Not on file  ?Occupational History  ?  Comment: [employer]   ?Tobacco Use  ? Smoking status: Some Days  ?  Types: Cigars  ? Smokeless tobacco: Never  ?Vaping Use  ? Vaping Use: Never used  ?Substance and Sexual Activity  ? Alcohol use: Yes  ?  Comment: Rarely  ? Drug use: No  ? Sexual activity: Not on file  ?Other Topics Concern  ? Not on file  ?Social History Narrative  ? Drinks 2-3 caffeine drinks a day   ? Left handed  ? One story   ? ?Social Determinants of Health  ? ?Financial Resource Strain: Not on file  ?Food Insecurity: Not on file  ?Transportation Needs: Not on file  ?Physical Activity: Not on file  ?Stress: Not on file  ?Social Connections: Not on file  ?Intimate Partner Violence: Not on file  ?  ?Past Surgical History:  ?Procedure Laterality Date  ? CATARACT EXTRACTION, BILATERAL  2020  ? KNEE SURGERY Right   ? fractured patella > 20 yrs ago  ? PENILE PROSTHESIS IMPLANT  2015  ? TOTAL HIP ARTHROPLASTY Right 01/29/2021  ? Procedure: RIGHT TOTAL HIP ARTHROPLASTY ANTERIOR APPROACH;  Surgeon: Tarry KosXu, Naiping M, MD;  Location: [facility];  Service: Orthopedics;  Laterality: Right;  ?  ? ? ?Current Outpatient Medications:  ?  amLODipine (NORVASC) 5 MG tablet, TAKE 1 & 1/2 (ONE &  ONE-HALF) TABLETS BY MOUTH ONCE DAILY, Disp: 135 tablet, Rfl: 0 ?  atorvastatin (LIPITOR) 20 MG tablet, Take 1 tablet by mouth daily., Disp: , Rfl:  ?  finasteride (PROSCAR) 5 MG tablet, Take 1 tablet by mouth daily., Disp: , Rfl:  ?  metoprolol succinate (TOPROL-XL) 25 MG 24 hr tablet, Take 1 tablet by mouth daily., Disp: , Rfl:  ?  predniSONE (DELTASONE) 10 MG tablet, Take 2 tablets (20 mg total) by mouth daily with breakfast., Disp: 90 tablet, Rfl: 12 ?  pyridostigmine (MESTINON) 60 MG tablet, Take 1 tablet (60 mg total) by mouth 3 (three) times daily., Disp: 270 tablet, Rfl: 3 ?  Semaglutide,0.25 or 0.5MG /DOS, 2 MG/1.5ML SOPN, Inject into the skin., Disp: , Rfl:  ?  torsemide (DEMADEX) 10 MG tablet, Take 1 tablet by mouth daily., Disp: , Rfl:  ?  valsartan (DIOVAN) 160 MG tablet, Take 1 tablet by  mouth daily., Disp: , Rfl:  ? ? ? ?Physical Exam: ?Blood pressure (!) 150/62, pulse 62, height 5' 9.5" (1.765 m), weight 193 lb (87.5 kg), SpO2 98 %.   ? ?Affect appropriate ?Chronically ill male  ?HEENT: normal ?Neck supple with no adenopathy ?JVP normal no bruits no thyromegaly ?Lungs clear with no wheezing and good diaphragmatic motion ?Heart:  S1/S2 2/6 SEM  murmur, no rub, gallop or click ?PMI normal ?Abdomen: benighn, BS positve, no tenderness, no AAA ?no bruit.  No HSM or HJR ?Distal pulses intact with no bruits ?Plus 2 bilateral edema ?Neuro non-focal ?Skin warm and dry ?Diffuse UE weakness  ?Post THR  ? ?Labs: ?  ?Lab Results  ?Component Value Date  ? WBC 15.0 (H) 01/31/2021  ? HGB 7.9 (L) 01/31/2021  ? HCT 23.1 (L) 01/31/2021  ? MCV 94.7 01/31/2021  ? PLT 208 01/31/2021  ? No results for input(s): NA, K, CL, CO2, BUN, CREATININE, CALCIUM, PROT, BILITOT, ALKPHOS, ALT, AST, GLUCOSE in the last 168 hours. ? ?Invalid input(s): LABALBU ?Lab Results  ?Component Value Date  ? CKTOTAL 67 10/13/2020  ?  ?Lab Results  ?Component Value Date  ? CHOL 158 01/15/2021  ? CHOL 145 07/19/2020  ? CHOL 118 01/19/2020  ? ?Lab Results  ?Component Value Date  ? HDL 70 01/15/2021  ? HDL 52 07/19/2020  ? HDL 59 01/19/2020  ? ?Lab Results  ?Component Value Date  ? LDLCALC 70 01/15/2021  ? LDLCALC 70 07/19/2020  ? LDLCALC 47 01/19/2020  ? ?Lab Results  ?Component Value Date  ? TRIG 103 01/15/2021  ? TRIG 134 07/19/2020  ? TRIG 54 01/19/2020  ? ?Lab Results  ?Component Value Date  ? CHOLHDL 2.3 01/15/2021  ? CHOLHDL 2.8 07/19/2020  ? CHOLHDL 2.0 01/19/2020  ? ?No results found for: LDLDIRECT  ?  ?Radiology: ?No results found. ? ?EKG: SR rate 90 PVC otherwise normal  03/25/2022 SR rate 88 normal 03/25/2022 SR rate 62 normal  ? ? ? ?ASSESSMENT AND PLAN:  ? ?1. Murmur:  Benign update echo TTE 02/29/20 AV sclerosis/mild AR and trivial MR ? ?2. HTN:  Well controlled.  Continue current medications and low sodium Dash type diet.   ? ?3. DM:   Discussed low carb diet.  Target hemoglobin A1c is 6.5 or less.  Continue current medications. Seeing [facility] with [facility] ? Changing from Ozempic to [place]  ? ?4. Myesthenia:  F/u neurology on mestinon and low dose steroids ? ?5. RA:  Arthritis stable previous methotrexate  Has had IVIG and imuran in past as well  ? ?  6. CRF:  Cr 1.79  02/08/22 likely due to HTN/DM f/u primary  ? ?7. CAD:  Risk with poorly controlled DM, cigar smoking , HTN  lexiscan myovue normal 02/29/20  ? ?8. Peripheral edema:  chronic On demedex 10 mg daily Related to varicosities , prednisone and ? Norvasc Will d/c latter wear compression stockings discussed going to [facility] and have venous duplex  ? ?Hold Norvasc ?See [place] [facility] specialist ?F/U Dr Lou Miner DM ? ? ?F/U in a year  ? ?Signed: ?Charlton Haws ?03/25/2022, 8:21 AM ? ? ?

## 2022-03-25 ENCOUNTER — Encounter: Payer: Self-pay | Admitting: Cardiovascular Disease

## 2022-03-25 ENCOUNTER — Ambulatory Visit: Payer: Medicare PPO | Admitting: Cardiovascular Disease

## 2022-03-25 VITALS — BP 150/62 | HR 62 | Ht 69.5 in | Wt 193.0 lb

## 2022-03-25 DIAGNOSIS — R609 Edema, unspecified: Secondary | ICD-10-CM | POA: Diagnosis not present

## 2022-03-25 DIAGNOSIS — N1831 Chronic kidney disease, stage 3a: Secondary | ICD-10-CM

## 2022-03-25 DIAGNOSIS — I10 Essential (primary) hypertension: Secondary | ICD-10-CM

## 2022-03-25 DIAGNOSIS — R011 Cardiac murmur, unspecified: Secondary | ICD-10-CM

## 2022-03-25 DIAGNOSIS — G7000 Myasthenia gravis without (acute) exacerbation: Secondary | ICD-10-CM

## 2022-03-25 NOTE — Patient Instructions (Signed)

## 2022-04-18 ENCOUNTER — Telehealth: Payer: Self-pay | Admitting: *Deleted

## 2022-04-18 NOTE — Telephone Encounter (Signed)
Ortho bundle 1 year call completed. ?

## 2022-04-26 ENCOUNTER — Ambulatory Visit: Payer: Medicare PPO | Admitting: Cardiovascular Disease

## 2022-05-13 ENCOUNTER — Ambulatory Visit: Payer: Medicare PPO | Admitting: Diagnostic Neuroimaging

## 2022-07-04 NOTE — Progress Notes (Signed)
Follow-up Visit   Date: 07/05/22   Dayvian Blixt MRN: [id number] DOB: 02/19/1951   Interim History: Ayvion Kavanagh is a 71 y.o. left-handed [place] male with diabetes mellitus, hypertension, hyperlipidemia, and RA returning to the clinic for follow-up of myasthenia gravis.  The patient was accompanied to the clinic by self.  History of present illness: Patient was diagnosed with seropositive myasthenia gravis in 2017 after presenting with double vision, mild droopiness of the eyes, and mild slurred speech.  He has been followed by Dr. Marjory Lies. He was started on mestinon 60mg  three times daily and has been on prednisone 20mg /d.  He has never been hospitalized with MG crisis. He was hospitalized due to infection after starting azathioprine in 2020.  Due to leg weakness, he was managed with induction dose of IVIG in 2020 and again in the fall of 2021.  It is not clear whether he has a significant benefit after IVIG.  In the fall of 2021, he began having leg weakness, imbalance, difficulty with walking.  For this reason, he was given a course of IVIG and prednisone was increased to 60mg /d.   Of note, patient also has rheumatoid arthritis and takes methotexate for this prescribed by rheumatology (Dr. 2022).    UPDATE 07/05/2022:  He was last seen in 2021 for second opinion of MG and now transferring care.  He has been on prednisone 20mg  and mestinon 60mg  twice daily for the past year.  He denies double vision, droopiness of the eyelids, difficulty swallowing/talking, or limb weakness.  He also takes mestinon 60mg  BID but reports cramps and muscle spasms with this.   He is with another rheumatologist and no longer on methotrexate.    Medications:  Current Outpatient Medications on File Prior to Visit  Medication Sig Dispense Refill   amLODipine (NORVASC) 5 MG tablet TAKE 1 & 1/2 (ONE & ONE-HALF) TABLETS BY MOUTH ONCE DAILY 135 tablet 0   atorvastatin (LIPITOR) 20 MG tablet Take 1  tablet by mouth daily.     finasteride (PROSCAR) 5 MG tablet Take 1 tablet by mouth daily.     metoprolol succinate (TOPROL-XL) 25 MG 24 hr tablet Take 1 tablet by mouth daily.     predniSONE (DELTASONE) 10 MG tablet Take 2 tablets (20 mg total) by mouth daily with breakfast. 90 tablet 12   pyridostigmine (MESTINON) 60 MG tablet Take 1 tablet (60 mg total) by mouth 3 (three) times daily. 270 tablet 3   Semaglutide,0.25 or 0.5MG /DOS, 2 MG/1.5ML SOPN Inject into the skin.     torsemide (DEMADEX) 10 MG tablet Take 1 tablet by mouth daily.     valsartan (DIOVAN) 160 MG tablet Take 1 tablet by mouth daily.     No current facility-administered medications on file prior to visit.    Allergies:  Allergies  Allergen Reactions   Lisinopril Swelling    Facial swelling    Vital Signs:  BP 127/66   Pulse 66   Resp 18   Ht 5' 9.5" (1.765 m)   Wt 194 lb (88 kg)   SpO2 100%   BMI 28.24 kg/m    Neurological Exam: MENTAL STATUS including orientation to time, place, person, recent and remote memory, attention span and concentration, language, and fund of knowledge is normal.  Speech is not dysarthric.  CRANIAL NERVES:  No visual field defects.  Pupils equal round and reactive to light.  Normal conjugate, extra-ocular eye movements in all directions of gaze.  No ptosis at  rest or with sustained upgaze.  Face is symmetric, facial muscles including orbicularis oculi, orbicularis oris, and buccinator is 5/5. Palate elevates symmetrically.  Tongue is midline and strength is 5/5  MOTOR:  Motor strength is 5/5 in all extremities, no muscle fatigability.  No atrophy, fasciculations or abnormal movements.  No pronator drift.  Tone is normal.    MSRs:  Reflexes are 2+/4 throughout.  SENSORY:  Intact to vibration throughout.  COORDINATION/GAIT:  Gait mildly antalgic and stable, unassisted.   Data: CT chest w contrast 09/04/2016: 1. No mediastinal mass or adenopathy. 2. No infiltrate or pulmonary  edema. 3. Degenerative changes thoracic spine. 4. Atherosclerotic calcifications of thoracic aorta. 5. Mild fatty infiltration of the liver.     Labs 07/27/2016:  AChR binding 22.9*, blocking 32*, modulating 86*  IMPRESSION/PLAN: Seropositive myasthenia gravis (diagnosed 2017) without exacerbation, thymoma negative.  Clinically, he is doing great and there is no manifestation of disease.  Instructions were given to slowly taper prednisone down to 18mg /d x 1 month, then continue to reduce by 1mg  each month.  He knows to monitor for signs of breakthrough weakness  Reduce mestinon to 30mg  BID to see if this will improve leg cramps  Patient requested to send an update in 6 weeks  Return to clinic in 4 months.   Thank you for allowing me to participate in patient's care.  If I can answer any additional questions, I would be pleased to do so.    Sincerely,    Jeriko Kowalke K. , DO

## 2022-07-05 ENCOUNTER — Encounter: Payer: Self-pay | Admitting: Neurology

## 2022-07-05 ENCOUNTER — Ambulatory Visit: Payer: Medicare PPO | Admitting: Neurology

## 2022-07-05 VITALS — BP 127/66 | HR 66 | Resp 18 | Ht 69.5 in | Wt 194.0 lb

## 2022-07-05 DIAGNOSIS — G7000 Myasthenia gravis without (acute) exacerbation: Secondary | ICD-10-CM

## 2022-07-05 MED ORDER — PREDNISONE 1 MG PO TABS: ORAL_TABLET | ORAL | 3 refills | Status: DC

## 2022-07-05 MED ORDER — PREDNISONE 5 MG PO TABS: ORAL_TABLET | ORAL | 3 refills | Status: DC

## 2022-07-05 MED ORDER — PYRIDOSTIGMINE BROMIDE 60 MG PO TABS: 30.0000 mg | ORAL_TABLET | Freq: Two times a day (BID) | ORAL | 3 refills | Status: DC

## 2022-07-05 NOTE — Patient Instructions (Signed)
We will reduce prednisone to 18mg  per month x 1 month (Take 15mg  + 3mg  = 18mg  per day x 1 month), then reduce by 1mg  each month.   Reduce mestinon to 30mg  (half tablet) twice daily.  Send me a update in 6 weeks  Return to clinic 4 months

## 2022-08-09 ENCOUNTER — Encounter: Payer: Self-pay | Admitting: Neurology

## 2022-08-16 ENCOUNTER — Ambulatory Visit (INDEPENDENT_AMBULATORY_CARE_PROVIDER_SITE_OTHER): Payer: Medicare PPO

## 2022-08-16 ENCOUNTER — Ambulatory Visit: Payer: Medicare PPO | Admitting: Orthopaedic Surgery

## 2022-08-16 ENCOUNTER — Ambulatory Visit: Payer: Self-pay

## 2022-08-16 VITALS — BP 102/55 | HR 76

## 2022-08-16 DIAGNOSIS — M19011 Primary osteoarthritis, right shoulder: Secondary | ICD-10-CM

## 2022-08-16 DIAGNOSIS — M1812 Unilateral primary osteoarthritis of first carpometacarpal joint, left hand: Secondary | ICD-10-CM | POA: Diagnosis not present

## 2022-08-16 DIAGNOSIS — M180 Bilateral primary osteoarthritis of first carpometacarpal joints: Secondary | ICD-10-CM | POA: Diagnosis not present

## 2022-08-16 DIAGNOSIS — M19012 Primary osteoarthritis, left shoulder: Secondary | ICD-10-CM | POA: Diagnosis not present

## 2022-08-16 DIAGNOSIS — M1811 Unilateral primary osteoarthritis of first carpometacarpal joint, right hand: Secondary | ICD-10-CM

## 2022-08-16 NOTE — Progress Notes (Signed)
   Procedure Note  Patient: Jonathan Mcgee             Date of Birth: 02-22-1951           MRN: [id number]             Visit Date: 08/16/2022  Procedures: Visit Diagnoses:  1. Primary osteoarthritis of left shoulder   2. Primary osteoarthritis, right shoulder   3. Primary osteoarthritis of first carpometacarpal joint of right hand   4. Primary osteoarthritis of first carpometacarpal joint of left hand     US-guided glenohumeral joint injection, Left shoulder After discussion on risks/benefits/indications, informed verbal consent was obtained. A timeout was then performed. The patient was positioned lying lateral recumbent on examination table. The patient's shoulder was prepped with betadine and multiple alcohol swabs and utilizing ultrasound guidance, the patient's glenohumeral joint was identified on ultrasound. Using ultrasound guidance a 22-gauge, 3.5 inch needle with a mixture of 2:2:2 cc's lidocaine:bupivicaine:depomedrol was directed from a lateral to medial direction via in-plane technique into the glenohumeral joint with visualization of appropriate spread of injectate into the joint. Patient tolerated the procedure well without immediate complications.   - I evaluated the patient about 10 minutes post-injection and they had improvement in pain and range of motion.  States he does not feel his normal "discomfort" - follow-up with Dr. Erlinda Hong as indicated; I am happy to see them as needed -If he finds improvement from this, did recommend he may schedule a shoulder injection for the contralateral shoulder in the next 2-3 weeks  Elba Barman, DO [facility]  This note was dictated using Dragon naturally speaking software and may contain errors in syntax, spelling, or content which have not been identified prior to signing this note.

## 2022-08-16 NOTE — Progress Notes (Signed)
Office Visit Note   Patient: Jonathan Mcgee           Date of Birth: 08/07/1951           MRN: [id number] Visit Date: 08/16/2022              Requested by: Jola Baptist, PA-C [place],  [place] [address] PCP: Jola Baptist, PA-C   Assessment & Plan: Visit Diagnoses:  1. Primary osteoarthritis of left shoulder   2. Primary osteoarthritis, right shoulder   3. Primary osteoarthritis of first carpometacarpal joint of right hand   4. Primary osteoarthritis of first carpometacarpal joint of left hand     Plan: Impression is bilateral shoulder osteoarthritis and bilateral thumb CMC osteoarthritis.  Treatment options were reviewed and he would like to start with a glenohumeral injection on the left shoulder first.  Patient is diabetic so we will space these injections out every couple weeks to make sure that his sugars do not go too high.  We will order the shot with Dr. Rolena Infante and then he can follow-up with Dr. Rolena Infante a few weeks after the left shoulder to get the right shoulder injected.  Follow-Up Instructions: No follow-ups on file.   Orders:  Orders Placed This Encounter  Procedures   XR Hand Complete Left   XR Hand Complete Right   No orders of the defined types were placed in this encounter.     Procedures: No procedures performed   Clinical Data: No additional findings.   Subjective: Chief Complaint  Patient presents with   Right Shoulder - Pain   Left Shoulder - Pain   Right Hand - Pain   Left Hand - Pain    HPI Patient is a 71 year old gentleman here for evaluation of bilateral shoulder and bilateral hand pain.  Has rheumatoid arthritis.  Feels weakness in his hands.  Dr. Benjamine Mola referred him back to [place] for further evaluation.  He has difficulty lifting due to his shoulders and pain in his hands.  He is diabetic.  Review of Systems  Constitutional: Negative.   All other systems reviewed and are  negative.    Objective: Vital Signs: There were no vitals taken for this visit.  Physical Exam Vitals and nursing note reviewed.  Constitutional:      Appearance: He is well-developed.  HENT:     Head: Normocephalic and atraumatic.  Eyes:     Pupils: Pupils are equal, round, and reactive to light.  Pulmonary:     Effort: Pulmonary effort is normal.  Abdominal:     Palpations: Abdomen is soft.  Musculoskeletal:        General: Normal range of motion.     Cervical back: Neck supple.  Skin:    General: Skin is warm.  Neurological:     Mental Status: He is alert and oriented to person, place, and time.  Psychiatric:        Behavior: Behavior normal.        Thought Content: Thought content normal.        Judgment: Judgment normal.     Ortho Exam Examination of bilateral shoulders shows pain and crepitus and mild guarding to range of motion.  Strength is slightly decreased secondary to the pain and guarding.  Examination of bilateral thumbs show pain and crepitus with grind test.  There is no triggering.  No numbness or tingling. Specialty Comments:  No specialty comments available.  Imaging: No results  found.   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/29/2021   DISH (diffuse idiopathic skeletal hyperostosis) 11/29/2020   Primary osteoarthritis of right hip 11/14/2020   Rheumatoid arthritis ([place]) 09/23/2019   Morbid obesity ([place]) 07/19/2019   UTI (urinary tract infection) 02/20/2019   Suspected COVID-19 virus infection 02/20/2019   Type 2 diabetes mellitus ([place]) 02/20/2019   Sepsis ([place]) 02/19/2019   CKD (chronic kidney disease) 04/12/2017   Essential hypertension 01/13/2017   Hyperlipidemia 01/13/2017   Myasthenia gravis ([place]) 08/28/2016   Past Medical History:  Diagnosis Date   Arthritis     Diabetes mellitus without complication (HCC)    Hypercholesterolemia    Hypertension    Myasthenia gravis ([place])    Rheumatoid arthritis ([place])     Family History  Problem Relation Age of Onset   Diabetes Mother    Heart disease Mother    Hyperlipidemia Mother    Heart attack Mother    Heart murmur Mother    Hyperlipidemia Father    Kidney failure Father    Drug abuse Maternal Grandmother    Heart disease Maternal Grandmother    Hypertension Maternal Grandmother    Hyperlipidemia Maternal Grandmother    Diabetes Maternal Grandmother     Past Surgical History:  Procedure Laterality Date   CATARACT EXTRACTION, BILATERAL  2020   KNEE SURGERY Right    fractured patella > 20 yrs ago   PENILE PROSTHESIS IMPLANT  2015   TOTAL HIP ARTHROPLASTY Right 01/29/2021   Procedure: RIGHT TOTAL HIP ARTHROPLASTY ANTERIOR APPROACH;  Surgeon: Leandrew Koyanagi, MD;  Location: [facility];  Service: Orthopedics;  Laterality: Right;   Social History   Occupational History    Comment: [employer]  Tobacco Use   Smoking status: Some Days    Types: Cigars   Smokeless tobacco: Never  Vaping Use   Vaping Use: Never used  Substance and Sexual Activity   Alcohol use: Yes    Comment: Rarely   Drug use: No   Sexual activity: Not on file

## 2022-09-13 ENCOUNTER — Ambulatory Visit: Payer: Medicare PPO | Admitting: Sports Medicine

## 2022-09-16 ENCOUNTER — Ambulatory Visit: Payer: Medicare PPO | Admitting: Sports Medicine

## 2022-10-15 ENCOUNTER — Ambulatory Visit: Payer: Medicare PPO | Admitting: Diagnostic Neuroimaging

## 2022-11-11 ENCOUNTER — Ambulatory Visit: Payer: Medicare PPO | Admitting: Neurology

## 2022-11-11 ENCOUNTER — Encounter: Payer: Self-pay | Admitting: Neurology

## 2022-11-11 VITALS — BP 148/64 | HR 81 | Ht 70.0 in | Wt 198.4 lb

## 2022-11-11 DIAGNOSIS — R200 Anesthesia of skin: Secondary | ICD-10-CM | POA: Diagnosis not present

## 2022-11-11 DIAGNOSIS — G7000 Myasthenia gravis without (acute) exacerbation: Secondary | ICD-10-CM | POA: Diagnosis not present

## 2022-11-11 MED ORDER — PREDNISONE 1 MG PO TABS: ORAL_TABLET | ORAL | 3 refills | Status: DC

## 2022-11-11 NOTE — Patient Instructions (Signed)
Reduce prednisone to 14mg  daily x 1 month, then continue to reduce by 1mg  each month.  Nerve of the hands.  Return to clinic in April  ELECTROMYOGRAM AND NERVE CONDUCTION STUDIES (EMG/NCS) INSTRUCTIONS  How to Prepare The neurologist conducting the EMG will need to know if you have certain medical conditions. Tell the neurologist and other EMG lab personnel if you: Have a pacemaker or any other electrical medical device Take blood-thinning medications Have hemophilia, a blood-clotting disorder that causes prolonged bleeding Bathing Take a shower or bath shortly before your exam in order to remove oils from your skin. Don't apply lotions or creams before the exam.  What to Expect You'll likely be asked to change into a hospital gown for the procedure and lie down on an examination table. The following explanations can help you understand what will happen during the exam.  Electrodes. The neurologist or a technician places surface electrodes at various locations on your skin depending on where you're experiencing symptoms. Or the neurologist may insert needle electrodes at different sites depending on your symptoms.  Sensations. The electrodes will at times transmit a tiny electrical current that you may feel as a twinge or spasm. The needle electrode may cause discomfort or pain that usually ends shortly after the needle is removed. If you are concerned about discomfort or pain, you may want to talk to the neurologist about taking a short break during the exam.  Instructions. During the needle EMG, the neurologist will assess whether there is any spontaneous electrical activity when the muscle is at rest - activity that isn't present in healthy muscle tissue - and the degree of activity when you slightly contract the muscle.  He or she will give you instructions on resting and contracting a muscle at appropriate times. Depending on what muscles and nerves the neurologist is examining, he or she  may ask you to change positions during the exam.  After your EMG You may experience some temporary, minor bruising where the needle electrode was inserted into your muscle. This bruising should fade within several days. If it persists, contact your primary care doctor.

## 2022-11-11 NOTE — Progress Notes (Signed)
Follow-up Visit   Date: 11/11/22   Jonathan Mcgee MRN: [id number] DOB: May 04, 1951   Interim History: Jonathan Mcgee is a 71 y.o. left-handed [place] male with diabetes mellitus, hypertension, hyperlipidemia, and RA returning to the clinic for follow-up of myasthenia gravis.  The patient was accompanied to the clinic by self.  History of present illness: Patient was diagnosed with seropositive myasthenia gravis in 2017 after presenting with double vision, mild droopiness of the eyes, and mild slurred speech.  He has been followed by Dr. Marjory Lies. He was started on mestinon 60mg  three times daily and has been on prednisone 20mg /d.  He has never been hospitalized with MG crisis. He was hospitalized due to infection after starting azathioprine in 2020.  Due to leg weakness, he was managed with induction dose of IVIG in 2020 and again in the fall of 2021.  It is not clear whether he has a significant benefit after IVIG.  In the fall of 2021, he began having leg weakness, imbalance, difficulty with walking.  For this reason, he was given a course of IVIG and prednisone was increased to 60mg /d.   Of note, patient also has rheumatoid arthritis and takes methotexate for this prescribed by rheumatology (Dr. 2022).    UPDATE 07/05/2022:  He was last seen in 2021 for second opinion of MG and now transferring care.  He has been on prednisone 20mg  and mestinon 60mg  twice daily for the past year.  He denies double vision, droopiness of the eyelids, difficulty swallowing/talking, or limb weakness.  He also takes mestinon 60mg  BID but reports cramps and muscle spasms with this.   He is with another rheumatologist and no longer on methotrexate.   UPDATE 11/11/2022:  From a myasthenia standpoint, he is doing well and has been able to reduce prednisone to 15mg  daily.  For the past several months, he has constant numbness of the left hand.  He has episodic burning and shooting pain in the hand.  He  denies neck pain or shooting pain down the arm.  To a lesser degree, he has similar symptoms in the right hand.  He has weakness and is dropping things in the left hand. He also endorses having a lot of left shoulder pain.   Medications:  Current Outpatient Medications on File Prior to Visit  Medication Sig Dispense Refill   amLODipine (NORVASC) 5 MG tablet TAKE 1 & 1/2 (ONE & ONE-HALF) TABLETS BY MOUTH ONCE DAILY 135 tablet 0   atorvastatin (LIPITOR) 20 MG tablet Take 1 tablet by mouth daily.     labetalol (NORMODYNE) 100 MG tablet Take 1 tablet by mouth 2 (two) times daily.     metoprolol succinate (TOPROL-XL) 25 MG 24 hr tablet Take 1 tablet by mouth daily.     predniSONE (DELTASONE) 1 MG tablet Take 15mg  + 3mg  = 18mg  per day x 1 month, then reduce by 1mg  each month. 210 tablet 3   predniSONE (DELTASONE) 5 MG tablet Take 15mg  + 3mg  = 18mg  per day x 1 month, then reduce by 1mg  each month. 210 tablet 3   pregabalin (LYRICA) 50 MG capsule Take 50 mg by mouth 2 (two) times daily.     pyridostigmine (MESTINON) 60 MG tablet Take 0.5 tablets (30 mg total) by mouth 2 (two) times daily. 270 tablet 3   Semaglutide,0.25 or 0.5MG /DOS, 2 MG/1.5ML SOPN Inject into the skin.     torsemide (DEMADEX) 10 MG tablet Take 1 tablet by mouth daily.  valsartan (DIOVAN) 160 MG tablet Take 1 tablet by mouth daily.     No current facility-administered medications on file prior to visit.    Allergies:  Allergies  Allergen Reactions   Lisinopril Swelling    Facial swelling    Vital Signs:  BP (!) 148/64   Pulse 81   Ht 5\' 10"  (1.778 m)   Wt 198 lb 6.4 oz (90 kg)   SpO2 96%   BMI 28.47 kg/m    Neurological Exam: MENTAL STATUS including orientation to time, place, person, recent and remote memory, attention span and concentration, language, and fund of knowledge is normal.  Speech is not dysarthric.  CRANIAL NERVES:   Pupils equal round and reactive to light.  Normal conjugate, extra-ocular eye  movements in all directions of gaze.  No ptosis at rest or with sustained upgaze.  Face is symmetric, facial muscles including orbicularis oculi, orbicularis oris, and buccinator is 5/5. Palate elevates symmetrically.  Tongue is midline and strength is 5/5  MOTOR:  Motor strength is 5/5 in all extremities, no muscle fatigability.  No atrophy, fasciculations or abnormal movements.  No pronator drift.  Tone is normal.    MSRs:  Reflexes are 2+/4 throughout. Negative Tinel's at the wrists bilaterally.   SENSORY:  Intact to vibration and temperature throughout.  COORDINATION/GAIT:  Gait is stable, unassisted.   Data: CT chest w contrast 09/04/2016: 1. No mediastinal mass or adenopathy. 2. No infiltrate or pulmonary edema. 3. Degenerative changes thoracic spine. 4. Atherosclerotic calcifications of thoracic aorta. 5. Mild fatty infiltration of the liver.  Labs 10/07/2022:  HbA1c 7.5    Labs 07/27/2016:  AChR binding 22.9*, blocking 32*, modulating 86*  IMPRESSION/PLAN: Seropositive myasthenia gravis (diagnosed 2017) without exacerbation, thymoma negative.  Clinically, he is doing great and there is no evidence of disease manifestation.  He has been tolerating tapering dose of prednisone.  He opted to keep prednisone at 15mg  to help his RA symptoms, but I explained that if his RA gets worse going down on prednsone, then he needs to see a rheumatologist to manage prednisone and/or immunosuppression, because from a MG standpoint, he seems to be doing really well.    - Reduce prednisone to 14mg /d and continue to taper by 1mg /d  - Continue mestinon 60mg  twice daily  2.  Bilateral hand numbness, worse on the left.  ?Entrapment neuropathy, less likely cervical radiculopathy.   - NCS/EMG of the arms  Return to clinic in 4 months  Thank you for allowing me to participate in patient's care.  If I can answer any additional questions, I would be pleased to do so.    Sincerely,    Caryn Gienger K. 2018,  DO

## 2022-12-15 ENCOUNTER — Other Ambulatory Visit: Payer: Self-pay | Admitting: Diagnostic Neuroimaging

## 2022-12-19 ENCOUNTER — Encounter: Payer: Self-pay | Admitting: Neurology

## 2022-12-20 ENCOUNTER — Other Ambulatory Visit: Payer: Self-pay | Admitting: Neurology

## 2022-12-20 DIAGNOSIS — G7000 Myasthenia gravis without (acute) exacerbation: Secondary | ICD-10-CM

## 2022-12-20 MED ORDER — PYRIDOSTIGMINE BROMIDE 60 MG PO TABS: 30.0000 mg | ORAL_TABLET | Freq: Two times a day (BID) | ORAL | 3 refills | Status: DC

## 2022-12-26 ENCOUNTER — Ambulatory Visit: Payer: Medicare PPO | Admitting: Neurology

## 2023-03-17 ENCOUNTER — Ambulatory Visit: Payer: Medicare PPO | Admitting: Neurology

## 2023-03-17 ENCOUNTER — Encounter: Payer: Self-pay | Admitting: Neurology

## 2023-03-17 VITALS — BP 155/64 | HR 86 | Ht 70.0 in | Wt 195.0 lb

## 2023-03-17 DIAGNOSIS — G7000 Myasthenia gravis without (acute) exacerbation: Secondary | ICD-10-CM

## 2023-03-17 MED ORDER — PREDNISONE 10 MG PO TABS: ORAL_TABLET | ORAL | 1 refills | Status: AC

## 2023-03-17 NOTE — Patient Instructions (Signed)
Continue prednisone  daily  Stop mestinon

## 2023-03-17 NOTE — Progress Notes (Signed)
Follow-up Visit   Date: 03/17/23   Jonathan Mcgee MRN: [id number] DOB: February 24, 1951   Interim History: Jonathan Mcgee is a 72 y.o. left-handed [place] male with diabetes mellitus, hypertension, hyperlipidemia, and RA returning to the clinic for follow-up of myasthenia gravis.  The patient was accompanied to the clinic by self.  IMPRESSION/PLAN: Seropositive myasthenia gravis (diagnosed 2017) without exacerbation, thymoma negative.  Clinically he has been doing great, with no evidence of disease manifestation.  We tried a slow prednisone taper and at  he started having double vision, so he self-adjusted it to  daily.  He was on mestinon but developed cramps.  Fortunately, he has been doing well off mestinon.    - Continue prednisone  daily  - He will be transitioning care to [facility] and has an appointment in September  2.   Bilateral hand paresthesias, most suggestive of carpal tunnel syndrome - He will be getting NCS/EMG at an other facility  Return to clinic as needed  ------------------------------------------------------ History of present illness: Patient was diagnosed with seropositive myasthenia gravis in 2017 after presenting with double vision, mild droopiness of the eyes, and mild slurred speech.  He has been followed by Dr. Marjory Lies. He was started on mestinon  three times daily and has been on prednisone /d.  He has never been hospitalized with MG crisis. He was hospitalized due to infection after starting azathioprine in 2020.  Due to leg weakness, he was managed with induction dose of IVIG in 2020 and again in the fall of 2021.  It is not clear whether he has a significant benefit after IVIG.  In the fall of 2021, he began having leg weakness, imbalance, difficulty with walking.  For this reason, he was given a course of IVIG and prednisone was increased to /d.   Of note, patient also has rheumatoid arthritis and takes methotexate  for this prescribed by rheumatology (Dr. Nickola Major).    UPDATE 07/05/2022:  He was last seen in 2021 for second opinion of MG and now transferring care.  He has been on prednisone  and mestinon  twice daily for the past year.  He denies double vision, droopiness of the eyelids, difficulty swallowing/talking, or limb weakness.  He also takes mestinon  BID but reports cramps and muscle spasms with this.   He is with another rheumatologist and no longer on methotrexate.   UPDATE 11/11/2022:  From a myasthenia standpoint, he is doing well and has been able to reduce prednisone to  daily.  For the past several months, he has constant numbness of the left hand.  He has episodic burning and shooting pain in the hand.  He denies neck pain or shooting pain down the arm.  To a lesser degree, he has similar symptoms in the right hand.  He has weakness and is dropping things in the left hand. He also endorses having a lot of left shoulder pain.   UPDATE 03/17/2023:  He was able to reduce prednisone down to , but then developed double vision so self-adjusted his prednisone back to  daily.  Since being on prednisone  for the past month, he has not had any problems.  He has been off mestinon for the past 2 months because he was unable to get medication refilled and had issues between my office and the pharmacy.  He reports no longer having cramps or muscle spasms.    He had burning sensation in the thumb, index finger, and middle  finger.  His PCP started him on Cymbalta  daily.  This has helped burning pain.   He will be getting NCS/EMG at another office.  He expresses his frustration with office policies and will be transferring care to [facility].   Medications:  Current Outpatient Medications on File Prior to Visit  Medication Sig Dispense Refill   amLODipine (NORVASC) 5 MG tablet TAKE 1 & 1/2 (ONE & ONE-HALF) TABLETS BY MOUTH ONCE DAILY 135 tablet 0   atorvastatin (LIPITOR) 20 MG tablet  Take 1 tablet by mouth daily.     labetalol (NORMODYNE) 100 MG tablet Take 1 tablet by mouth 2 (two) times daily.     metoprolol succinate (TOPROL-XL) 25 MG 24 hr tablet Take 1 tablet by mouth daily.     predniSONE (DELTASONE) 1 MG tablet Take  +  =  per day x 1 month, then reduce by  each month. 210 tablet 3   predniSONE (DELTASONE) 5 MG tablet Take  +  =  per day x 1 month, then reduce by  each month. 210 tablet 3   pregabalin (LYRICA) 50 MG capsule Take 50 mg by mouth 2 (two) times daily.     pyridostigmine (MESTINON) 60 MG tablet Take 0.5 tablets (30 mg total) by mouth 2 (two) times daily. 270 tablet 3   Semaglutide,0.25 or 0.5MG /DOS, 2 MG/1.5ML SOPN Inject into the skin.     torsemide (DEMADEX) 10 MG tablet Take 1 tablet by mouth daily.     valsartan (DIOVAN) 160 MG tablet Take 1 tablet by mouth daily.     No current facility-administered medications on file prior to visit.    Allergies:  Allergies  Allergen Reactions   Lisinopril Swelling    Facial swelling    Vital Signs:  BP (!) 156/65   Pulse 86   Ht  (1.778 m)   Wt 195 lb (88.5 kg)   SpO2 94%   BMI 27.98 kg/m    Neurological Exam: MENTAL STATUS including orientation to time, place, person, recent and remote memory, attention span and concentration, language, and fund of knowledge is normal.  Speech is not dysarthric.  CRANIAL NERVES:   Pupils equal round and reactive to light.  Normal conjugate, extra-ocular eye movements in all directions of gaze.  No ptosis at rest or with sustained upgaze.  Face is symmetric, facial muscles including orbicularis oculi, orbicularis oris, and buccinator is 5/5. Palate elevates symmetrically.  Tongue is midline and strength is 5/5  MOTOR:  Motor strength is 5/5 in all extremities, no muscle fatigability.  No atrophy, fasciculations or abnormal movements.  No pronator drift.  Tone is normal.    COORDINATION/GAIT:  Gait is stable, unassisted.    Data: CT chest w contrast 09/04/2016: 1. No mediastinal mass or adenopathy. 2. No infiltrate or pulmonary edema. 3. Degenerative changes thoracic spine. 4. Atherosclerotic calcifications of thoracic aorta. 5. Mild fatty infiltration of the liver.  Labs 10/07/2022:  HbA1c 7.5    Labs 07/27/2016:  AChR binding 22.9*, blocking 32*, modulating 86*   Thank you for allowing me to participate in patient's care.  If I can answer any additional questions, I would be pleased to do so.    Sincerely,    Brookelle Pellicane K. Allena Katz, DO

## 2023-06-24 ENCOUNTER — Ambulatory Visit (INDEPENDENT_AMBULATORY_CARE_PROVIDER_SITE_OTHER): Payer: Medicare PPO

## 2023-06-24 ENCOUNTER — Ambulatory Visit
Admission: EM | Admit: 2023-06-24 | Discharge: 2023-06-24 | Disposition: A | Discharge status: Home / Self Care | Payer: Medicare PPO | Attending: Emergency Medicine | Admitting: Emergency Medicine

## 2023-06-24 DIAGNOSIS — J189 Pneumonia, unspecified organism: Secondary | ICD-10-CM | POA: Diagnosis not present

## 2023-06-24 DIAGNOSIS — R059 Cough, unspecified: Secondary | ICD-10-CM

## 2023-06-24 HISTORY — DX: Carpal tunnel syndrome, unspecified upper limb: G56.00

## 2023-06-24 MED ORDER — AMOXICILLIN-POT CLAVULANATE 875-125 MG PO TABS: 1.0000 | ORAL_TABLET | Freq: Two times a day (BID) | ORAL | 0 refills | Status: AC

## 2023-06-24 MED ORDER — BENZONATATE 200 MG PO CAPS: 200.0000 mg | ORAL_CAPSULE | Freq: Three times a day (TID) | ORAL | 0 refills | Status: AC | PRN

## 2023-06-24 MED ORDER — ALBUTEROL SULFATE HFA 108 (90 BASE) MCG/ACT IN AERS: 1.0000 | INHALATION_SPRAY | RESPIRATORY_TRACT | 0 refills | Status: AC | PRN

## 2023-06-24 MED ORDER — ACETAMINOPHEN 325 MG PO TABS
650.0000 mg | ORAL_TABLET | Freq: Once | ORAL | Status: AC
  Administered 2023-06-24: 650 mg via ORAL

## 2023-06-24 MED ORDER — IPRATROPIUM-ALBUTEROL 0.5-2.5 (3) MG/3ML IN SOLN
3.0000 mL | Freq: Once | RESPIRATORY_TRACT | Status: AC
  Administered 2023-06-24: 3 mL via RESPIRATORY_TRACT

## 2023-06-24 MED ORDER — AEROCHAMBER MV MISC: 1 refills | Status: AC

## 2023-06-24 MED ORDER — DOXYCYCLINE HYCLATE 100 MG PO CAPS: 100.0000 mg | ORAL_CAPSULE | Freq: Two times a day (BID) | ORAL | 0 refills | Status: AC

## 2023-06-24 NOTE — ED Provider Notes (Signed)
HPI  SUBJECTIVE:  Jonathan Mcgee is a 72 y.o. male who presents with 5 days of dry cough, fatigue, shortness of breath, clear rhinorrhea, occasional postnasal drip.  He reports intermittent lightheadedness that lasts a minute or 2, but this is not new or different.  No chest pain, palpitations, syncope or seizures.  No fevers, body aches, headaches, nasal congestion, wheezing, chest pain.  No change in his baseline dyspnea on exertion.  He denies any signs or symptoms of a myasthenia gravis flare.  No weakness in his extremities, respiratory fatigue.  No antibiotics in the past 3 months.  No antipyretic in the past 6 hours.  He has difficulty sleeping secondary to the cough.  He has tried water, rest and Robitussin.  The Robitussin helps temporarily.  No aggravating factors. Patient has a past medical history of myasthenia gravis, rheumatoid arthritis, hypercholesterolemia, hypertension, chronic kidney disease, diabetes, and urosepsis.  No history of pulmonary disease.  PCP: [facility].  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   Procedure: RIGHT TOTAL HIP ARTHROPLASTY ANTERIOR APPROACH;  Surgeon: Tarry Kos, MD;  Location: [facility] OR;  Service: Orthopedics;  Laterality: Right;    Family History  Problem Relation Age of Onset   Diabetes Mother    Heart disease Mother    Hyperlipidemia Mother    Heart attack Mother    Heart murmur Mother    Hyperlipidemia Father    Kidney failure Father    Drug abuse Maternal Grandmother    Heart disease Maternal Grandmother    Hypertension  Maternal Grandmother    Hyperlipidemia Maternal Grandmother    Diabetes Maternal Grandmother     Social History   Tobacco Use   Smoking status: Every Day    Types: Cigars   Smokeless tobacco: Never  Vaping Use   Vaping status: Never Used  Substance Use Topics   Alcohol use: Yes    Comment: occ   Drug use: No    No current facility-administered medications for this encounter.  Current Outpatient Medications:    albuterol (VENTOLIN HFA) 108 (90 Base) MCG/ACT inhaler, Inhale 1-2 puffs into the lungs every 4 (four) hours as needed for wheezing or shortness of breath., Disp: 1 each, Rfl: 0   amoxicillin-clavulanate (AUGMENTIN) 875-125 MG tablet, Take 1 tablet by mouth every 12 (twelve) hours for 5 days., Disp: 10 tablet, Rfl: 0   benzonatate (TESSALON) 200 MG capsule, Take 1 capsule (200 mg total) by mouth 3 (three) times daily as needed for cough., Disp: 30 capsule, Rfl: 0   doxycycline (VIBRAMYCIN) 100 MG capsule, Take 1 capsule (100 mg total) by mouth 2 (two) times daily for 5 days., Disp: 10 capsule, Rfl: 0   Spacer/Aero-Holding Chambers (AEROCHAMBER MV) inhaler, Use as instructed, Disp: 1 each, Rfl: 1   amLODipine (NORVASC) 5 MG tablet, TAKE 1 & 1/2 (ONE & ONE-HALF) TABLETS BY MOUTH ONCE DAILY, Disp: 135 tablet, Rfl: 0   atorvastatin (LIPITOR) 20 MG tablet, Take 1 tablet by mouth daily., Disp: , Rfl:  DULoxetine (CYMBALTA) 30 MG capsule, Take 30 mg by mouth daily., Disp: , Rfl:    labetalol (NORMODYNE) 100 MG tablet, Take 1 tablet by mouth 2 (two) times daily., Disp: , Rfl:    metoprolol succinate (TOPROL-XL) 25 MG 24 hr tablet, Take 1 tablet by mouth daily., Disp: , Rfl:    predniSONE (DELTASONE) 10 MG tablet, Take 10mg  daily., Disp: 90 tablet, Rfl: 1   pregabalin (LYRICA) 50 MG capsule, Take 50 mg by mouth 2 (two) times daily. (Patient not taking: Reported on 03/17/2023), Disp: , Rfl:    Semaglutide,0.25 or 0.5MG /DOS, 2 MG/1.5ML SOPN, Inject into the skin., Disp: , Rfl:     torsemide (DEMADEX) 10 MG tablet, Take 1 tablet by mouth daily., Disp: , Rfl:    valsartan (DIOVAN) 160 MG tablet, Take 1 tablet by mouth daily., Disp: , Rfl:   Allergies  Allergen Reactions   Lisinopril Swelling    Facial swelling     ROS  As noted in HPI.   Physical Exam  BP 102/62 (BP Location: Right Arm)   Pulse 81   Temp 100.1 F (37.8 C) (Oral)   Resp 20   SpO2 94%   Constitutional: Well developed, well nourished, no acute distress.  Appears nontoxic. Eyes:  EOMI, conjunctiva normal bilaterally HENT: Normocephalic, atraumatic,mucus membranes moist.  Positive nasal congestion.  No sinus tenderness. Respiratory: Normal inspiratory effort.  Rhonchi, wheezing right side.  Fair air movement. Cardiovascular: Normal rate, regular rhythm, no murmurs rubs or gallops GI: nondistended skin: No rash, skin intact Musculoskeletal: no deformities Neurologic: Alert & oriented x 3, no focal neuro deficits Psychiatric: Speech and behavior appropriate   ED Course   Medications  acetaminophen (TYLENOL) tablet 650 mg (650 mg Oral Given 06/24/23 1057)  ipratropium-albuterol (DUONEB) 0.5-2.5 (3) MG/3ML nebulizer solution 3 mL (3 mLs Nebulization Given 06/24/23 1137)    Orders Placed This Encounter  Procedures   DG Chest 2 View    Standing Status:   Standing    Number of Occurrences:   1    Order Specific Question:   Reason for Exam (SYMPTOM  OR DIAGNOSIS REQUIRED)    Answer:   Cough, wheeze /ronchi  right side, fever, rule out pneumonia   Recheck vitals    Post duoneb    Standing Status:   Standing    Number of Occurrences:   1    No results found for this or any previous visit (from the past 24 hour(s)). DG Chest 2 View  Result Date: 06/24/2023 CLINICAL DATA:  Productive cough, fatigue, dizziness EXAM: CHEST - 2 VIEW COMPARISON:  Chest radiograph 02/19/2019 FINDINGS: The cardiomediastinal silhouette is normal There is no focal consolidation or pulmonary edema. There is no  pleural effusion or pneumothorax There is no acute osseous abnormality. IMPRESSION: No radiographic evidence of acute cardiopulmonary process. Electronically Signed   By: Lesia Hausen M.D.   On: 06/24/2023 12:11    ED Clinical Impression  1. Community acquired pneumonia of right lung, unspecified part of lung      ED Assessment/Plan     Outside labs reviewed.  Calculated creatinine clearance from labs done on June 06, 2023 48 mL/min  Patient presents for an acute illness with systemic symptoms of fever and hypoxia at 92%. Deferring testing for COVID as he is out of the treatment window and it would not change management.  I am concerned about a secondary pneumonia.  Checking chest x-ray.  Will give DuoNeb and reevaluate.  Reviewed imaging independently.  No radiographic evidence of acute cardiopulmonary process per radiology.  See radiology report for full details.  Chest x-ray negative for pneumonia.  However given the cough, focal lung findings and the fever, hypoxia I will treat as a pneumonia.  He does not appear to be septic at this visit.  On reevaluation, patient states that he feels better.  Louder wheezing on the right side.  Improved air movement.  Repeat vitals 102/62, heart rate 90, temp 100.1, O2 sat 96% on room air.   Will send home with Augmentin 875/125 twice daily and doxycycline 100 mg p.o. twice daily for 5 days, do not need to renally dose these, albuterol inhaler with a spacer, Tessalon.  Follow-up with PCP in several days to make sure that he is getting better.  ER return precautions given.  Discussed  imaging, MDM, treatment plan, and plan for follow-up with patient. Discussed sn/sx that should prompt return to the ED. patient agrees with plan.   Meds ordered this encounter  Medications   acetaminophen (TYLENOL) tablet 650 mg   ipratropium-albuterol (DUONEB) 0.5-2.5 (3) MG/3ML nebulizer solution 3 mL   amoxicillin-clavulanate (AUGMENTIN) 875-125 MG tablet    Sig:  Take 1 tablet by mouth every 12 (twelve) hours for 5 days.    Dispense:  10 tablet    Refill:  0   doxycycline (VIBRAMYCIN) 100 MG capsule    Sig: Take 1 capsule (100 mg total) by mouth 2 (two) times daily for 5 days.    Dispense:  10 capsule    Refill:  0   Spacer/Aero-Holding Chambers (AEROCHAMBER MV) inhaler    Sig: Use as instructed    Dispense:  1 each    Refill:  1   albuterol (VENTOLIN HFA) 108 (90 Base) MCG/ACT inhaler    Sig: Inhale 1-2 puffs into the lungs every 4 (four) hours as needed for wheezing or shortness of breath.    Dispense:  1 each    Refill:  0   benzonatate (TESSALON) 200 MG capsule    Sig: Take 1 capsule (200 mg total) by mouth 3 (three) times daily as needed for cough.    Dispense:  30 capsule    Refill:  0      *This clinic note was created using Scientist, clinical (histocompatibility and immunogenetics). Therefore, there may be occasional mistakes despite careful proofreading.  ?    Domenick Gong, MD 06/24/23 1236

## 2023-06-24 NOTE — ED Triage Notes (Signed)
Pt c/o prod cough, fatigue, "little bit" dizzy x 5 days-fever noted with vital signs-pt denies known fever-pt NAD-steady gait

## 2023-06-24 NOTE — Discharge Instructions (Signed)
Your presentation is consistent with a right-sided pneumonia.  Finish the antibiotics, even if you feel better.  2 puffs from your albuterol inhaler using your spacer every 4-6 hours as needed for coughing, wheezing, shortness of breath.  May take at 1000 mg of Tylenol 3 times a day as needed for fever.  Tessalon for the cough.  Follow-up with your primary care provider in 3 days to make sure that you are getting better.  Go to the ER for the signs and symptoms we discussed, or for any concerns
